# Patient Record
Sex: Male | Born: 1935 | Race: White | Hispanic: No | Marital: Married | Smoking: Former smoker
Health system: Southern US, Community
[De-identification: ages and names within clinical notes are randomized; demographics above are authoritative.]

## PROBLEM LIST (undated history)

## (undated) DIAGNOSIS — E785 Hyperlipidemia, unspecified: Secondary | ICD-10-CM

## (undated) DIAGNOSIS — N400 Enlarged prostate without lower urinary tract symptoms: Secondary | ICD-10-CM

## (undated) DIAGNOSIS — M170 Bilateral primary osteoarthritis of knee: Secondary | ICD-10-CM

## (undated) DIAGNOSIS — J449 Chronic obstructive pulmonary disease, unspecified: Secondary | ICD-10-CM

## (undated) DIAGNOSIS — E11311 Type 2 diabetes mellitus with unspecified diabetic retinopathy with macular edema: Secondary | ICD-10-CM

## (undated) DIAGNOSIS — M1990 Unspecified osteoarthritis, unspecified site: Secondary | ICD-10-CM

## (undated) DIAGNOSIS — R0989 Other specified symptoms and signs involving the circulatory and respiratory systems: Secondary | ICD-10-CM

## (undated) DIAGNOSIS — M25561 Pain in right knee: Secondary | ICD-10-CM

## (undated) DIAGNOSIS — M25562 Pain in left knee: Secondary | ICD-10-CM

## (undated) DIAGNOSIS — E113299 Type 2 diabetes mellitus with mild nonproliferative diabetic retinopathy without macular edema, unspecified eye: Secondary | ICD-10-CM

## (undated) DIAGNOSIS — Z85828 Personal history of other malignant neoplasm of skin: Secondary | ICD-10-CM

## (undated) DIAGNOSIS — E119 Type 2 diabetes mellitus without complications: Secondary | ICD-10-CM

## (undated) DIAGNOSIS — J310 Chronic rhinitis: Secondary | ICD-10-CM

## (undated) DIAGNOSIS — R931 Abnormal findings on diagnostic imaging of heart and coronary circulation: Secondary | ICD-10-CM

## (undated) DIAGNOSIS — N423 Unspecified dysplasia of prostate: Secondary | ICD-10-CM

## (undated) DIAGNOSIS — I6523 Occlusion and stenosis of bilateral carotid arteries: Secondary | ICD-10-CM

## (undated) DIAGNOSIS — I10 Essential (primary) hypertension: Secondary | ICD-10-CM

## (undated) DIAGNOSIS — Z961 Presence of intraocular lens: Secondary | ICD-10-CM

## (undated) DIAGNOSIS — R609 Edema, unspecified: Secondary | ICD-10-CM

## (undated) HISTORY — PX: COLECTOMY: SHX59

## (undated) HISTORY — PX: ANKLE SURGERY: SHX546

## (undated) HISTORY — PX: COLON SURGERY: SHX602

---

## 2012-07-18 ENCOUNTER — Emergency Department: Payer: Self-pay | Admitting: Internal Medicine

## 2012-07-18 LAB — URINALYSIS, COMPLETE
Bilirubin,UR: NEGATIVE
Blood: NEGATIVE
Glucose,UR: NEGATIVE mg/dL (ref 0–75)
Nitrite: NEGATIVE
Ph: 7 (ref 4.5–8.0)
Protein: NEGATIVE
RBC,UR: 1 /HPF (ref 0–5)
Specific Gravity: 1.013 (ref 1.003–1.030)
Squamous Epithelial: NONE SEEN
WBC UR: 7 /HPF (ref 0–5)

## 2012-07-18 LAB — COMPREHENSIVE METABOLIC PANEL
Anion Gap: 7 (ref 7–16)
Bilirubin,Total: 0.2 mg/dL (ref 0.2–1.0)
Chloride: 105 mmol/L (ref 98–107)
Creatinine: 1.33 mg/dL — ABNORMAL HIGH (ref 0.60–1.30)
EGFR (African American): 60 — ABNORMAL LOW
Glucose: 89 mg/dL (ref 65–99)
Osmolality: 287 (ref 275–301)
SGPT (ALT): 21 U/L (ref 12–78)
Sodium: 141 mmol/L (ref 136–145)

## 2012-07-18 LAB — CBC
MCV: 95 fL (ref 80–100)
Platelet: 174 10*3/uL (ref 150–440)
RDW: 12.6 % (ref 11.5–14.5)
WBC: 11 10*3/uL — ABNORMAL HIGH (ref 3.8–10.6)

## 2012-07-18 LAB — CK TOTAL AND CKMB (NOT AT ARMC): CK-MB: 1.9 ng/mL (ref 0.5–3.6)

## 2015-04-01 ENCOUNTER — Emergency Department: Payer: Medicare Other

## 2015-04-01 ENCOUNTER — Encounter: Payer: Self-pay | Admitting: Emergency Medicine

## 2015-04-01 ENCOUNTER — Emergency Department
Admission: EM | Admit: 2015-04-01 | Discharge: 2015-04-02 | Disposition: A | Discharge status: Home / Self Care | Payer: Medicare Other | Attending: Emergency Medicine | Admitting: Emergency Medicine

## 2015-04-01 ENCOUNTER — Emergency Department
Admission: EM | Admit: 2015-04-01 | Discharge: 2015-04-01 | Disposition: A | Discharge status: Home / Self Care | Payer: Medicare Other | Source: Home / Self Care | Attending: Emergency Medicine | Admitting: Emergency Medicine

## 2015-04-01 DIAGNOSIS — Y998 Other external cause status: Secondary | ICD-10-CM | POA: Diagnosis not present

## 2015-04-01 DIAGNOSIS — Z87891 Personal history of nicotine dependence: Secondary | ICD-10-CM | POA: Insufficient documentation

## 2015-04-01 DIAGNOSIS — Y9389 Activity, other specified: Secondary | ICD-10-CM | POA: Diagnosis not present

## 2015-04-01 DIAGNOSIS — R531 Weakness: Secondary | ICD-10-CM | POA: Diagnosis present

## 2015-04-01 DIAGNOSIS — S3992XA Unspecified injury of lower back, initial encounter: Secondary | ICD-10-CM | POA: Insufficient documentation

## 2015-04-01 DIAGNOSIS — S0990XA Unspecified injury of head, initial encounter: Secondary | ICD-10-CM | POA: Diagnosis not present

## 2015-04-01 DIAGNOSIS — E1165 Type 2 diabetes mellitus with hyperglycemia: Secondary | ICD-10-CM | POA: Insufficient documentation

## 2015-04-01 DIAGNOSIS — F0390 Unspecified dementia without behavioral disturbance: Secondary | ICD-10-CM

## 2015-04-01 DIAGNOSIS — R410 Disorientation, unspecified: Secondary | ICD-10-CM | POA: Diagnosis not present

## 2015-04-01 DIAGNOSIS — M545 Low back pain, unspecified: Secondary | ICD-10-CM

## 2015-04-01 DIAGNOSIS — W1839XA Other fall on same level, initial encounter: Secondary | ICD-10-CM | POA: Diagnosis not present

## 2015-04-01 DIAGNOSIS — Y9289 Other specified places as the place of occurrence of the external cause: Secondary | ICD-10-CM | POA: Diagnosis not present

## 2015-04-01 DIAGNOSIS — I10 Essential (primary) hypertension: Secondary | ICD-10-CM | POA: Diagnosis not present

## 2015-04-01 DIAGNOSIS — E113219 Type 2 diabetes mellitus with mild nonproliferative diabetic retinopathy with macular edema, unspecified eye: Secondary | ICD-10-CM | POA: Diagnosis not present

## 2015-04-01 DIAGNOSIS — R739 Hyperglycemia, unspecified: Secondary | ICD-10-CM

## 2015-04-01 HISTORY — DX: Other specified symptoms and signs involving the circulatory and respiratory systems: R09.89

## 2015-04-01 HISTORY — DX: Bilateral primary osteoarthritis of knee: M17.0

## 2015-04-01 HISTORY — DX: Chronic obstructive pulmonary disease, unspecified: J44.9

## 2015-04-01 HISTORY — DX: Type 2 diabetes mellitus without complications: E11.9

## 2015-04-01 HISTORY — DX: Pain in right knee: M25.561

## 2015-04-01 HISTORY — DX: Essential (primary) hypertension: I10

## 2015-04-01 HISTORY — DX: Unspecified osteoarthritis, unspecified site: M19.90

## 2015-04-01 HISTORY — DX: Occlusion and stenosis of bilateral carotid arteries: I65.23

## 2015-04-01 HISTORY — DX: Type 2 diabetes mellitus with mild nonproliferative diabetic retinopathy without macular edema, unspecified eye: E11.3299

## 2015-04-01 HISTORY — DX: Edema, unspecified: R60.9

## 2015-04-01 HISTORY — DX: Abnormal findings on diagnostic imaging of heart and coronary circulation: R93.1

## 2015-04-01 HISTORY — DX: Unspecified dysplasia of prostate: N42.30

## 2015-04-01 HISTORY — DX: Type 2 diabetes mellitus with unspecified diabetic retinopathy with macular edema: E11.311

## 2015-04-01 HISTORY — DX: Chronic rhinitis: J31.0

## 2015-04-01 HISTORY — DX: Pain in left knee: M25.562

## 2015-04-01 HISTORY — DX: Personal history of other malignant neoplasm of skin: Z85.828

## 2015-04-01 HISTORY — DX: Presence of intraocular lens: Z96.1

## 2015-04-01 HISTORY — DX: Benign prostatic hyperplasia without lower urinary tract symptoms: N40.0

## 2015-04-01 HISTORY — DX: Hyperlipidemia, unspecified: E78.5

## 2015-04-01 LAB — COMPREHENSIVE METABOLIC PANEL
ALBUMIN: 3.5 g/dL (ref 3.5–5.0)
ALT: 16 U/L — ABNORMAL LOW (ref 17–63)
AST: 20 U/L (ref 15–41)
Alkaline Phosphatase: 98 U/L (ref 38–126)
Anion gap: 10 (ref 5–15)
BUN: 29 mg/dL — AB (ref 6–20)
CO2: 29 mmol/L (ref 22–32)
Calcium: 9.1 mg/dL (ref 8.9–10.3)
Chloride: 97 mmol/L — ABNORMAL LOW (ref 101–111)
Creatinine, Ser: 1.5 mg/dL — ABNORMAL HIGH (ref 0.61–1.24)
GFR calc Af Amer: 49 mL/min — ABNORMAL LOW (ref >60)
GFR calc non Af Amer: 43 mL/min — ABNORMAL LOW (ref >60)
GLUCOSE: 519 mg/dL — AB (ref 65–99)
Potassium: 4.2 mmol/L (ref 3.5–5.1)
SODIUM: 136 mmol/L (ref 135–145)
Total Bilirubin: 1.2 mg/dL (ref 0.3–1.2)
Total Protein: 7.6 g/dL (ref 6.5–8.1)

## 2015-04-01 LAB — GLUCOSE, CAPILLARY
GLUCOSE-CAPILLARY: 274 mg/dL — AB (ref 65–99)
GLUCOSE-CAPILLARY: 315 mg/dL — AB (ref 65–99)

## 2015-04-01 LAB — URINALYSIS COMPLETE WITH MICROSCOPIC (ARMC ONLY)
BILIRUBIN URINE: NEGATIVE
Bacteria, UA: NONE SEEN
LEUKOCYTES UA: NEGATIVE
NITRITE: NEGATIVE
Protein, ur: >500 mg/dL — AB
SPECIFIC GRAVITY, URINE: 1.021 (ref 1.005–1.030)
Squamous Epithelial / LPF: NONE SEEN
pH: 6 (ref 5.0–8.0)

## 2015-04-01 LAB — CBC
HCT: 40 % (ref 40.0–52.0)
Hemoglobin: 12.7 g/dL — ABNORMAL LOW (ref 13.0–18.0)
MCH: 30.2 pg (ref 26.0–34.0)
MCHC: 31.7 g/dL — ABNORMAL LOW (ref 32.0–36.0)
MCV: 95.3 fL (ref 80.0–100.0)
Platelets: 230 10*3/uL (ref 150–440)
RBC: 4.19 MIL/uL — ABNORMAL LOW (ref 4.40–5.90)
RDW: 13.6 % (ref 11.5–14.5)
WBC: 11.6 10*3/uL — AB (ref 3.8–10.6)

## 2015-04-01 MED ORDER — INSULIN ASPART 100 UNIT/ML ~~LOC~~ SOLN
10.0000 [IU] | Freq: Once | SUBCUTANEOUS | Status: AC
  Administered 2015-04-01: 10 [IU] via INTRAVENOUS
  Filled 2015-04-01: qty 10

## 2015-04-01 MED ORDER — SODIUM CHLORIDE 0.9 % IV BOLUS (SEPSIS): 1000.0000 mL | Freq: Once | INTRAVENOUS | Status: DC

## 2015-04-01 MED ORDER — SODIUM CHLORIDE 0.9 % IV BOLUS (SEPSIS)
1000.0000 mL | Freq: Once | INTRAVENOUS | Status: AC
  Administered 2015-04-01: 1000 mL via INTRAVENOUS

## 2015-04-01 NOTE — ED Notes (Signed)
Pt's wife at bedside. Per patient's wife pt sat on the toilet all night. Pt's wife states approx 1 yr ago he went in for surgery and "this big can of worms was opened". Pt's wife states that pt was dx with dementia and since then "has gone down hill". Pt's wife states that he has not had any of his medications because she has not been giving them to him, wife states "he was a very private man who had his own routines". Pt's wife also states that patient is incontinent but refuses to wear depends. Pt calm and cooperative at this time.

## 2015-04-01 NOTE — ED Notes (Signed)
Pt presents via [facility]. Per EMS they were called out for a failure to thrive and altered mental status. EMS states when they arrived pt was sitting on toilet. EMS states that per wife pt has been there all night and has had worsening dementia. Pt is a poor historian at this time. Pt states that he was on the toilet "for a while". Pt has 18G L AC, CBG 488. EMS states that pt's wife told her that he had hx of dementia but was able to take all of his own meds.

## 2015-04-01 NOTE — ED Provider Notes (Signed)
[facility] Emergency Department Provider Note   ____________________________________________  Time seen: Approximately 10 PM I have reviewed the triage vital signs and the triage nursing note.  HISTORY  Chief Complaint Weakness   Historian Limited, patient is poor historian due to dementia Wife is a poor historian but provided most of the history Friend who is a Engineer, civil (consulting) provided some history is wall  HPI Patrick Morrison is a 82 y.o. male with a history of dementia, which has been treated recently to medication noncompliance and hyperglycemia due to not taking his medications correctly, is here for the second visit of the day for what sounds like inability to walk due to back pain.  The patient was seen this morning for hyperglycemia and laboratory studies were reassuring, and after treatment glucose was down and patient was sent home with his wife and was supposed to schedule home health nurse visit tomorrow.   When the patient got home family was able to get him out of the car but he was virtually unable to stand on his own complaining of sounds like of low back pain. He was sitting in a lawn chair when their friend, a nurse, came over and attempted to help as well, but the patient was reporting severe pain when he tried to walk located probably to the low back. He does have dementia but it seemed that it was more pain and physical limitation that was stopping him being able to get into the house rather than the dementia or altered mental status.  He has had multiple falls over the past several weeks including at least 2 that did involve a head injury and he was not ever evaluated for those.  It's unclear to me whether or not there is actually a new change and altered mental status, or the dementia has been present crusting to the point where it wasn't obvious. However it is true that the multiple falls are new over the past 3 weeks.    Past Medical History   Diagnosis Date  . DM (diabetes mellitus) (HCC)   . BPH (benign prostatic hyperplasia)   . Dysplasia of prostate   . Chronic rhinitis   . Osteoarthritis   . Edema   . COPD (chronic obstructive pulmonary disease) (HCC)   . Hypertension   . Hyperlipidemia   . Diabetic macular edema (HCC)   . NPDR (nonproliferative diabetic retinopathy) (HCC)   . Pseudophakia of both eyes   . Bilateral knee pain   . Primary osteoarthritis of both knees   . Bruit of right carotid artery   . Abnormal echocardiogram   . Personal history of other malignant neoplasm of skin   . Asymptomatic bilateral carotid artery stenosis     There are no active problems to display for this patient.   Past Surgical History  Procedure Laterality Date  . Ankle surgery    . Colon surgery    . Colectomy      No current outpatient prescriptions on file.  Allergies Review of patient's allergies indicates no known allergies.  History reviewed. No pertinent family history.  Social History Social History  Substance Use Topics  . Smoking status: Former Smoker    Quit date: 11/24/2003  . Smokeless tobacco: Never Used  . Alcohol Use: No    Review of Systems  Constitutional: Negative for fever. Eyes: Negative for visual changes. ENT: Negative for sore throat. Cardiovascular: Negative for chest pain. Respiratory: Negative for shortness of breath. Gastrointestinal: Negative for  abdominal pain, vomiting and diarrhea. Genitourinary: Negative for dysuria. Musculoskeletal positive for back pain. Skin: Negative for rash. Neurological: Negative for headache. 10 point Review of Systems otherwise negative ____________________________________________   PHYSICAL EXAM:  VITAL SIGNS: ED Triage Vitals  Enc Vitals Group     BP 04/01/15 2055 186/77 mmHg     Pulse Rate 04/01/15 2055 100     Resp 04/01/15 2055 20     Temp 04/01/15 2055 97.8 F (36.6 C)     Temp Source 04/01/15 2055 Oral     SpO2 04/01/15 2053 99  %     Weight 04/01/15 2055 137 lb (62.143 kg)     Height 04/01/15 2055 5\' 6"  (1.676 m)     Head Cir --      [facility] Flow --      Pain Score 04/01/15 2056 6     Pain Loc --      Pain Edu? --      Excl. in GC? --      Constitutional: Alert and confused/disoriented. Poor historian.. Well appearing and in no distress. Eyes: Conjunctivae are normal. PERRL. Normal extraocular movements. ENT   Head: Normocephalic. Scab over nasal bridge.   Nose: No congestion/rhinnorhea.   Mouth/Throat: Mucous membranes are moist.   Neck: No stridor. Cardiovascular/Chest: Normal rate, regular rhythm.  No murmurs, rubs, or gallops. Respiratory: Normal respiratory effort without tachypnea nor retractions. Breath sounds are clear and equal bilaterally. No wheezes/rales/rhonchi. Gastrointestinal: Soft. No distention, no guarding, no rebound. Nontender.    Genitourinary/rectal:Deferred Musculoskeletal: No step-offs but moderate tenderness to palpation in the mid lumbar and bilateral paraspinous musculature of the low back.  Mild squinting with range of motion and palpation of the hips. . Neurologic: No slurred speech. No facial droop. No focal motor or sensory deficits appreciated. Skin:  Skin is warm, dry and intact. No rash noted. Psychiatric: Confused, but no evidence of hallucination.  ____________________________________________   EKG I, Governor Rooks, MD, the attending physician have personally viewed and interpreted all ECGs.  84 bpm. Normal sinus rhythm. Left axis deviation. Nonspecific T-wave ____________________________________________  LABS (pertinent positives/negatives)  None  ____________________________________________  RADIOLOGY All Xrays were viewed by me. Imaging interpreted by Radiologist.  Lumbar spine: IMPRESSION: 1. No acute abnormality within the lumbar spine. 2. Dextroscoliosis with mild to moderate multilevel degenerative disc disease, greatest at L2-3. 3.  Extensive aorto bi-iliac atherosclerotic disease.  Pelvis: Bony abnormalities noted in the pelvis.  CT head without contrast: No acute intracerebral process. Age-related cerebral atrophy with mild to moderate chronic small vessel ischemic disease.  CT pelvis: Pending __________________________________________  PROCEDURES  Procedure(s) performed: None  Critical Care performed: None  ____________________________________________   ED COURSE / ASSESSMENT AND PLAN  CONSULTATIONS: Placed consultations for physical therapy and social work which will occur tomorrow.  Pertinent labs & imaging results that were available during my care of the patient were reviewed by me and considered in my medical decision making (see chart for details).  I reviewed this patient's chart including labs and physician note from earlier today, and he was treated for hyperglycemia with a plan to have home health started to help educate the wife on how to do the patient's medications as he does have some confusion and dementia.  However, it sounds like once he got home it was more obvious to the friend/nurse that the patient not only has had multiple falls recently, but he seems to be having a significant amount of low back pain or pelvis pain  that is preventing him from standing and walking.  So tonight, I am going to CT his head, and x-ray his spine and pelvis.. Given his current physical limitations and that he can hardly stand and certainly can't walk, including significant/severe dementia and inability to take his own medications, he is going to need evaluation for placement at his he is unsafe and unfit to be at home. I have placed social work and physical therapy consults which will occur tomorrow.   Patient care transferred to Dr. Manson PasseyBrown overnight at 11:30 PM. Patient will be here overnight and to the morning in order to see social work and physical therapy.  Patient / Family / Caregiver informed of  clinical course, medical decision-making process, and agree with plan.     ___________________________________________   FINAL CLINICAL IMPRESSION(S) / ED DIAGNOSES   Final diagnoses:  Dementia, without behavioral disturbance  Acute lumbar back pain              Note: This dictation was prepared with Dragon dictation. Any transcriptional errors that result from this process are unintentional   Governor Rooksebecca Alexi Dorminey, MD 04/01/15 [phone]

## 2015-04-01 NOTE — ED Provider Notes (Addendum)
[facility] Emergency Department Provider Note  Time seen: 11:11 AM  I have reviewed the triage vital signs and the nursing notes.   HISTORY  Chief Complaint No chief complaint on file.    HPI Patrick Morrison is a 82 y.o. male with a past medical history of dementia, diabetes presents to the emergency departmentfor wellness check. According to EMS report the patient has an enlarged prostate, was having frequent urination overnight. Per report the wife left him on the toilet overnight the patient could not get up by himself. They called EMS today for evaluation. Per EMS the wife states that the patient has become increasingly demented and difficult to handle at home now with increased urination as well. The patient is alert, oriented to self, location, and situation, states he was not sitting on the toilet all night, states that he had an accident due to his increased urination and his wife got mad at him and called EMS to get him out of the house. Patient denies any abdominal pain, chest pain, fever, nausea, vomiting, diarrhea. Patient has no complaints at this time. Patient's fingerstick was found to be elevated, the patient states he doses his own diabetes medications but is not sure if he has been taking them.     No past medical history on file.  There are no active problems to display for this patient.   No past surgical history on file.  No current outpatient prescriptions on file.  Allergies Review of patient's allergies indicates not on file.  No family history on file.  Social History Social History  Substance Use Topics  . Smoking status: Not on file  . Smokeless tobacco: Not on file  . Alcohol Use: Not on file    Review of Systems Constitutional: Negative for fever. Cardiovascular: Negative for chest pain. Respiratory: Negative for shortness of breath. Gastrointestinal: Negative for abdominal pain Genitourinary: Negative for dysuria.  Positive for increased urinary frequency Musculoskeletal: Negative for back pain Neurological: Negative for headache 10-point ROS otherwise negative.  ____________________________________________   PHYSICAL EXAM:  Constitutional: Alert, oriented to person, place, situation but not to time, is able to tell me January but not the correct year. Eyes: Normal exam ENT   Head: Normocephalic and atraumatic.   Mouth/Throat: Mucous membranes are moist. Cardiovascular: Normal rate, regular rhythm. No murmur Respiratory: Normal respiratory effort without tachypnea nor retractions. Breath sounds are clear and equal bilaterally. No wheezes/rales/rhonchi. Gastrointestinal: Soft and nontender. No distention. Musculoskeletal: Nontender with normal range of motion in all extremities.  Neurologic:  Normal speech and language. No gross focal neurologic deficits Skin:  Skin is warm, dry and intact.  Psychiatric: Mood and affect are normal. Speech and behavior are normal.  ____________________________________________     INITIAL IMPRESSION / ASSESSMENT AND PLAN / ED COURSE  Pertinent labs & imaging results that were available during my care of the patient were reviewed by me and considered in my medical decision making (see chart for details).  Patient presents by EMS for possible increased confusion, increased urinary frequency, patient denies any complaints. States he had an accident overnight due to his urinary frequency and believes that his wife was upset with him and called EMS. It is not clear at this time if this is true or not. Per EMS the wife stated the patient was on the commode all night as he could not get off by himself. We will check labs, urinalysis, and closely monitor in the emergency department. Patient's finger sig  blood glucose was found to be quite elevated, the patient has a history of diabetes and states he takes his own medications. The patient has a history of dementia,  and he states he cannot remember. Been taking his medications or not.  Labs within normal limits besides the hyperglycemia, urinalysis is negative. Receiving insulin and fluids at this time. Case management is currently speaking with the patient's family to help arrange for home health care. Wife is agreeable to this plan.  Patient's blood glucose has decreased to 270. Case management has discussed the options with the patient in the family, they wish to bring the patient home in use the resources provided by case management to obtain home health care. I believe this is a reasonable option given there does not appear to be any acute change more of a progressive decline for the past 3 months per the wife.  ____________________________________________   FINAL CLINICAL IMPRESSION(S) / ED DIAGNOSES  Hyperglycemia Urinary frequency Dementia  Minna Antis, MD 04/01/15 1508  Minna Antis, MD 04/01/15 [phone]

## 2015-04-01 NOTE — Discharge Instructions (Signed)
Please follow-up with the resources provided to you by case management. Please also follow-up with your primary care physician as soon as possible to discuss further care including home health care. Return to the emergency department for any personally concerning symptoms.    Hyperglycemia High blood sugar (hyperglycemia) means that the level of sugar in your blood is higher than it should be. Signs of high blood sugar include:  Feeling thirsty.  Frequent peeing (urinating).  Feeling tired or sleepy.  Dry mouth.  Vision changes.  Feeling weak.  Feeling hungry but losing weight.  Numbness and tingling in your hands or feet.  Headache. When you ignore these signs, your blood sugar may keep going up. These problems may get worse, and other problems may begin. HOME CARE  Check your blood sugars as told by your doctor. Write down the numbers with the date and time.  Take the right amount of insulin or diabetes pills at the right time. Write down the dose with date and time.  Refill your insulin or diabetes pills before running out.  Watch what you eat. Follow your meal plan.  Drink liquids without sugar, such as water. Check with your doctor if you have kidney or heart disease.  Follow your doctor's orders for exercise. Exercise at the same time of day.  Keep your doctor's appointments. GET HELP RIGHT AWAY IF:   You have trouble thinking or are confused.  You have fast breathing with fruity smelling breath.  You pass out (faint).  You have 2 to 3 days of high blood sugars and you do not know why.  You have chest pain.  You are feeling sick to your stomach (nauseous) or throwing up (vomiting).  You have sudden vision changes. MAKE SURE YOU:   Understand these instructions.  Will watch your condition.  Will get help right away if you are not doing well or get worse.   This information is not intended to replace advice given to you by your health care provider.  Make sure you discuss any questions you have with your health care provider.   Document Released: 01/02/2009 Document Revised: 03/28/2014 Document Reviewed: 11/11/2014 Elsevier Interactive Patient Education 2016 [employer].  Dementia Dementia is a word that is used to describe problems with the brain and how it works. People with dementia have memory loss. They may also have problems with thinking, speaking, or solving problems. It can affect how they act around people, how they do their job, their mood, and their personality. These changes may not show up for a long time. Family or friends may not notice problems in the early part of this disease. HOME CARE The following tips are for the person living with, or caring for, the person with dementia. Make the home safe.  Remove locks on bathroom doors.  Use childproof locks on cabinets where alcohol, cleaning supplies, or chemicals are stored.  Put outlet covers in electrical outlets.  Put in childproof locks to keep doors and windows safe.  Remove stove knobs, or put in safety knobs that shut off on their own.  Lower the temperature on water heaters.  Label medicines. Lock them in a safe place.  Keep knives, lighters, matches, power tools, and guns out of reach or in a safe place.  Remove objects that might break or can hurt the person.  Make sure lighting is good inside and outside.  Put in grab bars if needed.  Use a device that detects falls or other needs  for help. Lessen confusion.  Keep familiar objects and people around.  Use night lights or low lit (dim) lights at night.  Label objects or areas.  Use reminders, notes, or directions for daily activities or tasks.  Keep a simple routine that is the same for waking, meals, bathing, dressing, and bedtime.  Create a calm and quiet home.  Put up clocks and calendars.  Keep emergency numbers and the home address near all phones.  Help show the different times of  day. Open the curtains during the day to let light in. Speak clearly and directly.  Choose simple words and short sentences.  Use a gentle, calm voice.  Do not interrupt.  If the person has a hard time finding a word to use, give them the word or thought.  Ask 1 question at a time. Give enough time for the person to answer. Repeat the question if the person does not answer. Do things that lessen restlessness.  Provide a comfortable bed.  Have the same bedtime routine every night.  Have a regular walking and activity schedule.  Lessen naps during the day.  Do not let the person drink a lot of caffeine.  Go to events that are not overwhelming. Eat well and drink fluids.  Lessen distractions during meal times and snacks.  Avoid foods that are too hot or too cold.  Watch how the person chews and swallows. This is to make sure they do not choke. Other  Keep all vision, hearing, dental, and medical visits with the doctor.  Only give medicines as told by the doctor.  Watch the person's driving ability. Do not let the person drive if he or she cannot drive safely.  Use a program that helps find a person if they become missing. You may need to register with this program. GET HELP RIGHT AWAY IF:   A fever of 102 F (38.9 C) develops.  Confusion develops or gets worse.  Sleepiness develops or gets worse.  Staying awake is hard to do.  New behavior problems start like mood swings, aggression, and seeing things that are not there.  Problems with balance, speech, or falling develop.  Problems swallowing develop.  Any problems of another sickness develop. MAKE SURE YOU:  Understand these instructions.  Will watch his or her condition.  Will get help right away if he or she is not doing well or gets worse.   This information is not intended to replace advice given to you by your health care provider. Make sure you discuss any questions you have with your health care  provider.   Document Released: 02/18/2008 Document Revised: 05/30/2011 Document Reviewed: 08/02/2010 Elsevier Interactive Patient Education [employer] Elsevier Inc.

## 2015-04-01 NOTE — Care Management Note (Signed)
Case Management Note  Patient Details  Name: [name] MRN: [id number] Date of Birth: [birth year]  Subjective/Objective:         Spoke at length to son and spouse.They want to keep the patient at home as long as possible. I have provided them with our approved lists for [facility], as well as personal care services. They choose not to decide right now. I have told them to call their PCP, and tell him the agency they want to go with, as well as given them information for [facility], and [facility]. They have no further questions at this time. The son says he and his 3 brothers will help [name] through this. They have a plan to look at facilities together if this does not work out.           Action/Plan:   Expected Discharge Date:                  Expected Discharge Plan:     In-House Referral:     Discharge planning Services     Post Acute Care Choice:    Choice offered to:     DME Arranged:    DME Agency:     HH Arranged:    HH Agency:     Status of Service:     Medicare Important Message Given:    Date Medicare IM Given:    Medicare IM give by:    Date Additional Medicare IM Given:    Additional Medicare Important Message give by:     If discussed at Long Length of Stay Meetings, dates discussed:    Additional Comments:  Berna BueCheryl Iliyah Bui, RN 04/01/2015, 3:23 PM

## 2015-04-01 NOTE — ED Notes (Signed)
ems pt from home was seen here this morning for elevated blood sugar and dc'd home pt was not able to get back into the house from the car. pt also fell x 2 last week and was never evaluated for injuries

## 2015-04-01 NOTE — ED Notes (Signed)
Pt's family requests bed bath at this time.

## 2015-04-02 LAB — C DIFFICILE QUICK SCREEN W PCR REFLEX
C DIFFICILE (CDIFF) TOXIN: NEGATIVE
C Diff antigen: NEGATIVE
C Diff interpretation: NEGATIVE

## 2015-04-02 LAB — GLUCOSE, CAPILLARY
GLUCOSE-CAPILLARY: 416 mg/dL — AB (ref 65–99)
GLUCOSE-CAPILLARY: 372 mg/dL — AB (ref 65–99)
GLUCOSE-CAPILLARY: 324 mg/dL — AB (ref 65–99)
Glucose-Capillary: 285 mg/dL — ABNORMAL HIGH (ref 65–99)

## 2015-04-02 MED ORDER — INSULIN GLARGINE 100 UNIT/ML ~~LOC~~ SOLN
5.0000 [IU] | Freq: Every day | SUBCUTANEOUS | Status: DC
  Administered 2015-04-02: 5 [IU] via SUBCUTANEOUS
  Filled 2015-04-02 (×2): qty 0.05

## 2015-04-02 MED ORDER — INSULIN ASPART 100 UNIT/ML ~~LOC~~ SOLN
5.0000 [IU] | Freq: Once | SUBCUTANEOUS | Status: DC
Start: 2015-04-02 — End: 2015-04-02

## 2015-04-02 NOTE — ED Notes (Addendum)
Patrick Morrison, with Social work [facility]. [facility] phone number attached to patients discharge packet for report to be given.  EMS will be source of transportation [facility].

## 2015-04-02 NOTE — ED Notes (Signed)
Pt had second episode of loose stool, pt cleaned and EDP made aware

## 2015-04-02 NOTE — ED Notes (Signed)
EMS arrived to take [facility] to [facility].

## 2015-04-02 NOTE — [place] FL2 (Cosign Needed)
[place] MEDICAID FL2 LEVEL OF CARE SCREENING TOOL     IDENTIFICATION  Patient Name: Patrick Morrison Birthdate: Oct 23, 1933 Sex: male Admission Date (Current Location): 04/01/2015  [place]   Facility and Address:  [facility], [address], [place], [place] [address]      Provider Number: [id number]  Attending Physician Name and Address:  No att. providers found  Relative Name and Phone Number:       Current Level of Care: Hospital Recommended Level of Care: Skilled Nursing Facility Prior Approval Number:    Date Approved/Denied:   PASRR Number:  [id number])  Discharge Plan: SNF    Current Diagnoses: There are no active problems to display for this patient.   Orientation RESPIRATION BLADDER Height & Weight    Self, Time, Situation, Place  Normal Continent 5\' 6"  (167.6 cm) 137 lbs.  BEHAVIORAL SYMPTOMS/MOOD NEUROLOGICAL BOWEL NUTRITION STATUS   (None)  (None) Continent Diet (Heart )  AMBULATORY STATUS COMMUNICATION OF NEEDS Skin   Extensive Assist Verbally Normal                       Personal Care Assistance Level of Assistance  Bathing, Dressing, Feeding Bathing Assistance: Limited assistance Feeding assistance: Independent Dressing Assistance: Limited assistance     Functional Limitations Info  Sight, Hearing, Speech Sight Info: Adequate Hearing Info: Adequate Speech Info: Adequate    SPECIAL CARE FACTORS FREQUENCY  PT (By licensed PT)     PT Frequency:  (5)              Contractures      Additional Factors Info  Allergies, Isolation Precautions   Allergies Info:  (No Known Allergies )     Isolation Precautions Info:  (Enteric precautions )     Current Medications (04/02/2015):  This is Patrick current hospital active medication list Current Facility-Administered Medications  Medication Dose Route Frequency Provider Last Rate Last Dose  . insulin glargine (LANTUS) injection  5 Units  5 Units Subcutaneous Daily Arnaldo NatalPaul F Malinda, MD   5 Units at 04/02/15 1102   Current Outpatient Prescriptions  Medication Sig Dispense Refill  . ADVAIR DISKUS 250-50 MCG/DOSE AEPB Take 1 puff by mouth 2 (two) times daily.  2  . fluticasone (FLONASE) 50 MCG/ACT nasal spray Place 2 sprays into both nostrils daily.   2  . hydrochlorothiazide (HYDRODIURIL) 25 MG tablet Take 1 tablet by mouth daily.  2  . insulin glargine (LANTUS) 100 UNIT/ML injection Inject 8 Units into Patrick skin at bedtime.    . insulin lispro (HUMALOG) 100 UNIT/ML KiwkPen Inject 5-8 Units into Patrick skin 2 (two) times daily.    Marland Kitchen. lisinopril (PRINIVIL,ZESTRIL) 40 MG tablet Take 1 tablet by mouth daily.  2  . Melatonin 3 MG TABS Take 1 tablet by mouth at bedtime.    Marland Kitchen. oxybutynin (DITROPAN-XL) 5 MG 24 hr tablet Take 1 tablet by mouth daily.  0  . simvastatin (ZOCOR) 20 MG tablet Take 1 tablet by mouth daily at 6 PM.   2  . terazosin (HYTRIN) 10 MG capsule Take 1 capsule by mouth daily.  2  . triamcinolone ointment (KENALOG) 0.1 % Apply 1 application topically 2 (two) times daily.   0     Discharge Medications: Please see discharge summary for a list of discharge medications.  Relevant Imaging Results:  Relevant Lab Results:   Additional Information  (SSN [id number])  Patrick Ellenhristina E Karlena Luebke, LCSW

## 2015-04-02 NOTE — ED Notes (Signed)
Jom[facility] Long[facility] came by to speak with patient and patient's wife.  Wife has accepted a bed at the facility but they are still waiting on insurance approval.  Contact information for Jomarie LongsJoseph has been left at the patient's bedside.

## 2015-04-02 NOTE — Clinical Social Work Note (Signed)
Clinical Social Work Assessment  Patient Details  Name: Patrick Morrison Morrison MRN: [id number] Date of Birth: 09-01-1933  Date of referral:  04/02/15               Reason for consult:  Discharge Planning                Permission sought to share information with:  Family Supports Permission granted to share information::  Yes, Verbal Permission Granted  Name::        Agency::     Relationship::   Patrick Morrison Morrison [place] [phone]))  Contact Information:     Housing/Transportation Living arrangements for the past 2 months:  Skilled Nursing Facility, Single Family Home Source of Information:  Spouse, Facility Patient Interpreter Needed:  None Criminal Activity/Legal Involvement Pertinent to Current Situation/Hospitalization:  No - Comment as needed Significant Relationships:  Spouse Lives with:  Spouse Do you feel safe going back to the place where you live?  Yes Need for family participation in patient care:  Yes (Comment) Patrick Morrison Morrison [place] [phone]))  Care giving concerns: PT recommended SNF placement for patient.    Social Worker assessment / plan: CSW contacted patient's wife over the phone (Patrick Morrison Morrison [phone]) due to patient being confused. Per Patrick Morrison Morrison, she's having a difficult time caring for patient and managing his diabetes. She reports that PT informed her that patient needs SNF placement. Patrick [facility]. [facility] and spoke to their adminstration. CSW explained [employer] authorization process and discussed plans if [facility] does not authorize patient for SNF [facility]). Patrick Morrison Morrison reports that she isn't "comfortable" with taking patient. CSW explained to patient if [facility] declines and she still wants patient to go to SNF she would be assessed a $7,000- $8,000 per month fee where the 1st month's fee will be due on day #1 of SNF placement. Patrick Morrison Morrison reports she understands. She stated that patient will not go to SNF if [facility] does not approve and she  was agreeable to patient being referred to SNFs in [facility].   CSW completed FL2 & PASRR. Faxed patient to SNFs in [facility]. Sent [employer] clinicals for authorization. CSW is awaiting authorization determination from [facility].   CSW received a bed offer from [facility]. CSW informed patient's wife Patrick Morrison Morrison that [facility] offered a bed pending [facility] Medicare authorization. Patrick Morrison accepted [facility] bed offer pending [employer] authorization. CSW received a call from Baxter, admissions coordinator at [facility]. She informed CSW that patient was recently a resident at [facility] for STR in December 2016. CSW requested discharge dates and length of patient's stay at [facility]. Patrick Morrison Morrison informed CSW that she will look up information and call CSW back.   CSW will continue to follow and assist.   Employment status:  Retired Health and safety inspector:  Medicare PT Recommendations:  Skilled Nursing Facility Information / Referral to community resources:  Skilled Nursing Facility  Patient/Family's Response to care:  Patient's wife Patrick Morrison Morrison is agreeable to SNF search in [place].   Patient/Family's Understanding of and Emotional Response to Diagnosis, Current Treatment, and Prognosis:  Patient's wife, Patrick Morrison Morrison reports that she feels that she cannot care for patient at this time. She stated she wants patient to go to SNF but could not afford SNF placement if [facility] does not approve. Patient's wife reports she understands options for discharge if [facility] Medicare decline's patient for SNF placement.   Emotional Assessment Appearance:   (Unable to assess ) Attitude/Demeanor/Rapport:   (Patient is  confused ) Affect (typically observed):  Unable to Assess Orientation:  Oriented to Self, Oriented to  Time, Oriented to Place Alcohol / Substance use:  Not Applicable Psych involvement (Current and /or in the community):  No (Comment)  Discharge Needs  Concerns to be addressed:  Discharge Planning Concerns Readmission  within the last 30 days:  No Current discharge risk:  Chronically ill Barriers to Discharge:  Continued Medical Work up   WalgreenChristina E Cassadie Pankonin, LCSW 04/02/2015, 12:15 PM

## 2015-04-02 NOTE — Progress Notes (Signed)
[employer] authorization received for SNF placement. Auth # [id number]. [facility] is going to [facility] today. Per [facility] liaison patient is going to room 611. RN will call report to RN Kim Hicks at [phone] and arrange EMS for transport. MD signed FL2, which was placed in packet. CSW contacted patient's wife [name] and made her aware of above. Wife is in agreement with plan. CSW met with patient and made him aware of above. Patient is in agreement with plan. Please reconsult if future social work needs arise. CSW signing off.    Morgan, LCSWA [phone] 

## 2015-04-02 NOTE — ED Notes (Signed)
Pt awakens easily.  Denies pain and answers appropriate.  disorieted to place and time.

## 2015-04-02 NOTE — NC FL2 (Cosign Needed)
 MEDICAID FL2 LEVEL OF CARE SCREENING TOOL     IDENTIFICATION  Patient Name: Patrick Morrison Birthdate: 09/05/1933 Sex: male Admission Date (Current Location): 04/01/2015  County and Medicaid Number:  [place]   Facility and Address:  [facility], [address], [place], [place] [address]      Provider Number: [id number]  Attending Physician Name and Address:  No att. providers found  Relative Name and Phone Number:       Current Level of Care: Hospital Recommended Level of Care: Skilled Nursing Facility Prior Approval Number:    Date Approved/Denied:   PASRR Number:  ([id number])  Discharge Plan: SNF    Current Diagnoses: There are no active problems to display for this patient.   Orientation RESPIRATION BLADDER Height & Weight    Self, Time, Situation, Place  Normal Continent 5' 6" (167.6 cm) 137 lbs.  BEHAVIORAL SYMPTOMS/MOOD NEUROLOGICAL BOWEL NUTRITION STATUS   (None)  (None) Continent Diet (Heart )  AMBULATORY STATUS COMMUNICATION OF NEEDS Skin   Extensive Assist Verbally Normal                       Personal Care Assistance Level of Assistance  Bathing, Dressing, Feeding Bathing Assistance: Limited assistance Feeding assistance: Independent Dressing Assistance: Limited assistance     Functional Limitations Info  Sight, Hearing, Speech Sight Info: Adequate Hearing Info: Adequate Speech Info: Adequate    SPECIAL CARE FACTORS FREQUENCY  PT (By licensed PT)     PT Frequency:  (5)              Contractures      Additional Factors Info  Allergies, Isolation Precautions   Allergies Info:  (No Known Allergies )     Isolation Precautions Info:  (Enteric precautions )     Current Medications (04/02/2015):  This is the current hospital active medication list Current Facility-Administered Medications  Medication Dose Route Frequency Provider Last Rate Last Dose  . insulin aspart (novoLOG) injection 5  Units  5 Units Subcutaneous Once Paul F Malinda, MD      . insulin glargine (LANTUS) injection 5 Units  5 Units Subcutaneous Daily Paul F Malinda, MD   5 Units at 04/02/15 1102   Current Outpatient Prescriptions  Medication Sig Dispense Refill  . ADVAIR DISKUS 250-50 MCG/DOSE AEPB Take 1 puff by mouth 2 (two) times daily.  2  . fluticasone (FLONASE) 50 MCG/ACT nasal spray Place 2 sprays into both nostrils daily.   2  . hydrochlorothiazide (HYDRODIURIL) 25 MG tablet Take 1 tablet by mouth daily.  2  . insulin glargine (LANTUS) 100 UNIT/ML injection Inject 8 Units into the skin at bedtime.    . insulin lispro (HUMALOG) 100 UNIT/ML KiwkPen Inject 5-8 Units into the skin 2 (two) times daily.    . lisinopril (PRINIVIL,ZESTRIL) 40 MG tablet Take 1 tablet by mouth daily.  2  . Melatonin 3 MG TABS Take 1 tablet by mouth at bedtime.    . oxybutynin (DITROPAN-XL) 5 MG 24 hr tablet Take 1 tablet by mouth daily.  0  . simvastatin (ZOCOR) 20 MG tablet Take 1 tablet by mouth daily at 6 PM.   2  . terazosin (HYTRIN) 10 MG capsule Take 1 capsule by mouth daily.  2  . triamcinolone ointment (KENALOG) 0.1 % Apply 1 application topically 2 (two) times daily.   0     Discharge Medications: Please see discharge summary for a list of discharge medications.    Relevant Imaging Results:  Relevant Lab Results:   Additional Information  (SSN [id number])  Morgan, Jaylyn Iyer G, LCSW     

## 2015-04-02 NOTE — Clinical Social Work Placement (Signed)
   CLINICAL SOCIAL WORK PLACEMENT  NOTE  Date:  04/02/2015  Patient Details  Name: Patrick Morrison E Mcconahy MRN: [id number] Date of Birth: December 02, 1933  Clinical Social Work is seeking post-discharge placement for this patient at the Skilled  Nursing Facility level of care (*CSW will initial, date and re-position this form in  chart as items are completed):  Yes   Patient/family provided with [place] Clinical Social Work Department's list of facilities offering this level of care within the geographic area requested by the patient (or if unable, by the patient's family).  Yes   Patient/family informed of their freedom to choose among providers that offer the needed level of care, that participate in Medicare, Medicaid or managed care program needed by the patient, have an available bed and are willing to accept the patient.  Yes   Patient/family informed of [facility]'s ownership interest in [facility] and [facility], as well as of the fact that they are under no obligation to receive care at these facilities.  PASRR submitted to EDS on       PASRR number received on       Existing PASRR number confirmed on 04/02/15     FL2 transmitted to all facilities in geographic area requested by pt/family on 04/02/15     FL2 transmitted to all facilities within larger geographic area on       Patient informed that his/her managed care company has contracts with or will negotiate with certain facilities, including the following:        Yes   Patient/family informed of bed offers received.  [facility] bed at  ([facility] )     Physician recommends and patient chooses bed at      Patient to be transferred to  ([facility] ) on 04/02/15.  Patient [facility] facility [facility]  [facility]     Patient family notified on 04/02/15 of transfer.  Name of family member notified:   (Patient's wife Corrie DandyMary is aware of D/C today. )     PHYSICIAN       Additional Comment:     _______________________________________________ Haig ProphetMorgan, Graylee Arutyunyan G, LCSW 04/02/2015, 2:58 PM

## 2015-04-02 NOTE — Evaluation (Signed)
Physical Therapy Evaluation Patient Details Name: Patrick Morrison MRN: [id number] DOB: 30-Jan-1934 Today's Date: 04/02/2015   History of Present Illness  Pt is a 82 y.o. male presenting to hospital initially for FTT and AMS (pt found sitting on toilet at home for extended period of time and unable to get up) and found to have hyperglycemia and urinary frequency; pt discharged home from ED but returned to ED same day d/t unable to walk d/t back pain.  Per notes, pt with multiple falls recently.  CT of head negative.  Imaging of pelvis and L-spine negative for fx's.  PMH includes dementia and diabetes.  Clinical Impression  Prior to admission, pt reports being independent with functional mobility using SPC.  Pt reports living with his wife in 1 level home with level entry (pt appearing to be poor historian though and no family present to confirm PLOF or home set-up).  Pt denies any falls in past 6 months (although per chart pt has had multiple falls).  Currently pt is mod assist supine to sit; min to mod assist to stand with RW (pt with posterior lean upon standing requiring assist for balance); and min assist x1 (2nd assist for safety) ambulating 5 feet with RW (intermittent posterior lean noted with ambulation requiring assist to steady).  Pt's BP increased from 155/73 (at rest) to 192/87 with activity (nursing present end of session and notified).  Pt would benefit from skilled PT to address noted impairments and functional limitations.  D/t current assist levels, balance concerns, and h/o multiple falls, recommend pt discharge to STR when medically appropriate.     Follow Up Recommendations SNF    Equipment Recommendations  Rolling walker with 5" wheels    Recommendations for Other Services       Precautions / Restrictions Precautions Precautions: Fall Restrictions Weight Bearing Restrictions: No      Mobility  Bed Mobility Overal bed mobility: Needs Assistance Bed Mobility: Supine to  Sit     Supine to sit: Mod assist     General bed mobility comments: assist for trunk and B LE's; vc's for technique required; increased time required  Transfers Overall transfer level: Needs assistance Equipment used: Rolling walker (2 wheeled) Transfers: Sit to/from Stand Sit to Stand: Min assist;Mod assist;+2 safety/equipment         General transfer comment: pt with posterior lean initially upon standing requiring assist for balance  Ambulation/Gait Ambulation/Gait assistance: Min assist;+2 safety/equipment Ambulation Distance (Feet): 5 Feet (stretcher to hospital bed) Assistive device: Rolling walker (2 wheeled)   Gait velocity: decreased   General Gait Details: decreased B step length/foot clearance/heelstrike; posterior lean intermittently requiring assist for balance; 2nd assist for safety  Stairs            Wheelchair Mobility    Modified Rankin (Stroke Patients Only)       Balance Overall balance assessment: Needs assistance Sitting-balance support: Bilateral upper extremity supported;Feet supported Sitting balance-Leahy Scale: Fair     Standing balance support: Bilateral upper extremity supported (on RW) Standing balance-Leahy Scale: Poor Standing balance comment: posterior lean upon standing and intermittently with ambulation with RW                             Pertinent Vitals/Pain Pain Assessment: No/denies pain  See flow sheet for details.    Home Living Family/patient expects to be discharged to:: Private residence Living Arrangements: Spouse/significant other Available Help at Discharge: Family  Home Access: Level entry     Home Layout: One level Home Equipment: Cane - single point      Prior Function Level of Independence: Independent with assistive device(s)         Comments: Pt reports being independent and using SPC for ambulation; pt denies any recent falls (pt also appearing to be poor historian but no  family present to confirm PLOF and home set-up)     Hand Dominance        Extremity/Trunk Assessment   Upper Extremity Assessment: Generalized weakness           Lower Extremity Assessment: Generalized weakness         Communication   Communication: No difficulties  Cognition Arousal/Alertness:  (Pt initially sleeping upon PT arriving but then woke up and alert) Behavior During Therapy: [facility] for tasks assessed/performed Overall Cognitive Status: No family/caregiver present to determine baseline cognitive functioning (Oriented to person but not place, date, or situation)       Memory: Decreased recall of precautions              General Comments   Nursing cleared pt for participation in physical therapy.  Pt agreeable to PT session.    Exercises        Assessment/Plan    PT Assessment Patient needs continued PT services  PT Diagnosis Difficulty walking;Generalized weakness   PT Problem List Decreased strength;Decreased balance;Decreased mobility;Decreased cognition;Decreased safety awareness  PT Treatment Interventions DME instruction;Gait training;Stair training;Functional mobility training;Therapeutic activities;Therapeutic exercise;Balance training;Patient/family education   PT Goals (Current goals can be found in the Care Plan section) Acute Rehab PT Goals Patient Stated Goal: to get stronger PT Goal Formulation: With patient Time For Goal Achievement: 04/16/15 Potential to Achieve Goals: Good    Frequency Min 2X/week   Barriers to discharge Decreased caregiver support      Co-evaluation               End of Session Equipment Utilized During Treatment: Gait belt;Oxygen Activity Tolerance: Patient tolerated treatment well Patient left: in bed;with call bell/phone within reach;with bed alarm set;with nursing/sitter in room Nurse Communication: Mobility status;Precautions         Time: 0902-0920 PT Time Calculation (min) (ACUTE ONLY):  18 min   Charges:   PT Evaluation $PT Eval Low Complexity: 1 Procedure     PT G CodesHendricks Limes 2015/04/20, 9:42 AM Hendricks Limes, PT [phone]

## 2015-04-02 NOTE — ED Notes (Signed)
Pt noted to be climbing out of bed in soiled brief, pt cleaned and linen changed. Repositioned in bed for comfort. Bed alarm applied to alert staff.

## 2015-04-02 NOTE — ED Notes (Signed)
Eating his breakfast now.

## 2015-04-02 NOTE — [place] FL2 (Signed)
[place] MEDICAID FL2 LEVEL OF CARE SCREENING TOOL     IDENTIFICATION  Patient Name: Patrick Morrison Birthdate: 1933-10-24 Sex: male Admission Date (Current Location): 04/01/2015  [place] and [place] Number:  Chiropodist and Address:  [facility], [address] [place], [place] [address]      Provider Number: [id number]  Attending Physician Name and Address:  No att. providers found  Relative Name and Phone Number:       Current Level of Care: Hospital Recommended Level of Care: Skilled Nursing Facility Prior Approval Number:    Date Approved/Denied:   PASRR Number:  [id number])  Discharge Plan: SNF    Current Diagnoses: There are no active problems to display for this patient.   Orientation RESPIRATION BLADDER Height & Weight    Self, Time, Situation, Place  Normal Continent 5\' 6"  (167.6 cm) 137 lbs.  BEHAVIORAL SYMPTOMS/MOOD NEUROLOGICAL BOWEL NUTRITION STATUS   (None)  (None) Continent Diet (Heart )  AMBULATORY STATUS COMMUNICATION OF NEEDS Skin   Extensive Assist Verbally Normal                       Personal Care Assistance Level of Assistance  Bathing, Dressing, Feeding Bathing Assistance: Limited assistance Feeding assistance: Independent Dressing Assistance: Limited assistance     Functional Limitations Info  Sight, Hearing, Speech Sight Info: Adequate Hearing Info: Adequate Speech Info: Adequate    SPECIAL CARE FACTORS FREQUENCY  PT (By licensed PT)     PT Frequency:  (5)              Contractures      Additional Factors Info  Allergies, Isolation Precautions   Allergies Info:  (No Known Allergies )     Isolation Precautions Info:  (Enteric precautions )     Current Medications (04/02/2015):  This is the current hospital active medication list Current Facility-Administered Medications  Medication Dose Route Frequency Provider Last Rate Last Dose  . insulin aspart (novoLOG) injection 5  Units  5 Units Subcutaneous Once Arnaldo Natal, MD      . insulin glargine (LANTUS) injection 5 Units  5 Units Subcutaneous Daily Arnaldo Natal, MD   5 Units at 04/02/15 1102   Current Outpatient Prescriptions  Medication Sig Dispense Refill  . ADVAIR DISKUS 250-50 MCG/DOSE AEPB Take 1 puff by mouth 2 (two) times daily.  2  . fluticasone (FLONASE) 50 MCG/ACT nasal spray Place 2 sprays into both nostrils daily.   2  . hydrochlorothiazide (HYDRODIURIL) 25 MG tablet Take 1 tablet by mouth daily.  2  . insulin glargine (LANTUS) 100 UNIT/ML injection Inject 8 Units into the skin at bedtime.    . insulin lispro (HUMALOG) 100 UNIT/ML KiwkPen Inject 5-8 Units into the skin 2 (two) times daily.    Marland Kitchen lisinopril (PRINIVIL,ZESTRIL) 40 MG tablet Take 1 tablet by mouth daily.  2  . Melatonin 3 MG TABS Take 1 tablet by mouth at bedtime.    Marland Kitchen oxybutynin (DITROPAN-XL) 5 MG 24 hr tablet Take 1 tablet by mouth daily.  0  . simvastatin (ZOCOR) 20 MG tablet Take 1 tablet by mouth daily at 6 PM.   2  . terazosin (HYTRIN) 10 MG capsule Take 1 capsule by mouth daily.  2  . triamcinolone ointment (KENALOG) 0.1 % Apply 1 application topically 2 (two) times daily.   0     Discharge Medications: Please see discharge summary for a list of discharge medications.  Relevant Imaging Results:  Relevant Lab Results:   Additional Information  (SSN [id number])  Haig ProphetMorgan, Xion Debruyne G, LCSW

## 2015-05-05 ENCOUNTER — Emergency Department
Admission: EM | Admit: 2015-05-05 | Discharge: 2015-05-05 | Disposition: A | Discharge status: Home / Self Care | Payer: Medicare Other | Attending: Emergency Medicine | Admitting: Emergency Medicine

## 2015-05-05 DIAGNOSIS — E113219 Type 2 diabetes mellitus with mild nonproliferative diabetic retinopathy with macular edema, unspecified eye: Secondary | ICD-10-CM | POA: Diagnosis not present

## 2015-05-05 DIAGNOSIS — E1165 Type 2 diabetes mellitus with hyperglycemia: Secondary | ICD-10-CM | POA: Diagnosis present

## 2015-05-05 DIAGNOSIS — Z7951 Long term (current) use of inhaled steroids: Secondary | ICD-10-CM | POA: Diagnosis not present

## 2015-05-05 DIAGNOSIS — Z79899 Other long term (current) drug therapy: Secondary | ICD-10-CM | POA: Diagnosis not present

## 2015-05-05 DIAGNOSIS — Z7952 Long term (current) use of systemic steroids: Secondary | ICD-10-CM | POA: Insufficient documentation

## 2015-05-05 DIAGNOSIS — Z794 Long term (current) use of insulin: Secondary | ICD-10-CM | POA: Insufficient documentation

## 2015-05-05 DIAGNOSIS — I10 Essential (primary) hypertension: Secondary | ICD-10-CM | POA: Insufficient documentation

## 2015-05-05 DIAGNOSIS — R739 Hyperglycemia, unspecified: Secondary | ICD-10-CM

## 2015-05-05 DIAGNOSIS — Z87891 Personal history of nicotine dependence: Secondary | ICD-10-CM | POA: Diagnosis not present

## 2015-05-05 LAB — COMPREHENSIVE METABOLIC PANEL
ALBUMIN: 3.6 g/dL (ref 3.5–5.0)
ALK PHOS: 87 U/L (ref 38–126)
ALT: 17 U/L (ref 17–63)
ANION GAP: 7 (ref 5–15)
AST: 16 U/L (ref 15–41)
BILIRUBIN TOTAL: 0.6 mg/dL (ref 0.3–1.2)
BUN: 32 mg/dL — AB (ref 6–20)
CALCIUM: 8.9 mg/dL (ref 8.9–10.3)
CO2: 29 mmol/L (ref 22–32)
CREATININE: 1.4 mg/dL — AB (ref 0.61–1.24)
Chloride: 101 mmol/L (ref 101–111)
GFR calc Af Amer: 54 mL/min — ABNORMAL LOW (ref >60)
GFR calc non Af Amer: 46 mL/min — ABNORMAL LOW (ref >60)
GLUCOSE: 318 mg/dL — AB (ref 65–99)
Potassium: 3.8 mmol/L (ref 3.5–5.1)
Sodium: 137 mmol/L (ref 135–145)
TOTAL PROTEIN: 7 g/dL (ref 6.5–8.1)

## 2015-05-05 LAB — CBC
HEMATOCRIT: 35.8 % — AB (ref 40.0–52.0)
HEMOGLOBIN: 11.6 g/dL — AB (ref 13.0–18.0)
MCH: 30.8 pg (ref 26.0–34.0)
MCHC: 32.3 g/dL (ref 32.0–36.0)
MCV: 95.3 fL (ref 80.0–100.0)
Platelets: 213 10*3/uL (ref 150–440)
RBC: 3.76 MIL/uL — ABNORMAL LOW (ref 4.40–5.90)
RDW: 13.9 % (ref 11.5–14.5)
WBC: 6.4 10*3/uL (ref 3.8–10.6)

## 2015-05-05 LAB — GLUCOSE, CAPILLARY: GLUCOSE-CAPILLARY: 309 mg/dL — AB (ref 65–99)

## 2015-05-05 NOTE — Discharge Instructions (Signed)
Hyperglycemia °Hyperglycemia occurs when the glucose (sugar) in your blood is too high. Hyperglycemia can happen for many reasons, but it most often happens to people who do not know they have diabetes or are not managing their diabetes properly.  °CAUSES  °Whether you have diabetes or not, there are other causes of hyperglycemia. Hyperglycemia can occur when you have diabetes, but it can also occur in other situations that you might not be as aware of, such as: °Diabetes °· If you have diabetes and are having problems controlling your blood glucose, hyperglycemia could occur because of some of the following reasons: °¨ Not following your meal plan. °¨ Not taking your diabetes medications or not taking it properly. °¨ Exercising less or doing less activity than you normally do. °¨ Being sick. °Pre-diabetes °· This cannot be ignored. Before people develop Type 2 diabetes, they almost always have "pre-diabetes." This is when your blood glucose levels are higher than normal, but not yet high enough to be diagnosed as diabetes. Research has shown that some long-term damage to the body, especially the heart and circulatory system, may already be occurring during pre-diabetes. If you take action to manage your blood glucose when you have pre-diabetes, you may delay or prevent Type 2 diabetes from developing. °Stress °· If you have diabetes, you may be "diet" controlled or on oral medications or insulin to control your diabetes. However, you may find that your blood glucose is higher than usual in the hospital whether you have diabetes or not. This is often referred to as "stress hyperglycemia." Stress can elevate your blood glucose. This happens because of hormones put out by the body during times of stress. If stress has been the cause of your high blood glucose, it can be followed regularly by your caregiver. That way he/she can make sure your hyperglycemia does not continue to get worse or progress to  diabetes. °Steroids °· Steroids are medications that act on the infection fighting system (immune system) to block inflammation or infection. One side effect can be a rise in blood glucose. Most people can produce enough extra insulin to allow for this rise, but for those who cannot, steroids make blood glucose levels go even higher. It is not unusual for steroid treatments to "uncover" diabetes that is developing. It is not always possible to determine if the hyperglycemia will go away after the steroids are stopped. A special blood test called an A1c is sometimes done to determine if your blood glucose was elevated before the steroids were started. °SYMPTOMS °· Thirsty. °· Frequent urination. °· Dry mouth. °· Blurred vision. °· Tired or fatigue. °· Weakness. °· Sleepy. °· Tingling in feet or leg. °DIAGNOSIS  °Diagnosis is made by monitoring blood glucose in one or all of the following ways: °· A1c test. This is a chemical found in your blood. °· Fingerstick blood glucose monitoring. °· Laboratory results. °TREATMENT  °First, knowing the cause of the hyperglycemia is important before the hyperglycemia can be treated. Treatment may include, but is not be limited to: °· Education. °· Change or adjustment in medications. °· Change or adjustment in meal plan. °· Treatment for an illness, infection, etc. °· More frequent blood glucose monitoring. °· Change in exercise plan. °· Decreasing or stopping steroids. °· Lifestyle changes. °HOME CARE INSTRUCTIONS  °· Test your blood glucose as directed. °· Exercise regularly. Your caregiver will give you instructions about exercise. Pre-diabetes or diabetes which comes on with stress is helped by exercising. °· Eat wholesome,   balanced meals. Eat often and at regular, fixed times. Your caregiver or nutritionist will give you a meal plan to guide your sugar intake. °· Being at an ideal weight is important. If needed, losing as little as 10 to 15 pounds may help improve blood  glucose levels. °SEEK MEDICAL CARE IF:  °· You have questions about medicine, activity, or diet. °· You continue to have symptoms (problems such as increased thirst, urination, or weight gain). °SEEK IMMEDIATE MEDICAL CARE IF:  °· You are vomiting or have diarrhea. °· Your breath smells fruity. °· You are breathing faster or slower. °· You are very sleepy or incoherent. °· You have numbness, tingling, or pain in your feet or hands. °· You have chest pain. °· Your symptoms get worse even though you have been following your caregiver's orders. °· If you have any other questions or concerns. °  °This information is not intended to replace advice given to you by your health care provider. Make sure you discuss any questions you have with your health care provider. °  °Document Released: 08/31/2000 Document Revised: 05/30/2011 Document Reviewed: 11/11/2014 °Elsevier Interactive Patient Education ©2016 [employer] ° °

## 2015-05-05 NOTE — ED Notes (Signed)
PT has had increasing blood sugar for few days, today was 360 and pt and caretaker are confused about sliding scale.  Pt has no complaitns states feels normal.

## 2015-05-05 NOTE — ED Provider Notes (Signed)
[facility] Emergency Department Provider Note  Time seen: 10:23 PM  I have reviewed the triage vital signs and the nursing notes.   HISTORY  Chief Complaint Hyperglycemia    HPI Patrick Morrison is a 82 y.o. male with a past medical history of diabetes, hypertension, hyperlipidemia, who presents the emergency department with an elevated blood glucose. According to the patient and the patient's wife he has been recently switched to a flex pen, with daily dosing of Levemir in the morning. They have been logging BLOOD sugars and insulin doses given. She states over the past few days it is been around 300, today was 356 onset of dosing the insulin she brought him to the emergency department for evaluation. The patient has no complaints, denies any abdominal pain, nausea, vomiting. Patient and wife are extremely pleasant, admit that they were anxious about the 350 number which is why they did not dose the medicine instead brought him to the emergency department. They discussed this with the primary care doctor yesterday is a blood sugars have been elevated in the 300s, primary care doctor increase the daily Levemir from 15 units to 17 units. The flex pen dosing is based on a sliding scale regimen.     Past Medical History  Diagnosis Date  . DM (diabetes mellitus) (HCC)   . BPH (benign prostatic hyperplasia)   . Dysplasia of prostate   . Chronic rhinitis   . Osteoarthritis   . Edema   . COPD (chronic obstructive pulmonary disease) (HCC)   . Hypertension   . Hyperlipidemia   . Diabetic macular edema (HCC)   . NPDR (nonproliferative diabetic retinopathy) (HCC)   . Pseudophakia of both eyes   . Bilateral knee pain   . Primary osteoarthritis of both knees   . Bruit of right carotid artery   . Abnormal echocardiogram   . Personal history of other malignant neoplasm of skin   . Asymptomatic bilateral carotid artery stenosis     There are no active problems to  display for this patient.   Past Surgical History  Procedure Laterality Date  . Ankle surgery    . Colon surgery    . Colectomy      Current Outpatient Rx  Name  Route  Sig  Dispense  Refill  . ADVAIR DISKUS 250-50 MCG/DOSE AEPB   Oral   Take 1 puff by mouth 2 (two) times daily.      2     Dispense as written.   . fluticasone (FLONASE) 50 MCG/ACT nasal spray   Each Nare   Place 2 sprays into both nostrils daily.       2   . hydrochlorothiazide (HYDRODIURIL) 25 MG tablet   Oral   Take 1 tablet by mouth daily.      2   . insulin glargine (LANTUS) 100 UNIT/ML injection   Subcutaneous   Inject 8 Units into the skin at bedtime.         . insulin lispro (HUMALOG) 100 UNIT/ML KiwkPen   Subcutaneous   Inject 5-8 Units into the skin 2 (two) times daily.         Marland Kitchen lisinopril (PRINIVIL,ZESTRIL) 40 MG tablet   Oral   Take 1 tablet by mouth daily.      2   . Melatonin 3 MG TABS   Oral   Take 1 tablet by mouth at bedtime.         Marland Kitchen oxybutynin (DITROPAN-XL) 5 MG 24  hr tablet   Oral   Take 1 tablet by mouth daily.      0   . simvastatin (ZOCOR) 20 MG tablet   Oral   Take 1 tablet by mouth daily at 6 PM.       2   . terazosin (HYTRIN) 10 MG capsule   Oral   Take 1 capsule by mouth daily.      2   . triamcinolone ointment (KENALOG) 0.1 %   Topical   Apply 1 application topically 2 (two) times daily.       0     Allergies Review of patient's allergies indicates no known allergies.  No family history on file.  Social History Social History  Substance Use Topics  . Smoking status: Former Smoker    Quit date: 11/24/2003  . Smokeless tobacco: Never Used  . Alcohol Use: No    Review of Systems Constitutional: Negative for fever. Cardiovascular: Negative for chest pain. Respiratory: Negative for shortness of breath. Gastrointestinal: Negative for abdominal pain, vomiting and diarrhea. Genitourinary: Negative for dysuria. Neurological:  Negative for headache 10-point ROS otherwise negative.  ____________________________________________   PHYSICAL EXAM:  VITAL SIGNS: ED Triage Vitals  Enc Vitals Group     BP 05/05/15 1938 105/50 mmHg     Pulse Rate 05/05/15 1936 71     Resp 05/05/15 1936 18     Temp 05/05/15 1936 97.8 F (36.6 C)     Temp Source 05/05/15 1936 Oral     SpO2 05/05/15 1936 98 %     Weight 05/05/15 1936 130 lb (58.968 kg)     Height 05/05/15 1936 5\' 8"  (1.727 m)     Head Cir --      Peak Flow --      Pain Score 05/05/15 2124 0     Pain Loc --      Pain Edu? --      Excl. in GC? --     Constitutional: Alert and oriented. Well appearing and in no distress. Eyes: Normal exam ENT   Head: Normocephalic and atraumatic   Mouth/Throat: Mucous membranes are moist. Cardiovascular: Normal rate, regular rhythm.  Respiratory: Normal respiratory effort without tachypnea nor retractions. Breath sounds are clear  Gastrointestinal: Soft and nontender. No distention.   Musculoskeletal: Nontender with normal range of motion in all extremities Neurologic:  Normal speech and language. No gross focal neurologic deficits Skin:  Skin is warm, dry and intact.  Psychiatric: Mood and affect are normal. Speech and behavior are normal.  ____________________________________________    INITIAL IMPRESSION / ASSESSMENT AND PLAN / ED COURSE  Pertinent labs & imaging results that were available during my care of the patient were reviewed by me and considered in my medical decision making (see chart for details).  Patient presents to the emergency department for elevated blood glucose. She states she was just nervous about the elevated number, and wanted to get evaluated and wanted to talk to someone about his insulin dosing. She states she attempted to Prime the flex pen but did not see any insulin come out. I showed her how to Prime the flex pen in the emergency department against a tissue so that you can see the  insulin. I went over the patient's blood glucose readings, and sliding scale regimen with them. The patient's wife states she feels much more comfortable dosing the insulin now. As the patient was just increased from 15 units of Levemir daily to 17 units of Levemir  daily I do not believe we should make any further adjustments at this time as today was the first day the patient received 17 units. I discussed with the patient that he should give this new regimen 5-7 days to allow his body to adjust, if the blood glucose remains consistently elevated greater than 250-300, then I encouraged them to talk to his primary care physician about increasing the Levemir once again. Patient has no complaints, appears very well, normal physical exam. I will discharge the patient home with primary care follow-up regarding his insulin regimen. They are agreeable.  ____________________________________________   FINAL CLINICAL IMPRESSION(S) / ED DIAGNOSES  Hyperglycemia   Minna Antis, MD 05/05/15 2228

## 2015-05-05 NOTE — ED Notes (Signed)
Pt c/o hyperglycemia, wife states she didn't think the insulin worked at home due to mechanical issues of syringe. Pt and wife are confused about how to use insulin at home. Pt denies any symptoms. A&O

## 2015-05-14 ENCOUNTER — Encounter: Payer: Self-pay | Admitting: Emergency Medicine

## 2015-05-14 DIAGNOSIS — E113219 Type 2 diabetes mellitus with mild nonproliferative diabetic retinopathy with macular edema, unspecified eye: Secondary | ICD-10-CM | POA: Diagnosis not present

## 2015-05-14 DIAGNOSIS — Z794 Long term (current) use of insulin: Secondary | ICD-10-CM | POA: Insufficient documentation

## 2015-05-14 DIAGNOSIS — E11649 Type 2 diabetes mellitus with hypoglycemia without coma: Secondary | ICD-10-CM | POA: Diagnosis present

## 2015-05-14 DIAGNOSIS — Z87891 Personal history of nicotine dependence: Secondary | ICD-10-CM | POA: Diagnosis not present

## 2015-05-14 DIAGNOSIS — Z79899 Other long term (current) drug therapy: Secondary | ICD-10-CM | POA: Diagnosis not present

## 2015-05-14 DIAGNOSIS — Z7952 Long term (current) use of systemic steroids: Secondary | ICD-10-CM | POA: Insufficient documentation

## 2015-05-14 DIAGNOSIS — I10 Essential (primary) hypertension: Secondary | ICD-10-CM | POA: Diagnosis not present

## 2015-05-14 DIAGNOSIS — Z7951 Long term (current) use of inhaled steroids: Secondary | ICD-10-CM | POA: Insufficient documentation

## 2015-05-14 LAB — GLUCOSE, CAPILLARY: Glucose-Capillary: 233 mg/dL — ABNORMAL HIGH (ref 65–99)

## 2015-05-14 NOTE — ED Notes (Signed)
Patient has type 2 dm. Patient has been on insulin for 20 years and the md recently changed his insulin. Yesterday patient's fsbs was 433 and patient ws given 17 units of novolog and his fsbs dropped to 67. Tonight patient's fsbs was 295 and patient is unsure of how much insulin to take. Patient and wife were concerned that the insulin may drop it to low.

## 2015-05-15 ENCOUNTER — Emergency Department
Admission: EM | Admit: 2015-05-15 | Discharge: 2015-05-15 | Disposition: A | Discharge status: Home / Self Care | Payer: Medicare Other | Attending: Emergency Medicine | Admitting: Emergency Medicine

## 2015-05-15 DIAGNOSIS — E162 Hypoglycemia, unspecified: Secondary | ICD-10-CM

## 2015-05-15 NOTE — ED Provider Notes (Signed)
[facility] Emergency Department Provider Note  ____________________________________________  Time seen: Approximately 125 AM  I have reviewed the triage vital signs and the nursing notes.   HISTORY  Chief Complaint Blood Sugar Problem    HPI Patrick Morrison is a 82 y.o. male with a history of diabetes who is presenting today after having an episode of hypoglycemia 1 go. He says that his blood sugar dropped to 67 yesterday afternoon. He denies skipping any meals. Because of his low blood sugar he skipped his evening dose of insulin. He says that he was recently adjusted to a higher dose of insulin and took the first higher dose of insulin this morning. After taking a higher dose of insulin this morning his sugar has been in the high 200s throughout the entire day. He has not taken his evening dose of insulin because he is afraid of dropping his blood sugar once again. He said that yesterday he had the drink a glass of orange juice as well as eat a banana noted raise his blood sugar back. He is seen by primary care doctor at [facility] who was adjusting his insulins. Prior to these adjustments his sugars were routinely in the high 200s and 300s and sometimes 400s. He was seen in this emergency department about 10 days ago for hyperglycemia. He denies any recent illness. Says that he drinks plenty of water. Says that he usually carries around sugar pills when he needs them if his sugar gets low. He said that yesterday during a hyperglycemic episode he felt confused and got chills. He says that these symptoms did improve with the glucose. Family is concerned about where to proceed as far as insulin dosing from here. They're concerned that he may drop her sugar second time.   Past Medical History  Diagnosis Date  . DM (diabetes mellitus) (HCC)   . BPH (benign prostatic hyperplasia)   . Dysplasia of prostate   . Chronic rhinitis   . Osteoarthritis   . Edema   . COPD (chronic  obstructive pulmonary disease) (HCC)   . Hypertension   . Hyperlipidemia   . Diabetic macular edema (HCC)   . NPDR (nonproliferative diabetic retinopathy) (HCC)   . Pseudophakia of both eyes   . Bilateral knee pain   . Primary osteoarthritis of both knees   . Bruit of right carotid artery   . Abnormal echocardiogram   . Personal history of other malignant neoplasm of skin   . Asymptomatic bilateral carotid artery stenosis     There are no active problems to display for this patient.   Past Surgical History  Procedure Laterality Date  . Ankle surgery    . Colon surgery    . Colectomy      Current Outpatient Rx  Name  Route  Sig  Dispense  Refill  . ADVAIR DISKUS 250-50 MCG/DOSE AEPB   Oral   Take 1 puff by mouth 2 (two) times daily.      2     Dispense as written.   . fluticasone (FLONASE) 50 MCG/ACT nasal spray   Each Nare   Place 2 sprays into both nostrils daily.       2   . hydrochlorothiazide (HYDRODIURIL) 25 MG tablet   Oral   Take 1 tablet by mouth daily.      2   . insulin glargine (LANTUS) 100 UNIT/ML injection   Subcutaneous   Inject 8 Units into the skin at bedtime.         Marland Kitchen  insulin lispro (HUMALOG) 100 UNIT/ML KiwkPen   Subcutaneous   Inject 5-8 Units into the skin 2 (two) times daily.         Marland Kitchen lisinopril (PRINIVIL,ZESTRIL) 40 MG tablet   Oral   Take 1 tablet by mouth daily.      2   . Melatonin 3 MG TABS   Oral   Take 1 tablet by mouth at bedtime.         Marland Kitchen oxybutynin (DITROPAN-XL) 5 MG 24 hr tablet   Oral   Take 1 tablet by mouth daily.      0   . simvastatin (ZOCOR) 20 MG tablet   Oral   Take 1 tablet by mouth daily at 6 PM.       2   . terazosin (HYTRIN) 10 MG capsule   Oral   Take 1 capsule by mouth daily.      2   . triamcinolone ointment (KENALOG) 0.1 %   Topical   Apply 1 application topically 2 (two) times daily.       0     Allergies Review of patient's allergies indicates no known allergies.  No  family history on file.  Social History Social History  Substance Use Topics  . Smoking status: Former Smoker    Quit date: 11/24/2003  . Smokeless tobacco: Never Used  . Alcohol Use: No    Review of Systems Constitutional: No fever/chills Eyes: No visual changes. ENT: No sore throat. Cardiovascular: Denies chest pain. Respiratory: Denies shortness of breath. Gastrointestinal: No abdominal pain.  No nausea, no vomiting.  No diarrhea.  No constipation. Genitourinary: Negative for dysuria. Musculoskeletal: Negative for back pain. Skin: Negative for rash. Neurological: Negative for headaches, focal weakness or numbness.  10-point ROS otherwise negative.  ____________________________________________   PHYSICAL EXAM:  VITAL SIGNS: ED Triage Vitals  Enc Vitals Group     BP 05/14/15 2305 166/75 mmHg     Pulse Rate 05/14/15 2305 67     Resp 05/14/15 2305 18     Temp 05/14/15 2305 98.1 F (36.7 C)     Temp Source 05/14/15 2305 Oral     SpO2 05/14/15 2305 94 %     Weight 05/14/15 2305 130 lb (58.968 kg)     Height 05/14/15 2305 5\' 6"  (1.676 m)     Head Cir --      Peak Flow --      Pain Score 05/15/15 0052 0     Pain Loc --      Pain Edu? --      Excl. in GC? --     Constitutional: Alert and oriented. Well appearing and in no acute distress. Eyes: Conjunctivae are normal. PERRL. EOMI. Head: Atraumatic. Nose: No congestion/rhinnorhea. Mouth/Throat: Mucous membranes are moist.   Neck: No stridor.   Cardiovascular: Normal rate.   Respiratory: Normal respiratory effort.  No retractions. Lungs CTAB. Gastrointestinal: No distention.  Musculoskeletal: No lower extremity tenderness nor edema.  No joint effusions. Neurologic:  Normal speech and language. No gross focal neurologic deficits are appreciated.  Skin:  Skin is warm, dry and intact. No rash noted. Psychiatric: Mood and affect are normal. Speech and behavior are  normal.  ____________________________________________   LABS (all labs ordered are listed, but only abnormal results are displayed)  Labs Reviewed  GLUCOSE, CAPILLARY - Abnormal; Notable for the following:    Glucose-Capillary 233 (*)    All other components within normal limits   ____________________________________________  EKG  ____________________________________________  RADIOLOGY   ____________________________________________   PROCEDURES    ____________________________________________   INITIAL IMPRESSION / ASSESSMENT AND PLAN / ED COURSE  Pertinent labs & imaging results that were available during my care of the patient were reviewed by me and considered in my medical decision making (see chart for details).  I reviewed the patient's glucose logs and throughout the day today his sugar has been in the 200s including here in this emergency department. We just checked his renal function on February 14 which shows some mild renal failure. I offered to recheck the kidney function at this time but the family as well as the patient did not wish to do this. I said for them to skip the insulin tonight and to restart in the morning with the new dose. I also recommended checking sugars every 1-2 hours to make sure that he is not dropping his glucose levels. I then recommended that the family call the primary care doctor [facility] for further recommendations. I did discuss with the patient as well as the family that they may want to find to the insulin. The new adjustment included an a.m. as well as p.m. adjustment. I discussed possibly they wanted to go just with a once a day elevation to make sure that he wasn't having any hypoglycemic episodes. The family understands this plan and is willing to comply. Will be discharged to home. ____________________________________________   FINAL CLINICAL IMPRESSION(S) / ED DIAGNOSES  Hypoglycemia. Resolved.    Myrna Blazer,  MD 05/15/15 0200

## 2015-05-15 NOTE — ED Notes (Addendum)
Pt reports he is having "trouble with my diabetes" - Pt spouse stated that she is unclear on how many units of insulin to give the pt - MD on Feb 20th changed insulin and the home health nurse demonstrated the "flex pen" - Pt and spouse came to the ER because they are unclear about the amount of insulin that needs to be given - Last FSBS was 295 at 815pm and they have not given pt any insulin (Novolog) - Pt denies being sick

## 2015-05-18 ENCOUNTER — Encounter: Payer: Medicare Other | Attending: Internal Medicine | Admitting: *Deleted

## 2015-05-18 ENCOUNTER — Encounter: Payer: Self-pay | Admitting: *Deleted

## 2015-05-18 VITALS — BP 134/66 | Ht 66.0 in | Wt 131.8 lb

## 2015-05-18 DIAGNOSIS — E119 Type 2 diabetes mellitus without complications: Secondary | ICD-10-CM | POA: Insufficient documentation

## 2015-05-18 DIAGNOSIS — Z794 Long term (current) use of insulin: Secondary | ICD-10-CM

## 2015-05-18 NOTE — Patient Instructions (Addendum)
Check blood sugars 2 x day before breakfast and before supper every day Exercise: Continue exercises per Physical Therapy Eat 3 meals day,  1-2 snacks a day Space meals 4-6 hours apart Avoid sugar sweetened drinks (juices) Bring blood sugar records to the next class Carry fast acting glucose and a snack at all times

## 2015-05-19 NOTE — Progress Notes (Signed)
Diabetes Self-Management Education  Visit Type: First/Initial  Appt. Start Time: 1325   Appt. End Time: 1520  05/19/2015  [name], identified by name and date of birth, is a 82 y.o. male with a diagnosis of Diabetes: Type 2.   ASSESSMENT  Blood pressure 134/66, height  (1.676 m), weight 131 lb 12.8 oz (59.784 kg). Body mass index is 21.28 kg/(m^2).      Diabetes Self-Management Education - 05/18/15 1556    Visit Information   Visit Type First/Initial   Initial Visit   Diabetes Type Type 2   Are you currently following a meal plan? No   Are you taking your medications as prescribed? Yes   Date Diagnosed 1977   Health Coping   How would you rate your overall health? Fair   Psychosocial Assessment   Patient Belief/Attitude about Diabetes Motivated to manage diabetes   Self-care barriers Other (comment)  Memory loss   Self-management support Doctor's office;Family   Other persons present Spouse/SO;Family Member  son   Patient Concerns Nutrition/Meal planning;Medication;Monitoring;Glycemic Control;Problem Solving   Special Needs Instruct caregiver;Simplified materials; pt's wife asks multiple questions and writes down responses in a notebook.    Preferred Learning Style Visual;Hands on   Learning Readiness Change in progress   How often do you need to have someone help you when you read instructions, pamphlets, or other written materials from your doctor or pharmacy? 1 - Never   What is the last grade level you completed in school? BS   Complications   Last HgB A1C per patient/outside source 6.8 %  02/18/15   How often do you check your blood sugar? 1-2 times/day   Fasting Blood glucose range (mg/dL) 956-213;086-578;>469  FBG's 167-333 mg/dL   Postprandial Blood glucose range (mg/dL) --  pre-supper readings 153-295 mg/dL   Number of hypoglycemic episodes per month 1  resulted in ER visit   Can you tell when your blood sugar is low? No   Number of  hyperglycemic episodes per week 1  resulted in ER visit   Can you tell when your blood sugar is high? No   Have you had a dilated eye exam in the past 12 months? Yes   Have you had a dental exam in the past 12 months? Yes   Are you checking your feet? Yes   How many days per week are you checking your feet? 7   Dietary Intake   Breakfast 2 eggs, 2 pieces of canadian bacon, 1 piece of toast   Snack (morning) fruit   Lunch skips   Dinner meat and vegetables; salads; green beans, pintos   Snack (evening) peanut butter sandwich   Beverage(s) water, diet soda, juice   Exercise   Exercise Type ADL's  Pt has home health PT   Patient Education   Previous Diabetes Education Yes (please comment)  "no formal training - just from MD"   Disease state  Definition of diabetes, type 1 and 2, and the diagnosis of diabetes   Nutrition management  Role of diet in the treatment of diabetes and the relationship between the three main macronutrients and blood glucose level;Meal timing in regards to the patients' current diabetes medication.   Physical activity and exercise  Role of exercise on diabetes management, blood pressure control and cardiac health.   Medications Taught/reviewed insulin injection, site rotation, insulin storage and needle disposal.;Reviewed patients medication for diabetes, action, purpose, timing of dose and side effects. Both husband and wife "team  up" to administer insulin.   Monitoring Purpose and frequency of SMBG.;Identified appropriate SMBG and/or A1C goals.   Acute complications Taught treatment of hypoglycemia - the 15 rule.   Chronic complications Relationship between chronic complications and blood glucose control   Psychosocial adjustment Identified and addressed patients feelings and concerns about diabetes Wife appears overwhelmed with pt's care since he was managing his diabetes prior to hospitalization and stay in rehab. She took multiple notes and brought a notebook  that she records what happens to patient during the day (blood sugar, how much insulin).    Individualized Goals (developed by patient)   Reducing Risk Improve blood sugars Decrease medications Prevent diabetes complications Lose weight Lead a healthier lifestyle Become more fit   Outcomes   Expected Outcomes Demonstrated interest in learning. Expect positive outcomes      Individualized Plan for Diabetes Self-Management Training:   Learning Objective:  Patient will have a greater understanding of diabetes self-management. Patient education plan is to attend individual and/or group sessions per assessed needs and concerns.   Plan:   Patient Instructions  Check blood sugars 2 x day before breakfast and before supper every day Exercise: Continue exercises per Physical Therapy Eat 3 meals day,  1-2 snacks a day Space meals 4-6 hours apart Avoid sugar sweetened drinks (juices) Bring blood sugar records to the next class Carry fast acting glucose and a snack at all times  Expected Outcomes:  Demonstrated interest in learning. Expect positive outcomes  Education material provided:  General Meal Planning Guidelines Simple Meal Plan Symptoms, causes and treatments of Hypoglycemia  If problems or questions, patient to contact team via:   Sharion Settler, RN, CCM, CDE [phone]  Future DSME appointment:  Pt's wife to check with their doctor appointments and home health visits before scheduling diabetes classes. She was given a list of class dates and times for the next 3 months and encouraged to call back.

## 2015-05-22 ENCOUNTER — Telehealth: Payer: Self-pay | Admitting: *Deleted

## 2015-05-22 NOTE — Telephone Encounter (Signed)
Received call from pt's wife. She had questions about beer and creamy soups. Discussed basic meal planning - calories. She also reported that they will try evening class series beginning March 13. She doesn't know if this will work due to his insulin and meal schedule. They were offered 1:1 visits with dietitian and nurse as well.

## 2015-05-29 ENCOUNTER — Encounter: Payer: Self-pay | Admitting: *Deleted

## 2015-05-29 NOTE — Progress Notes (Signed)
Pt's wife Patrick Morrison came toCorrie Morrison the office and spoke with office manager Patrick Morrison. They are not able to come to classes beginning 06/01/15. She agreed to come with patient for 1:1 appointments with the nurse and dietitian. Pt and wife require repetition with instructions and education. She is very overwhelmed with his care. An appointment with the dietitian was scheduled for 06/15/15.

## 2015-06-15 ENCOUNTER — Encounter: Payer: Medicare Other | Attending: Internal Medicine | Admitting: Dietician

## 2015-06-15 ENCOUNTER — Encounter: Payer: Self-pay | Admitting: Dietician

## 2015-06-15 VITALS — Ht 66.0 in | Wt 131.6 lb

## 2015-06-15 DIAGNOSIS — E119 Type 2 diabetes mellitus without complications: Secondary | ICD-10-CM | POA: Insufficient documentation

## 2015-06-15 DIAGNOSIS — Z794 Long term (current) use of insulin: Secondary | ICD-10-CM

## 2015-06-15 NOTE — Patient Instructions (Signed)
   If  Your blood sugar is below 70mg /dl:  Take 4oz juice, regular soda, or other drink with real sugar.              Wait 10-15 minutes, then retest blood sugar.  If still below 70, repeat the drink.              Then eat a balanced meal or snack with protein and carbohydrate within 30 minutes.

## 2015-06-15 NOTE — Progress Notes (Signed)
Diabetes Self-Management Education  Visit Type:  Comprehensive  Appt. Start Time: 1315 Appt. End Time: 1500  06/15/2015  Mr. Erick Colace, identified by name and date of birth, is a 82 y.o. male with a diagnosis of Diabetes:  .   ASSESSMENT  Height  (1.676 m), weight 131 lb 9.6 oz (59.693 kg). Body mass index is 21.25 kg/(m^2).       Diabetes Self-Management Education - 06/15/15 1316    Complications   How often do you check your blood sugar? 1-2 times/day   Fasting Blood glucose range (mg/dL) 161-096;>045;40-981;191-478  most readings >200   Postprandial Blood glucose range (mg/dL) <29;56-213;086-578;469-629;>528  BEFORE supper   Number of hypoglycemic episodes per month 4   Can you tell when your blood sugar is low? Yes   What do you do if your blood sugar is low? drank orange juice. Also ate banana when BG was /dl.   Have you had a dilated eye exam in the past 12 months? Yes   Have you had a dental exam in the past 12 months? Yes   Are you checking your feet? Yes   How many days per week are you checking your feet? 7   Dietary Intake   Breakfast 2 meals and 2 snacks daily   Exercise   Exercise Type Light (walking / raking leaves)  physical therapy   How many days per week to you exercise? 2   Patient Education   Disease state  Definition of diabetes, type 1 and 2, and the diagnosis of diabetes;Factors that contribute to the development of diabetes;Other (comment)  blood sugar goals   Nutrition management  Role of diet in the treatment of diabetes and the relationship between the three main macronutrients and blood glucose level;Meal timing in regards to the patients' current diabetes medication.;Effects of alcohol on blood glucose and safety factors with consumption of alcohol.;Meal options for control of blood glucose level and chronic complications.;Other (comment)  basic meal planning instruction, including general guidelines,  plate method, carbohydrate needs  and healthy carb choices, meal and snack options   Monitoring Taught/discussed recording of test results and interpretation of SMBG.   Acute complications Taught treatment of hypoglycemia - the 15 rule.      Learning Objective:  Patient will have a greater understanding of diabetes self-management. Patient education plan is to attend individual and/or group sessions per assessed needs and concerns.   Plan:   Patient Instructions   If  Your blood sugar is below /dl:  Take 4oz juice, regular soda, or other drink with real sugar.              Wait 10-15 minutes, then retest blood sugar.  If still below 70, repeat the drink.              Then eat a balanced meal or snack with protein and carbohydrate within 30 minutes.     Expected Outcomes:     Education material provided: Binder with Class 1 materials.         Planning A Balanced Meal         Quick and Healthy Meal Ideas         Smart Snacking  If problems or questions, patient to contact team via:  Phone  Future DSME appointment: -  Wife would like to come to group classes for interaction with others, but patient is unable to come.       Discussed possibility of coming to a  portion of class with patient; will contact patient and wife later in the week to determine best option and schedule accordingly.

## 2015-06-19 ENCOUNTER — Telehealth: Payer: Self-pay | Admitting: *Deleted

## 2015-06-19 NOTE — Telephone Encounter (Signed)
Received call from patient's wife Patrick Morrison. She spoke with dietitian at last appointment about coming to classes instead of 1:1 appointments. Explained that [name] would have to come with her and he would have a difficult time sitting for long periods. They also require repetition and may not be able to follow along in class. She agreed but didn't want to schedule a follow up appointment today. She will work on meal planning and call back. Explained that they can come to several more 1:1 appointments with me and the dietitian and referral is good for a year.

## 2015-06-29 ENCOUNTER — Telehealth: Payer: Self-pay | Admitting: *Deleted

## 2015-06-29 NOTE — Telephone Encounter (Signed)
Have received 2 calls from patient's wife Corrie DandyMary regarding food choices for patient (1 regarding mac-n-cheese and 1 regarding stuffed cabbage). Explained carbohydrate servings and to allow patient small amount. She still doesn't want to schedule any further appointments at this time.

## 2015-07-06 ENCOUNTER — Telehealth: Payer: Self-pay | Admitting: *Deleted

## 2015-07-06 NOTE — Telephone Encounter (Signed)
Received voice mail from last Friday 4/14 when I was not in the office. Pt's wife Patrick Morrison asked for me to call her today because patient was having low blood sugars. [facility]. Pt is taking Levemir 20 units in the morning and NovoLog 5 units plus sliding scale. She recorded 5 low blood sugars (54, 59, 51, 66, 41) in the last few weeks - all occurred before supper. Prior to these lows the patient had 7, 9 and 12 units of NovoLog with breakfast. He eats breakfast from 10-12 then has supper at 5-6:00 pm. He will not eat a snack between. His fasting blood sugars are high 194-247 mg/dL. Instructed her to call MD office regarding frequent lows so medication adjustments can be made. Reviewed proper treatments for hypoglycemia. Also instructed her to give him fast acting carbohydrate for lows then let him eat afterwards. She requested another visit with the dietitian, then stated she would call back when she looked at her schedule.

## 2015-07-10 ENCOUNTER — Telehealth: Payer: Self-pay | Admitting: *Deleted

## 2015-07-10 NOTE — Telephone Encounter (Signed)
Received call from pt's wife Corrie DandyMary. She is requesting another appointment with this nurse. Scheduled pt and wife to return on Tues May 2 at 3:15 pm. Asked her about low blood sugars and she reports that these have improved. She is giving him more food at breakfast. She didn't call MD to let him know about lows as suggested on 07/06/15. Pt's wife also reports they are changing doctors and he will be seeing [facility].

## 2015-07-21 ENCOUNTER — Ambulatory Visit: Payer: Medicare Other | Admitting: *Deleted

## 2015-08-10 ENCOUNTER — Other Ambulatory Visit: Payer: Self-pay | Admitting: Internal Medicine

## 2015-08-10 DIAGNOSIS — Y92009 Unspecified place in unspecified non-institutional (private) residence as the place of occurrence of the external cause: Principal | ICD-10-CM

## 2015-08-10 DIAGNOSIS — W19XXXA Unspecified fall, initial encounter: Secondary | ICD-10-CM

## 2015-08-14 ENCOUNTER — Ambulatory Visit
Admission: RE | Admit: 2015-08-14 | Discharge: 2015-08-14 | Disposition: A | Discharge status: Home / Self Care | Payer: Medicare Other | Source: Ambulatory Visit | Attending: Internal Medicine | Admitting: Internal Medicine

## 2015-08-14 DIAGNOSIS — G319 Degenerative disease of nervous system, unspecified: Secondary | ICD-10-CM | POA: Insufficient documentation

## 2015-08-14 DIAGNOSIS — A5217 General paresis: Secondary | ICD-10-CM | POA: Insufficient documentation

## 2015-08-14 DIAGNOSIS — S0990XA Unspecified injury of head, initial encounter: Secondary | ICD-10-CM | POA: Diagnosis not present

## 2015-08-14 DIAGNOSIS — Y92009 Unspecified place in unspecified non-institutional (private) residence as the place of occurrence of the external cause: Secondary | ICD-10-CM

## 2015-08-14 DIAGNOSIS — W1830XA Fall on same level, unspecified, initial encounter: Secondary | ICD-10-CM | POA: Insufficient documentation

## 2015-08-14 DIAGNOSIS — W19XXXA Unspecified fall, initial encounter: Secondary | ICD-10-CM

## 2015-09-08 ENCOUNTER — Encounter: Payer: Self-pay | Admitting: *Deleted

## 2016-02-09 ENCOUNTER — Emergency Department: Payer: Medicare Other

## 2016-02-09 ENCOUNTER — Inpatient Hospital Stay
Admission: EM | Admit: 2016-02-09 | Discharge: 2016-02-13 | DRG: 470 | Disposition: A | Discharge status: Skilled Nursing Facility | Payer: Medicare Other | Attending: Internal Medicine | Admitting: Internal Medicine

## 2016-02-09 ENCOUNTER — Encounter: Payer: Self-pay | Admitting: Emergency Medicine

## 2016-02-09 ENCOUNTER — Inpatient Hospital Stay: Payer: Medicare Other

## 2016-02-09 DIAGNOSIS — E113219 Type 2 diabetes mellitus with mild nonproliferative diabetic retinopathy with macular edema, unspecified eye: Secondary | ICD-10-CM | POA: Diagnosis present

## 2016-02-09 DIAGNOSIS — K920 Hematemesis: Secondary | ICD-10-CM

## 2016-02-09 DIAGNOSIS — Z9181 History of falling: Secondary | ICD-10-CM

## 2016-02-09 DIAGNOSIS — R0602 Shortness of breath: Secondary | ICD-10-CM

## 2016-02-09 DIAGNOSIS — D62 Acute posthemorrhagic anemia: Secondary | ICD-10-CM | POA: Diagnosis present

## 2016-02-09 DIAGNOSIS — N400 Enlarged prostate without lower urinary tract symptoms: Secondary | ICD-10-CM | POA: Diagnosis present

## 2016-02-09 DIAGNOSIS — Z87891 Personal history of nicotine dependence: Secondary | ICD-10-CM

## 2016-02-09 DIAGNOSIS — Z794 Long term (current) use of insulin: Secondary | ICD-10-CM | POA: Diagnosis not present

## 2016-02-09 DIAGNOSIS — Z85828 Personal history of other malignant neoplasm of skin: Secondary | ICD-10-CM

## 2016-02-09 DIAGNOSIS — I6523 Occlusion and stenosis of bilateral carotid arteries: Secondary | ICD-10-CM | POA: Diagnosis present

## 2016-02-09 DIAGNOSIS — Z96649 Presence of unspecified artificial hip joint: Secondary | ICD-10-CM

## 2016-02-09 DIAGNOSIS — N184 Chronic kidney disease, stage 4 (severe): Secondary | ICD-10-CM | POA: Diagnosis present

## 2016-02-09 DIAGNOSIS — I129 Hypertensive chronic kidney disease with stage 1 through stage 4 chronic kidney disease, or unspecified chronic kidney disease: Secondary | ICD-10-CM | POA: Diagnosis present

## 2016-02-09 DIAGNOSIS — S72012A Unspecified intracapsular fracture of left femur, initial encounter for closed fracture: Secondary | ICD-10-CM | POA: Diagnosis present

## 2016-02-09 DIAGNOSIS — E1122 Type 2 diabetes mellitus with diabetic chronic kidney disease: Secondary | ICD-10-CM | POA: Diagnosis present

## 2016-02-09 DIAGNOSIS — S72002S Fracture of unspecified part of neck of left femur, sequela: Secondary | ICD-10-CM | POA: Diagnosis present

## 2016-02-09 DIAGNOSIS — E11649 Type 2 diabetes mellitus with hypoglycemia without coma: Secondary | ICD-10-CM | POA: Diagnosis not present

## 2016-02-09 DIAGNOSIS — M25552 Pain in left hip: Secondary | ICD-10-CM | POA: Diagnosis present

## 2016-02-09 DIAGNOSIS — E785 Hyperlipidemia, unspecified: Secondary | ICD-10-CM | POA: Diagnosis present

## 2016-02-09 DIAGNOSIS — Z87430 Personal history of prostatic dysplasia: Secondary | ICD-10-CM | POA: Diagnosis not present

## 2016-02-09 DIAGNOSIS — Z79899 Other long term (current) drug therapy: Secondary | ICD-10-CM

## 2016-02-09 DIAGNOSIS — Y92002 Bathroom of unspecified non-institutional (private) residence single-family (private) house as the place of occurrence of the external cause: Secondary | ICD-10-CM | POA: Diagnosis not present

## 2016-02-09 DIAGNOSIS — R262 Difficulty in walking, not elsewhere classified: Secondary | ICD-10-CM

## 2016-02-09 DIAGNOSIS — W010XXA Fall on same level from slipping, tripping and stumbling without subsequent striking against object, initial encounter: Secondary | ICD-10-CM | POA: Diagnosis present

## 2016-02-09 DIAGNOSIS — F0390 Unspecified dementia without behavioral disturbance: Secondary | ICD-10-CM | POA: Diagnosis present

## 2016-02-09 DIAGNOSIS — J449 Chronic obstructive pulmonary disease, unspecified: Secondary | ICD-10-CM | POA: Diagnosis present

## 2016-02-09 DIAGNOSIS — N19 Unspecified kidney failure: Secondary | ICD-10-CM

## 2016-02-09 DIAGNOSIS — S72002A Fracture of unspecified part of neck of left femur, initial encounter for closed fracture: Secondary | ICD-10-CM

## 2016-02-09 DIAGNOSIS — M6281 Muscle weakness (generalized): Secondary | ICD-10-CM

## 2016-02-09 DIAGNOSIS — M170 Bilateral primary osteoarthritis of knee: Secondary | ICD-10-CM | POA: Diagnosis present

## 2016-02-09 LAB — CBC WITH DIFFERENTIAL/PLATELET
Basophils Absolute: 0.1 10*3/uL (ref 0–0.1)
Basophils Relative: 1 %
EOS PCT: 0 %
Eosinophils Absolute: 0 10*3/uL (ref 0–0.7)
HEMATOCRIT: 32.9 % — AB (ref 40.0–52.0)
Hemoglobin: 11 g/dL — ABNORMAL LOW (ref 13.0–18.0)
LYMPHS ABS: 0.6 10*3/uL — AB (ref 1.0–3.6)
LYMPHS PCT: 5 %
MCH: 32.7 pg (ref 26.0–34.0)
MCHC: 33.5 g/dL (ref 32.0–36.0)
MCV: 97.7 fL (ref 80.0–100.0)
MONO ABS: 0.7 10*3/uL (ref 0.2–1.0)
Monocytes Relative: 6 %
Neutro Abs: 9.4 10*3/uL — ABNORMAL HIGH (ref 1.4–6.5)
Neutrophils Relative %: 88 %
PLATELETS: 182 10*3/uL (ref 150–440)
RBC: 3.37 MIL/uL — AB (ref 4.40–5.90)
RDW: 12.4 % (ref 11.5–14.5)
WBC: 10.7 10*3/uL — AB (ref 3.8–10.6)

## 2016-02-09 LAB — COMPREHENSIVE METABOLIC PANEL
ALK PHOS: 70 U/L (ref 38–126)
ALT: 14 U/L — AB (ref 17–63)
AST: 22 U/L (ref 15–41)
Albumin: 3.2 g/dL — ABNORMAL LOW (ref 3.5–5.0)
Anion gap: 9 (ref 5–15)
BILIRUBIN TOTAL: 0.9 mg/dL (ref 0.3–1.2)
BUN: 40 mg/dL — ABNORMAL HIGH (ref 6–20)
CALCIUM: 8.4 mg/dL — AB (ref 8.9–10.3)
CHLORIDE: 105 mmol/L (ref 101–111)
CO2: 24 mmol/L (ref 22–32)
CREATININE: 2.24 mg/dL — AB (ref 0.61–1.24)
GFR, EST AFRICAN AMERICAN: 30 mL/min — AB (ref >60)
GFR, EST NON AFRICAN AMERICAN: 26 mL/min — AB (ref >60)
Glucose, Bld: 204 mg/dL — ABNORMAL HIGH (ref 65–99)
Potassium: 3.7 mmol/L (ref 3.5–5.1)
Sodium: 138 mmol/L (ref 135–145)
TOTAL PROTEIN: 6.2 g/dL — AB (ref 6.5–8.1)

## 2016-02-09 LAB — GLUCOSE, CAPILLARY
GLUCOSE-CAPILLARY: 246 mg/dL — AB (ref 65–99)
Glucose-Capillary: 256 mg/dL — ABNORMAL HIGH (ref 65–99)

## 2016-02-09 LAB — SURGICAL PCR SCREEN
MRSA, PCR: POSITIVE — AB
Staphylococcus aureus: POSITIVE — AB

## 2016-02-09 MED ORDER — MORPHINE SULFATE (PF) 4 MG/ML IV SOLN
2.0000 mg | Freq: Once | INTRAVENOUS | Status: AC
  Administered 2016-02-09: 2 mg via INTRAVENOUS
  Filled 2016-02-09: qty 1

## 2016-02-09 MED ORDER — CHLORHEXIDINE GLUCONATE CLOTH 2 % EX PADS
6.0000 | MEDICATED_PAD | Freq: Every day | CUTANEOUS | Status: DC
  Administered 2016-02-10 – 2016-02-13 (×3): 6 via TOPICAL

## 2016-02-09 MED ORDER — HYDRALAZINE HCL 25 MG PO TABS
25.0000 mg | ORAL_TABLET | Freq: Three times a day (TID) | ORAL | Status: DC
  Administered 2016-02-09 – 2016-02-11 (×5): 25 mg via ORAL
  Filled 2016-02-09: qty 1
  Filled 2016-02-09: qty 2
  Filled 2016-02-09 (×3): qty 1

## 2016-02-09 MED ORDER — ONDANSETRON HCL 4 MG/2ML IJ SOLN: 4.0000 mg | Freq: Four times a day (QID) | INTRAMUSCULAR | Status: DC | PRN

## 2016-02-09 MED ORDER — FLUTICASONE PROPIONATE 50 MCG/ACT NA SUSP
2.0000 | Freq: Every day | NASAL | Status: DC
  Administered 2016-02-09 – 2016-02-13 (×4): 2 via NASAL
  Filled 2016-02-09: qty 16

## 2016-02-09 MED ORDER — HYDRALAZINE HCL 20 MG/ML IJ SOLN: 10.0000 mg | Freq: Four times a day (QID) | INTRAMUSCULAR | Status: DC | PRN

## 2016-02-09 MED ORDER — ALBUTEROL SULFATE HFA 108 (90 BASE) MCG/ACT IN AERS: 2.0000 | INHALATION_SPRAY | Freq: Four times a day (QID) | RESPIRATORY_TRACT | Status: DC | PRN

## 2016-02-09 MED ORDER — CIPROFLOXACIN HCL 500 MG PO TABS
250.0000 mg | ORAL_TABLET | Freq: Two times a day (BID) | ORAL | Status: DC
  Administered 2016-02-09: 250 mg via ORAL
  Filled 2016-02-09: qty 1

## 2016-02-09 MED ORDER — INSULIN DETEMIR 100 UNIT/ML ~~LOC~~ SOLN
10.0000 [IU] | Freq: Every day | SUBCUTANEOUS | Status: DC
  Administered 2016-02-10 – 2016-02-13 (×3): 10 [IU] via SUBCUTANEOUS
  Filled 2016-02-09 (×5): qty 0.1

## 2016-02-09 MED ORDER — HYDRALAZINE HCL 20 MG/ML IJ SOLN
INTRAMUSCULAR | Status: AC
  Administered 2016-02-09: 20 mg via INTRAVENOUS
  Filled 2016-02-09: qty 1

## 2016-02-09 MED ORDER — HYDROCODONE-ACETAMINOPHEN 5-325 MG PO TABS
1.0000 | ORAL_TABLET | ORAL | Status: DC | PRN
  Administered 2016-02-09 – 2016-02-11 (×5): 2 via ORAL
  Administered 2016-02-11 – 2016-02-13 (×3): 1 via ORAL
  Filled 2016-02-09: qty 1
  Filled 2016-02-09 (×2): qty 2
  Filled 2016-02-09: qty 1
  Filled 2016-02-09 (×3): qty 2
  Filled 2016-02-09: qty 1

## 2016-02-09 MED ORDER — ALBUTEROL SULFATE (2.5 MG/3ML) 0.083% IN NEBU: 2.5000 mg | INHALATION_SOLUTION | Freq: Four times a day (QID) | RESPIRATORY_TRACT | Status: DC | PRN

## 2016-02-09 MED ORDER — CLONIDINE HCL 0.1 MG PO TABS
0.1000 mg | ORAL_TABLET | Freq: Two times a day (BID) | ORAL | Status: DC
  Administered 2016-02-09 – 2016-02-10 (×3): 0.1 mg via ORAL
  Filled 2016-02-09 (×3): qty 1

## 2016-02-09 MED ORDER — INSULIN LISPRO 100 UNIT/ML (KWIKPEN): 5.0000 [IU] | PEN_INJECTOR | Freq: Two times a day (BID) | SUBCUTANEOUS | Status: DC

## 2016-02-09 MED ORDER — SODIUM CHLORIDE 0.9 % IV SOLN
INTRAVENOUS | Status: DC
  Administered 2016-02-09 – 2016-02-10 (×2): via INTRAVENOUS

## 2016-02-09 MED ORDER — MOMETASONE FURO-FORMOTEROL FUM 200-5 MCG/ACT IN AERO
2.0000 | INHALATION_SPRAY | Freq: Two times a day (BID) | RESPIRATORY_TRACT | Status: DC
  Administered 2016-02-09 – 2016-02-13 (×8): 2 via RESPIRATORY_TRACT
  Filled 2016-02-09: qty 8.8

## 2016-02-09 MED ORDER — DOCUSATE SODIUM 100 MG PO CAPS
100.0000 mg | ORAL_CAPSULE | Freq: Two times a day (BID) | ORAL | Status: DC
  Administered 2016-02-09 – 2016-02-10 (×2): 100 mg via ORAL
  Filled 2016-02-09 (×2): qty 1

## 2016-02-09 MED ORDER — ONDANSETRON HCL 4 MG PO TABS: 4.0000 mg | ORAL_TABLET | Freq: Four times a day (QID) | ORAL | Status: DC | PRN

## 2016-02-09 MED ORDER — ACETAMINOPHEN 325 MG PO TABS: 650.0000 mg | ORAL_TABLET | Freq: Four times a day (QID) | ORAL | Status: DC | PRN

## 2016-02-09 MED ORDER — BISACODYL 5 MG PO TBEC
5.0000 mg | DELAYED_RELEASE_TABLET | Freq: Every day | ORAL | Status: DC | PRN
  Filled 2016-02-09: qty 1

## 2016-02-09 MED ORDER — VITAMIN D3 50 MCG (2000 UT) PO CAPS: 1.0000 | ORAL_CAPSULE | Freq: Every day | ORAL | Status: DC

## 2016-02-09 MED ORDER — MUPIROCIN 2 % EX OINT
1.0000 "application " | TOPICAL_OINTMENT | Freq: Two times a day (BID) | CUTANEOUS | Status: DC
  Administered 2016-02-09 – 2016-02-13 (×8): 1 via NASAL
  Filled 2016-02-09: qty 22

## 2016-02-09 MED ORDER — MORPHINE SULFATE (PF) 4 MG/ML IV SOLN
2.0000 mg | INTRAVENOUS | Status: DC | PRN
  Administered 2016-02-09: 2 mg via INTRAVENOUS
  Filled 2016-02-09: qty 1

## 2016-02-09 MED ORDER — SIMVASTATIN 20 MG PO TABS
20.0000 mg | ORAL_TABLET | Freq: Every day | ORAL | Status: DC
  Administered 2016-02-11 – 2016-02-12 (×2): 20 mg via ORAL
  Filled 2016-02-09 (×2): qty 1

## 2016-02-09 MED ORDER — DONEPEZIL HCL 5 MG PO TABS
5.0000 mg | ORAL_TABLET | Freq: Every day | ORAL | Status: DC
  Administered 2016-02-09 – 2016-02-12 (×4): 5 mg via ORAL
  Filled 2016-02-09 (×4): qty 1

## 2016-02-09 MED ORDER — TRAZODONE HCL 50 MG PO TABS: 25.0000 mg | ORAL_TABLET | Freq: Every evening | ORAL | Status: DC | PRN

## 2016-02-09 MED ORDER — ACETAMINOPHEN 650 MG RE SUPP: 650.0000 mg | Freq: Four times a day (QID) | RECTAL | Status: DC | PRN

## 2016-02-09 MED ORDER — TERAZOSIN HCL 5 MG PO CAPS
10.0000 mg | ORAL_CAPSULE | Freq: Every day | ORAL | Status: DC
  Administered 2016-02-09 – 2016-02-13 (×5): 10 mg via ORAL
  Filled 2016-02-09 (×6): qty 2

## 2016-02-09 MED ORDER — VITAMIN D 1000 UNITS PO TABS
2000.0000 [IU] | ORAL_TABLET | Freq: Every day | ORAL | Status: DC
  Administered 2016-02-09 – 2016-02-13 (×5): 2000 [IU] via ORAL
  Filled 2016-02-09 (×5): qty 2

## 2016-02-09 MED ORDER — INSULIN DETEMIR 100 UNIT/ML FLEXPEN: 10.0000 [IU] | PEN_INJECTOR | Freq: Every day | SUBCUTANEOUS | Status: DC

## 2016-02-09 MED ORDER — HYDRALAZINE HCL 20 MG/ML IJ SOLN
20.0000 mg | Freq: Once | INTRAMUSCULAR | Status: AC
  Administered 2016-02-09: 20 mg via INTRAVENOUS

## 2016-02-09 MED ORDER — INSULIN ASPART 100 UNIT/ML ~~LOC~~ SOLN
0.0000 [IU] | Freq: Three times a day (TID) | SUBCUTANEOUS | Status: DC
  Administered 2016-02-10: 5 [IU] via SUBCUTANEOUS
  Administered 2016-02-10: 1 [IU] via SUBCUTANEOUS
  Filled 2016-02-09: qty 5

## 2016-02-09 MED ORDER — LOPERAMIDE HCL 2 MG PO CAPS: 2.0000 mg | ORAL_CAPSULE | ORAL | Status: DC | PRN

## 2016-02-09 MED ORDER — CEFAZOLIN SODIUM-DEXTROSE 2-4 GM/100ML-% IV SOLN
2.0000 g | INTRAVENOUS | Status: DC
  Filled 2016-02-09: qty 100

## 2016-02-09 MED ORDER — ENOXAPARIN SODIUM 40 MG/0.4ML ~~LOC~~ SOLN: 40.0000 mg | SUBCUTANEOUS | Status: DC

## 2016-02-09 MED ORDER — ACETAMINOPHEN 500 MG PO TABS: 500.0000 mg | ORAL_TABLET | Freq: Four times a day (QID) | ORAL | Status: DC | PRN

## 2016-02-09 MED ORDER — LISINOPRIL 20 MG PO TABS
40.0000 mg | ORAL_TABLET | Freq: Once | ORAL | Status: AC
  Administered 2016-02-09: 40 mg via ORAL
  Filled 2016-02-09: qty 2

## 2016-02-09 MED ORDER — SODIUM CHLORIDE 0.9 % IV BOLUS (SEPSIS)
500.0000 mL | Freq: Once | INTRAVENOUS | Status: AC
  Administered 2016-02-09: 500 mL via INTRAVENOUS

## 2016-02-09 NOTE — Progress Notes (Signed)
Pt admitted to room 146.  Pt vomited 2x with large amount of bright red blood/streaks. Pt states he has not eaten today. He also claims he hit the back of his head yesterday when he fell. No hematoma, no open areas noted. Informed md. Ordered head ct.

## 2016-02-09 NOTE — ED Notes (Signed)
Patient last ate/drank at 1900 last night.

## 2016-02-09 NOTE — H&P (Signed)
[facility] [place] [facility]   PATIENT NAME: Patrick Morrison    MR#:  [id number]  DATE OF BIRTH:  1934-01-31  DATE OF ADMISSION:  02/09/2016  PRIMARY CARE PHYSICIAN: Rennis HardingMEREDITH, MICHAEL W, MD   REQUESTING/REFERRING PHYSICIAN: Dr. Alphonzo LemmingsMcShane  CHIEF COMPLAINT: Fall    Chief Complaint  Patient presents with  . Fall    HISTORY OF PRESENT ILLNESS:  Patrick Morrison  is a 82 y.o. male with a known history of BPH, hypertension, diabetes mellitus, dementia, brought in by family because of the fall, unable to ambulate because of pain in the left leg. No loss of consciousness, no chest pain, no dizziness. Patient just lost her balance and then had a fall. Patient fell yesterday. Found to have a left hip fracture, admitting the patient for that. Blood pressure is elevated to 200s over 120. Admitting the patient to telemetry, started on now clonidine, hydralazine, spoke with Dr. Guilford ShiPoggi,hopefully  If bp is  stable he can have surgery tomorrow afternoon. Tentatively scheduled at 3 PM tomorrow afternoon for hip surgery.  PAST MEDICAL HISTORY:   Past Medical History:  Diagnosis Date  . Abnormal echocardiogram   . Asymptomatic bilateral carotid artery stenosis   . Bilateral knee pain   . BPH (benign prostatic hyperplasia)   . Bruit of right carotid artery   . Chronic rhinitis   . COPD (chronic obstructive pulmonary disease) (HCC)   . Diabetic macular edema (HCC)   . DM (diabetes mellitus) (HCC)   . Dysplasia of prostate   . Edema   . Hyperlipidemia   . Hypertension   . NPDR (nonproliferative diabetic retinopathy) (HCC)   . Osteoarthritis   . Personal history of other malignant neoplasm of skin   . Primary osteoarthritis of both knees   . Pseudophakia of both eyes     PAST SURGICAL HISTOIRY:   Past Surgical History:  Procedure Laterality Date  . ANKLE SURGERY    . COLECTOMY    . COLON SURGERY      SOCIAL HISTORY:   Social History  Substance Use Topics  .  Smoking status: Former Smoker    Packs/day: 1.00    Years: 60.00    Types: Cigarettes    Quit date: 11/24/2003  . Smokeless tobacco: Never Used  . Alcohol use No     Comment: occasional beer    FAMILY HISTORY:  No family history on file.  DRUG ALLERGIES:  No Known Allergies  REVIEW OF SYSTEMS:  CONSTITUTIONAL: No fever, fatigue or weakness.  EYES: No blurred or double vision.  EARS, NOSE, AND THROAT: No tinnitus or ear pain.  RESPIRATORY: No cough, shortness of breath, wheezing or hemoptysis.  CARDIOVASCULAR: No chest pain, orthopnea, edema.  GASTROINTESTINAL: No nausea, vomiting, diarrhea or abdominal pain.  GENITOURINARY: No dysuria, hematuria.  ENDOCRINE: No polyuria, nocturia,  HEMATOLOGY: No anemia, easy bruising or bleeding SKIN: No rash or lesion. MUSCULOSKELETAL: Left hip pain. NEUROLOGIC: No tingling, numbness, weakness.  PSYCHIATRY: No anxiety or depression.   MEDICATIONS AT HOME:   Prior to Admission medications   Medication Sig Start Date End Date Taking? Authorizing Provider  acetaminophen (TYLENOL) 500 MG tablet Take 500 mg by mouth every 6 (six) hours as needed.   Yes Historical Provider, MD  acidophilus (RISAQUAD) CAPS capsule Take by mouth daily.   Yes Historical Provider, MD  ADVAIR DISKUS 250-50 MCG/DOSE AEPB Take 1 puff by mouth 2 (two) times daily. 01/26/15  Yes Historical Provider, MD  albuterol (PROAIR  HFA) 108 (90 Base) MCG/ACT inhaler Inhale 2 puffs into the lungs every 6 (six) hours as needed. 03/26/14  Yes Historical Provider, MD  Cholecalciferol (VITAMIN D3) 2000 units capsule Take 1 capsule by mouth daily.   Yes Historical Provider, MD  ciprofloxacin (CIPRO) 250 MG tablet Take 250 mg by mouth 2 (two) times daily. Not taken today 02/09/2016 02/04/16 02/13/16 Yes Historical Provider, MD  donepezil (ARICEPT) 5 MG tablet Take 5 mg by mouth at bedtime.   Yes Historical Provider, MD  fluticasone (FLONASE) 50 MCG/ACT nasal spray Place 2 sprays into both  nostrils daily.  01/12/15  Yes Historical Provider, MD  Insulin Detemir (LEVEMIR FLEXTOUCH [place]) Inject 10 Units into the skin daily.  05/11/15  Yes Historical Provider, MD  insulin lispro (HUMALOG) 100 UNIT/ML KiwkPen Inject 5 Units into the skin 2 (two) times daily. AM dosing-90-150 = 3 units 151-200=5 units >201=7 units. PM dosing-90-150=5 units 151-200=7 units >201=9 units 08/26/14 02/09/16 Yes Historical Provider, MD  lisinopril (PRINIVIL,ZESTRIL) 40 MG tablet Take 1 tablet by mouth daily. 01/26/15  Yes Historical Provider, MD  loperamide (IMODIUM) 2 MG capsule Take 2 mg by mouth as needed for diarrhea or loose stools.   Yes Historical Provider, MD  Melatonin 3 MG TABS Take 1 tablet by mouth at bedtime. 02/25/15  Yes Historical Provider, MD  simvastatin (ZOCOR) 20 MG tablet Take 1 tablet by mouth daily at 6 PM.  01/12/15  Yes Historical Provider, MD  terazosin (HYTRIN) 10 MG capsule Take 1 capsule by mouth daily. 01/12/15  Yes Historical Provider, MD      VITAL SIGNS:  Blood pressure (!) 220/91, pulse 80, resp. rate (!) 21, height 5\' 8"  (1.727 m), weight 58.1 kg (128 lb), SpO2 93 %.  PHYSICAL EXAMINATION:  GENERAL:  82 y.o.-year-old patient lying in the bed with no acute distress.  EYES: Pupils equal, round, reactive to light and accommodation. No scleral icterus. Extraocular muscles intact.  HEENT: Head atraumatic, normocephalic. Oropharynx and nasopharynx clear.  NECK:  Supple, no jugular venous distention. No thyroid enlargement, no tenderness.  LUNGS: Normal breath sounds bilaterally, no wheezing, rales,rhonchi or crepitation. No use of accessory muscles of respiration.  CARDIOVASCULAR: S1, S2 normal. No murmurs, rubs, or gallops.  ABDOMEN: Soft, nontender, nondistended. Bowel sounds present. No organomegaly or mass.  EXTREMITIES: No pedal edema, cyanosis, or clubbing.  NEUROLOGIC: Cranial nerves II through XII are intact. Muscle strength 5/5 in all extremities. Sensation intact. Gait  not checked.  PSYCHIATRIC: The patient is alert and oriented x 3.  SKIN: No obvious rash, lesion, or ulcer.   LABORATORY PANEL:   CBC  Recent Labs Lab 02/09/16 1250  WBC 10.7*  HGB 11.0*  HCT 32.9*  PLT 182   ------------------------------------------------------------------------------------------------------------------  Chemistries   Recent Labs Lab 02/09/16 1250  NA 138  K 3.7  CL 105  CO2 24  GLUCOSE 204*  BUN 40*  CREATININE 2.24*  CALCIUM 8.4*  AST 22  ALT 14*  ALKPHOS 70  BILITOT 0.9   ------------------------------------------------------------------------------------------------------------------  Cardiac Enzymes No results for input(s): TROPONINI in the last 168 hours. ------------------------------------------------------------------------------------------------------------------  RADIOLOGY:  Dg Chest 1 View  Result Date: 02/09/2016 CLINICAL DATA:  Preop, former smoking history EXAM: CHEST 1 VIEW COMPARISON:  None. FINDINGS: The lungs are hyperaerated suggesting an element of emphysema. No pneumonia or effusion is seen. Mediastinal and hilar contours are unremarkable. The heart is within normal limits in size. The bones are somewhat osteopenic. IMPRESSION: Hyper aeration may indicate emphysema.  No active  process. Electronically Signed   By: Dwyane DeePaul  Barry M.D.   On: 02/09/2016 13:27   Dg Hip Unilat With Pelvis 2-3 Views Left  Result Date: 02/09/2016 CLINICAL DATA:  Acute left hip pain EXAM: DG HIP (WITH OR WITHOUT PELVIS) 2-3V LEFT COMPARISON:  04/01/2015 FINDINGS: There is an acute displaced and angulated left subcapital femoral neck fracture. Bones are osteopenic. Degenerative changes of both hips. Iliofemoral atherosclerosis noted. Bony pelvis appears intact. IMPRESSION: Acute displaced left subcapital femoral neck fracture. Iliofemoral atherosclerosis Osteopenia Electronically Signed   By: Judie PetitM.  Shick M.D.   On: 02/09/2016 13:28    EKG:   Orders  placed or performed during the hospital encounter of 02/09/16  . ED EKG  . ED EKG  . EKG 12-Lead  . EKG 12-Lead  Normal sinus rhythm 78 bpm no ST-T changes.  IMPRESSION AND PLAN:   #201.82 year old the male patient with fall, left femoral neck fracture.  He  has significantly elevated blood pressure with acute renal failure, he is not medically stable to have a surgery. I spoke with Dr. Joice LoftsPoggi,. Admit him to telemetry, control the blood pressure, hydrate him for renal failure, if medically stable with better control of blood pressure, improvement of kidney function tomorrow patient can have surgery tomorrow. I spoke with patient's wife also about this.,  #2 diabetes mellitus type 2: Started back on Lantus, start on ADA low-sodium diet.,start SSI with coverage. 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 #4 acute renal failure due to ATN: Does have CK D stage III, baseline creatinine is 1.4 as per the medical records / hold nephrotoxic agents.. #5 dementia #6. Ecchymosis, patient does have fragile skin. Wife  wanted me to make sure about IV access/and routine skin assesements, A as per routine.ll the records are reviewed and case discussed with ED provider. Management plans discussed with the patient, family and they are in agreement.  CODE STATUS: full  TOTAL TIME TAKING CARE OF THIS PATIENT:55 minutes.    Katha HammingKONIDENA,Dorwin Fitzhenry M.D on 02/09/2016 at 3:27 PM  Between 7am to 6pm - Pager - [phone]  After 6pm go to [email or web address] - password [email or web address]  [facility]  Office  [phone]  CC: Primary care physician; Rennis HardingMEREDITH, MICHAEL W, MD  Note: This dictation was prepared with Dragon dictation along with smaller phrase technology. Any transcriptional errors that result from this process are unintentional.

## 2016-02-09 NOTE — ED Triage Notes (Signed)
Per [facility], patient had a fall yesterday. EMS was called, when they got to the house they assisted patient back to bed, then he refused transport. Today, patient woke up and was "sore all over". Patient mostly c/o left hip and leg pain. Per EMS patient did hit his head last night. Patient denies taking any blood thinners.

## 2016-02-09 NOTE — ED Provider Notes (Signed)
Time Seen: Approximately *1241  I have reviewed the triage notes  Chief Complaint: Fall   History of Present Illness: Patrick Morrison is a 82 y.o. male fell yesterday. Patient was moved to the bed per EMS. Today the patient was all over and cannot ambulate on his left lower extremity. Patient states he did have some head trauma but no loss of consciousness no neck pain no nausea vomiting etc.   Past Medical History:  Diagnosis Date  . Abnormal echocardiogram   . Asymptomatic bilateral carotid artery stenosis   . Bilateral knee pain   . BPH (benign prostatic hyperplasia)   . Bruit of right carotid artery   . Chronic rhinitis   . COPD (chronic obstructive pulmonary disease) (HCC)   . Diabetic macular edema (HCC)   . DM (diabetes mellitus) (HCC)   . Dysplasia of prostate   . Edema   . Hyperlipidemia   . Hypertension   . NPDR (nonproliferative diabetic retinopathy) (HCC)   . Osteoarthritis   . Personal history of other malignant neoplasm of skin   . Primary osteoarthritis of both knees   . Pseudophakia of both eyes     There are no active problems to display for this patient.   Past Surgical History:  Procedure Laterality Date  . ANKLE SURGERY    . COLECTOMY    . COLON SURGERY      Past Surgical History:  Procedure Laterality Date  . ANKLE SURGERY    . COLECTOMY    . COLON SURGERY      Current Outpatient Rx  . Order #: [id number] Class: Historical Med  . Order #: [id number] Class: Historical Med  . Order #: [id number] Class: Historical Med  . Order #: [id number] Class: Historical Med  . Order #: [id number] Class: Historical Med  . Order #: [id number] Class: Historical Med  . Order #: [id number] Class: Historical Med  . Order #: [id number] Class: Historical Med  . Order #: [id number] Class: Historical Med  . Order #: [id number] Class: Historical Med  . Order #: [id number] Class: Historical Med  . Order #: [id number] Class: Historical Med  . Order #: [id number] Class: Historical Med   . Order #: [id number] Class: Historical Med  . Order #: [id number] Class: Historical Med    Allergies:  Patient has no known allergies.  Family History: No family history on file.  Social History: Social History  Substance Use Topics  . Smoking status: Former Smoker    Packs/day: 1.00    Years: 60.00    Types: Cigarettes    Quit date: 11/24/2003  . Smokeless tobacco: Never Used  . Alcohol use No     Comment: occasional beer     Review of Systems:   10 point review of systems was performed and was otherwise negative:  Constitutional: No fever Eyes: No visual disturbances ENT: No sore throat, ear pain Cardiac: No chest pain Respiratory: No shortness of breath, wheezing, or stridor Abdomen: No abdominal pain, no vomiting, No diarrhea Endocrine: No weight loss, No night sweats Extremities: No peripheral edema, cyanosis Skin: No rashes, easy bruising Neurologic: No focal weakness, trouble with speech or swollowing Urologic: No dysuria, Hematuria, or urinary frequency   Physical Exam:  ED Triage Vitals  Enc Vitals Group     BP 02/09/16 1231 (!) 219/101     Pulse Rate 02/09/16 1231 85     Resp 02/09/16 1231 18     Temp --      Temp src --      SpO2 02/09/16 1231 93 %  Weight 02/09/16 1236 128 lb (58.1 kg)     Height 02/09/16 1236 5\' 8"  (1.727 m)     Head Circumference --      Peak Flow --      Pain Score 02/09/16 1237 7     Pain Loc --      Pain Edu? --      Excl. in GC? --     General: Awake , Alert , and Oriented times 2, Glasgow Coma Scale 15 Head: Normal cephalic , atraumatic Eyes: Pupils equal , round, reactive to light Nose/Throat: No nasal drainage, patent upper airway without erythema or exudate.  Neck: Supple, Full range of motion, No anterior adenopathy or palpable thyroid masses Lungs: Clear to ascultation without wheezes , rhonchi, or rales Heart: Regular rate, regular rhythm without murmurs , gallops , or rubs Abdomen: Soft, non tender without  rebound, guarding , or rigidity; bowel sounds positive and symmetric in all 4 quadrants. No organomegaly .        Extremities: Patient has tenderness with any form of flexion varus or valgus rotation of the left hip. Neurologic: normal ambulation, Motor symmetric without deficits, sensory intact Skin: warm, dry, no rashes   Labs:   All laboratory work was reviewed including any pertinent negatives or positives listed below:  Labs Reviewed  CBC WITH DIFFERENTIAL/PLATELET - Abnormal; Notable for the following:       Result Value   WBC 10.7 (*)    RBC 3.37 (*)    Hemoglobin 11.0 (*)    HCT 32.9 (*)    Neutro Abs 9.4 (*)    Lymphs Abs 0.6 (*)    All other components within normal limits  COMPREHENSIVE METABOLIC PANEL - Abnormal; Notable for the following:    Glucose, Bld 204 (*)    BUN 40 (*)    Creatinine, Ser 2.24 (*)    Calcium 8.4 (*)    Total Protein 6.2 (*)    Albumin 3.2 (*)    ALT 14 (*)    GFR calc non Af Amer 26 (*)    GFR calc Af Amer 30 (*)    All other components within normal limits    EKG:  ED ECG REPORT I, Patrick MoccasinBrian S Lars Morrison, the attending physician, personally viewed and interpreted this ECG.  Date: 02/09/2016 EKG Time: 1441 Rate: 78 Rhythm: normal sinus rhythm QRS Axis: normal Intervals: normal ST/T Wave abnormalities: Nonspecific ST and T-wave abnormalities with poor quality EKG Conduction Disturbances: none Narrative Interpretation: unremarkable No obvious acute ischemic changes   Radiology: * "Dg Chest 1 View  Result Date: 02/09/2016 CLINICAL DATA:  Preop, former smoking history EXAM: CHEST 1 VIEW COMPARISON:  None. FINDINGS: The lungs are hyperaerated suggesting an element of emphysema. No pneumonia or effusion is seen. Mediastinal and hilar contours are unremarkable. The heart is within normal limits in size. The bones are somewhat osteopenic. IMPRESSION: Hyper aeration may indicate emphysema.  No active process. Electronically Signed   By: Dwyane DeePaul   Barry M.D.   On: 02/09/2016 13:27   Dg Hip Unilat With Pelvis 2-3 Views Left  Result Date: 02/09/2016 CLINICAL DATA:  Acute left hip pain EXAM: DG HIP (WITH OR WITHOUT PELVIS) 2-3V LEFT COMPARISON:  04/01/2015 FINDINGS: There is an acute displaced and angulated left subcapital femoral neck fracture. Bones are osteopenic. Degenerative changes of both hips. Iliofemoral atherosclerosis noted. Bony pelvis appears intact. IMPRESSION: Acute displaced left subcapital femoral neck fracture. Iliofemoral atherosclerosis Osteopenia Electronically Signed   By: Judie PetitM.  Shick  M.D.   On: 02/09/2016 13:28  "  I personally reviewed the radiologic studies    ED Course:  Patient's been nothing by mouth since yesterday and has a history of diabetes. Of note is his blood pressure being elevated and has not had his normal blood pressure medication this morning at this time. She has an obvious left-sided subcapital femur fracture will require surgical management. Clinical Course      Assessment:  Left hip fracture closed Hypertension   Final Clinical Impression:  Final diagnoses:  Left hip pain  Closed fracture of left hip, initial encounter [facility])     Plan:  Outpatient Patient was advised to return immediately if condition worsens. Patient was advised to follow up with their primary care physician or other specialized physicians involved in their outpatient care. The patient and/or family member/power of attorney had laboratory results reviewed at the bedside. All questions and concerns were addressed and appropriate discharge instructions were distributed by the nursing staff.             Patrick Moccasin, MD 02/09/16 [phone]

## 2016-02-09 NOTE — Consult Note (Signed)
ORTHOPAEDIC CONSULTATION  REQUESTING PHYSICIAN: Jennye MoccasinBrian S Quigley, MD  Chief Complaint:   Left hip pain.  History of Present Illness: Patrick Morrison is a 82 y.o. male with multiple medical problems including diabetes, hypertension, hyperlipidemia, COPD, atherosclerotic disease, BPH, and osteoarthritis who lives with his wife independently. Apparently, the patient lost his balance and fell while in his bathroom earlier today and landed on his left hip. He was unable to get up. The EMS squad was called and he was brought to the emergency room where x-rays demonstrated a displaced left femoral neck fracture. The patient denies any associated injuries, but does not recall exactly why he fell. He did not strike his head or lose consciousness. He also denies any lightheadedness, dizziness, chest pain, shortness of breath, or other symptoms may have precipitated his fall. According to his wife, the patient recently has completed a course of physical therapy to try to improve his mobility as he had become quite sedentary.  Past Medical History:  Diagnosis Date  . Abnormal echocardiogram   . Asymptomatic bilateral carotid artery stenosis   . Bilateral knee pain   . BPH (benign prostatic hyperplasia)   . Bruit of right carotid artery   . Chronic rhinitis   . COPD (chronic obstructive pulmonary disease) (HCC)   . Diabetic macular edema (HCC)   . DM (diabetes mellitus) (HCC)   . Dysplasia of prostate   . Edema   . Hyperlipidemia   . Hypertension   . NPDR (nonproliferative diabetic retinopathy) (HCC)   . Osteoarthritis   . Personal history of other malignant neoplasm of skin   . Primary osteoarthritis of both knees   . Pseudophakia of both eyes    Past Surgical History:  Procedure Laterality Date  . ANKLE SURGERY    . COLECTOMY    . COLON SURGERY     Social History   Social History  . Marital status: Married    Spouse name: N/A   . Number of children: N/A  . Years of education: N/A   Social History Main Topics  . Smoking status: Former Smoker    Packs/day: 1.00    Years: 60.00    Types: Cigarettes    Quit date: 11/24/2003  . Smokeless tobacco: Never Used  . Alcohol use No     Comment: occasional beer  . Drug use: No  . Sexual activity: Not Asked   Other Topics Concern  . None   Social History Narrative  . None   No family history on file. No Known Allergies Prior to Admission medications   Medication Sig Start Date End Date Taking? Authorizing Provider  acetaminophen (TYLENOL) 500 MG tablet Take 500 mg by mouth every 6 (six) hours as needed.   Yes Historical Provider, MD  acidophilus (RISAQUAD) CAPS capsule Take by mouth daily.   Yes Historical Provider, MD  ADVAIR DISKUS 250-50 MCG/DOSE AEPB Take 1 puff by mouth 2 (two) times daily. 01/26/15  Yes Historical Provider, MD  albuterol (PROAIR HFA) 108 (90 Base) MCG/ACT inhaler Inhale 2 puffs into the lungs every 6 (six) hours as needed. 03/26/14  Yes Historical Provider, MD  Cholecalciferol (VITAMIN D3) 2000 units capsule Take 1 capsule by mouth daily.   Yes Historical Provider, MD  ciprofloxacin (CIPRO) 250 MG tablet Take 250 mg by mouth 2 (two) times daily. Not taken today 02/09/2016 02/04/16 02/13/16 Yes Historical Provider, MD  donepezil (ARICEPT) 5 MG tablet Take 5 mg by mouth at bedtime.   Yes Historical Provider,  MD  fluticasone (FLONASE) 50 MCG/ACT nasal spray Place 2 sprays into both nostrils daily.  01/12/15  Yes Historical Provider, MD  Insulin Detemir (LEVEMIR FLEXTOUCH [place]) Inject 10 Units into the skin daily.  05/11/15  Yes Historical Provider, MD  insulin lispro (HUMALOG) 100 UNIT/ML KiwkPen Inject 5 Units into the skin 2 (two) times daily. AM dosing-90-150 = 3 units 151-200=5 units >201=7 units. PM dosing-90-150=5 units 151-200=7 units >201=9 units 08/26/14 02/09/16 Yes Historical Provider, MD  lisinopril (PRINIVIL,ZESTRIL) 40 MG tablet Take 1  tablet by mouth daily. 01/26/15  Yes Historical Provider, MD  loperamide (IMODIUM) 2 MG capsule Take 2 mg by mouth as needed for diarrhea or loose stools.   Yes Historical Provider, MD  Melatonin 3 MG TABS Take 1 tablet by mouth at bedtime. 02/25/15  Yes Historical Provider, MD  simvastatin (ZOCOR) 20 MG tablet Take 1 tablet by mouth daily at 6 PM.  01/12/15  Yes Historical Provider, MD  terazosin (HYTRIN) 10 MG capsule Take 1 capsule by mouth daily. 01/12/15  Yes Historical Provider, MD   Dg Chest 1 View  Result Date: 02/09/2016 CLINICAL DATA:  Preop, former smoking history EXAM: CHEST 1 VIEW COMPARISON:  None. FINDINGS: The lungs are hyperaerated suggesting an element of emphysema. No pneumonia or effusion is seen. Mediastinal and hilar contours are unremarkable. The heart is within normal limits in size. The bones are somewhat osteopenic. IMPRESSION: Hyper aeration may indicate emphysema.  No active process. Electronically Signed   By: Dwyane DeePaul  Barry M.D.   On: 02/09/2016 13:27   Dg Hip Unilat With Pelvis 2-3 Views Left  Result Date: 02/09/2016 CLINICAL DATA:  Acute left hip pain EXAM: DG HIP (WITH OR WITHOUT PELVIS) 2-3V LEFT COMPARISON:  04/01/2015 FINDINGS: There is an acute displaced and angulated left subcapital femoral neck fracture. Bones are osteopenic. Degenerative changes of both hips. Iliofemoral atherosclerosis noted. Bony pelvis appears intact. IMPRESSION: Acute displaced left subcapital femoral neck fracture. Iliofemoral atherosclerosis Osteopenia Electronically Signed   By: Judie PetitM.  Shick M.D.   On: 02/09/2016 13:28    Positive ROS: All other systems have been reviewed and were otherwise negative with the exception of those mentioned in the HPI and as above.  Physical Exam: General:  Alert, no acute distress Psychiatric:  Patient is competent for consent with normal mood and affect   Cardiovascular:  No pedal edema Respiratory:  No wheezing, non-labored breathing GI:  Abdomen is soft  and non-tender Skin:  No lesions in the area of chief complaint Neurologic:  Sensation intact distally Lymphatic:  No axillary or cervical lymphadenopathy  Orthopedic Exam:  Orthopedic examination is limited to the left hip and lower extremity. The left lower extremity is somewhat shortened and externally rotated as compared to the right. Skin inspection around the left hip is unremarkable. There is no swelling, erythema, ecchymosis, or operations. He has mild tenderness to firm palpation over the lateral aspect of the left hip. He has more significant pain with any attempted active or passive motion of the left hip. He is neurovascularly intact to the left lower extremity and foot as he can actively dorsiflex and plantarflex his toes. Sensation is intact to light touch to all distributions. He has a 1-2 dorsalis pedis pulse with good capillary refill to all digits.  X-rays:  X-rays of the pelvis and left hip are available for review. These films demonstrate a Garden 3 displaced left femoral neck fracture. No significant degenerative changes of the hip joint and noted and no lytic  lesions of the femoral neck or intertrochanteric region are noted.  Assessment: Displaced left femoral neck fracture.  Plan: The treatment options are discussed with the patient and his wife, who is at the bedside, including both surgical and nonsurgical options. After discussing these options, the patient and his wife would like to proceed with a left hip hemiarthroplasty. This procedure has been discussed in detail, as have the potential risks (including bleeding, infection, nerve and/or blood vessel injury, persistent or recurrent pain, stiffness, loosening of and/or failure of the components, dislocation, leg length inequality, need for further surgery, blood clots, strokes, heart attacks and/or arrhythmias, etc.) and benefits. The patient states his understanding and wishes to proceed. A formal written consent will be  obtained by the nursing staff. We will proceed with the operation once he has been cleared medically, presumably tomorrow afternoon.  Thank you for ask me to participate in the care of this most unfortunate yet pleasant gentleman. I will be happy to follow him with you.   Maryagnes Amos, MD  Beeper #:  [phone]  02/09/2016 3:07 PM

## 2016-02-10 ENCOUNTER — Inpatient Hospital Stay: Payer: Medicare Other | Admitting: Registered Nurse

## 2016-02-10 ENCOUNTER — Inpatient Hospital Stay: Payer: Medicare Other

## 2016-02-10 ENCOUNTER — Encounter
Admission: EM | Disposition: A | Discharge status: Skilled Nursing Facility | Payer: Self-pay | Source: Home / Self Care | Attending: Internal Medicine

## 2016-02-10 HISTORY — PX: HIP ARTHROPLASTY: SHX981

## 2016-02-10 LAB — BASIC METABOLIC PANEL
ANION GAP: 7 (ref 5–15)
BUN: 43 mg/dL — ABNORMAL HIGH (ref 6–20)
CALCIUM: 7.9 mg/dL — AB (ref 8.9–10.3)
CO2: 25 mmol/L (ref 22–32)
Chloride: 109 mmol/L (ref 101–111)
Creatinine, Ser: 2.43 mg/dL — ABNORMAL HIGH (ref 0.61–1.24)
GFR calc Af Amer: 27 mL/min — ABNORMAL LOW (ref >60)
GFR, EST NON AFRICAN AMERICAN: 24 mL/min — AB (ref >60)
GLUCOSE: 264 mg/dL — AB (ref 65–99)
Potassium: 3.3 mmol/L — ABNORMAL LOW (ref 3.5–5.1)
Sodium: 141 mmol/L (ref 135–145)

## 2016-02-10 LAB — CBC
HCT: 27.8 % — ABNORMAL LOW (ref 40.0–52.0)
HEMOGLOBIN: 9.5 g/dL — AB (ref 13.0–18.0)
MCH: 33.2 pg (ref 26.0–34.0)
MCHC: 34.1 g/dL (ref 32.0–36.0)
MCV: 97.3 fL (ref 80.0–100.0)
PLATELETS: 151 10*3/uL (ref 150–440)
RBC: 2.85 MIL/uL — ABNORMAL LOW (ref 4.40–5.90)
RDW: 12.8 % (ref 11.5–14.5)
WBC: 8 10*3/uL (ref 3.8–10.6)

## 2016-02-10 LAB — GLUCOSE, CAPILLARY
GLUCOSE-CAPILLARY: 255 mg/dL — AB (ref 65–99)
Glucose-Capillary: 137 mg/dL — ABNORMAL HIGH (ref 65–99)
Glucose-Capillary: 121 mg/dL — ABNORMAL HIGH (ref 65–99)
Glucose-Capillary: 176 mg/dL — ABNORMAL HIGH (ref 65–99)

## 2016-02-10 LAB — POTASSIUM: POTASSIUM: 3.7 mmol/L (ref 3.5–5.1)

## 2016-02-10 LAB — TYPE AND SCREEN
ABO/RH(D): O POS
ANTIBODY SCREEN: NEGATIVE

## 2016-02-10 SURGERY — HEMIARTHROPLASTY, HIP, DIRECT ANTERIOR APPROACH, FOR FRACTURE
Anesthesia: General | Laterality: Left | Wound class: Clean

## 2016-02-10 MED ORDER — CEFAZOLIN SODIUM-DEXTROSE 2-3 GM-% IV SOLR
INTRAVENOUS | Status: DC | PRN
  Administered 2016-02-10: 2 g via INTRAVENOUS

## 2016-02-10 MED ORDER — ACETAMINOPHEN 10 MG/ML IV SOLN
INTRAVENOUS | Status: DC | PRN
  Administered 2016-02-10: 1000 mg via INTRAVENOUS

## 2016-02-10 MED ORDER — TRANEXAMIC ACID 1000 MG/10ML IV SOLN
INTRAVENOUS | Status: AC
  Filled 2016-02-10: qty 10

## 2016-02-10 MED ORDER — SUCCINYLCHOLINE CHLORIDE 20 MG/ML IJ SOLN
INTRAMUSCULAR | Status: DC | PRN
  Administered 2016-02-10: 100 mg via INTRAVENOUS

## 2016-02-10 MED ORDER — TRANEXAMIC ACID 1000 MG/10ML IV SOLN
INTRAVENOUS | Status: DC | PRN
  Administered 2016-02-10: 1000 mg via TOPICAL

## 2016-02-10 MED ORDER — BUPIVACAINE-EPINEPHRINE (PF) 0.25% -1:200000 IJ SOLN
INTRAMUSCULAR | Status: DC | PRN
  Administered 2016-02-10: 30 mL

## 2016-02-10 MED ORDER — PHENYLEPHRINE HCL 10 MG/ML IJ SOLN
INTRAMUSCULAR | Status: AC
  Filled 2016-02-10: qty 1

## 2016-02-10 MED ORDER — SODIUM CHLORIDE 0.9 % IV SOLN
INTRAVENOUS | Status: DC | PRN
  Administered 2016-02-10: 15:00:00 via INTRAVENOUS

## 2016-02-10 MED ORDER — CEFAZOLIN SODIUM-DEXTROSE 2-4 GM/100ML-% IV SOLN
2.0000 g | Freq: Four times a day (QID) | INTRAVENOUS | Status: AC
  Administered 2016-02-10 – 2016-02-11 (×3): 2 g via INTRAVENOUS
  Filled 2016-02-10 (×4): qty 100

## 2016-02-10 MED ORDER — EPHEDRINE SULFATE 50 MG/ML IJ SOLN
INTRAMUSCULAR | Status: DC | PRN
  Administered 2016-02-10 (×2): 10 mg via INTRAVENOUS

## 2016-02-10 MED ORDER — CIPROFLOXACIN HCL 500 MG PO TABS: 500.0000 mg | ORAL_TABLET | ORAL | Status: DC

## 2016-02-10 MED ORDER — BUPIVACAINE LIPOSOME 1.3 % IJ SUSP
INTRAMUSCULAR | Status: AC
  Filled 2016-02-10: qty 20

## 2016-02-10 MED ORDER — SODIUM CHLORIDE 0.9 % IJ SOLN
INTRAMUSCULAR | Status: AC
  Filled 2016-02-10: qty 50

## 2016-02-10 MED ORDER — ONDANSETRON HCL 4 MG PO TABS: 4.0000 mg | ORAL_TABLET | Freq: Four times a day (QID) | ORAL | Status: DC | PRN

## 2016-02-10 MED ORDER — ACETAMINOPHEN 500 MG PO TABS
1000.0000 mg | ORAL_TABLET | Freq: Four times a day (QID) | ORAL | Status: AC
  Administered 2016-02-10 – 2016-02-11 (×3): 1000 mg via ORAL
  Filled 2016-02-10 (×4): qty 2

## 2016-02-10 MED ORDER — CIPROFLOXACIN HCL 500 MG PO TABS
500.0000 mg | ORAL_TABLET | Freq: Every day | ORAL | Status: DC
  Administered 2016-02-10 – 2016-02-11 (×2): 500 mg via ORAL
  Filled 2016-02-10 (×2): qty 1

## 2016-02-10 MED ORDER — PANTOPRAZOLE SODIUM 40 MG PO TBEC
40.0000 mg | DELAYED_RELEASE_TABLET | Freq: Every day | ORAL | Status: DC
  Administered 2016-02-10 – 2016-02-13 (×4): 40 mg via ORAL
  Filled 2016-02-10 (×4): qty 1

## 2016-02-10 MED ORDER — SUGAMMADEX SODIUM 200 MG/2ML IV SOLN
INTRAVENOUS | Status: DC | PRN
  Administered 2016-02-10: 120 mg via INTRAVENOUS

## 2016-02-10 MED ORDER — ENOXAPARIN SODIUM 30 MG/0.3ML ~~LOC~~ SOLN
30.0000 mg | SUBCUTANEOUS | Status: DC
  Administered 2016-02-11 – 2016-02-13 (×3): 30 mg via SUBCUTANEOUS
  Filled 2016-02-10 (×3): qty 0.3

## 2016-02-10 MED ORDER — ACETAMINOPHEN 650 MG RE SUPP: 650.0000 mg | Freq: Four times a day (QID) | RECTAL | Status: DC | PRN

## 2016-02-10 MED ORDER — NEOMYCIN-POLYMYXIN B GU 40-200000 IR SOLN
Status: DC | PRN
  Administered 2016-02-10: 16 mL

## 2016-02-10 MED ORDER — LIDOCAINE HCL (CARDIAC) 20 MG/ML IV SOLN
INTRAVENOUS | Status: DC | PRN
  Administered 2016-02-10: 100 mg via INTRAVENOUS

## 2016-02-10 MED ORDER — FERROUS SULFATE 325 (65 FE) MG PO TABS
325.0000 mg | ORAL_TABLET | Freq: Three times a day (TID) | ORAL | Status: DC
  Administered 2016-02-10 – 2016-02-13 (×6): 325 mg via ORAL
  Filled 2016-02-10 (×6): qty 1

## 2016-02-10 MED ORDER — PROPOFOL 10 MG/ML IV BOLUS
INTRAVENOUS | Status: DC | PRN
  Administered 2016-02-10: 130 mg via INTRAVENOUS

## 2016-02-10 MED ORDER — DIPHENHYDRAMINE HCL 12.5 MG/5ML PO ELIX: 12.5000 mg | ORAL_SOLUTION | ORAL | Status: DC | PRN

## 2016-02-10 MED ORDER — ACETAMINOPHEN 10 MG/ML IV SOLN
INTRAVENOUS | Status: AC
  Filled 2016-02-10: qty 100

## 2016-02-10 MED ORDER — METOCLOPRAMIDE HCL 5 MG/ML IJ SOLN
5.0000 mg | Freq: Three times a day (TID) | INTRAMUSCULAR | Status: DC | PRN
Start: 2016-02-10 — End: 2016-02-13

## 2016-02-10 MED ORDER — DOCUSATE SODIUM 100 MG PO CAPS
100.0000 mg | ORAL_CAPSULE | Freq: Two times a day (BID) | ORAL | Status: DC
  Administered 2016-02-10 – 2016-02-13 (×6): 100 mg via ORAL
  Filled 2016-02-10 (×6): qty 1

## 2016-02-10 MED ORDER — INSULIN ASPART 100 UNIT/ML ~~LOC~~ SOLN
0.0000 [IU] | Freq: Three times a day (TID) | SUBCUTANEOUS | Status: DC
  Administered 2016-02-11: 2 [IU] via SUBCUTANEOUS
  Administered 2016-02-11: 3 [IU] via SUBCUTANEOUS
  Administered 2016-02-12: 5 [IU] via SUBCUTANEOUS
  Administered 2016-02-12: 2 [IU] via SUBCUTANEOUS
  Filled 2016-02-10: qty 5
  Filled 2016-02-10: qty 3
  Filled 2016-02-10 (×2): qty 2

## 2016-02-10 MED ORDER — METOCLOPRAMIDE HCL 10 MG PO TABS
5.0000 mg | ORAL_TABLET | Freq: Three times a day (TID) | ORAL | Status: DC | PRN
Start: 2016-02-10 — End: 2016-02-13

## 2016-02-10 MED ORDER — ONDANSETRON HCL 4 MG/2ML IJ SOLN: 4.0000 mg | Freq: Once | INTRAMUSCULAR | Status: DC | PRN

## 2016-02-10 MED ORDER — BISACODYL 10 MG RE SUPP
10.0000 mg | Freq: Every day | RECTAL | Status: DC | PRN
  Administered 2016-02-13: 10 mg via RECTAL
  Filled 2016-02-10: qty 1

## 2016-02-10 MED ORDER — ROCURONIUM BROMIDE 100 MG/10ML IV SOLN
INTRAVENOUS | Status: DC | PRN
  Administered 2016-02-10: 10 mg via INTRAVENOUS
  Administered 2016-02-10: 5 mg via INTRAVENOUS
  Administered 2016-02-10: 35 mg via INTRAVENOUS

## 2016-02-10 MED ORDER — FENTANYL CITRATE (PF) 100 MCG/2ML IJ SOLN
INTRAMUSCULAR | Status: DC | PRN
  Administered 2016-02-10 (×3): 50 ug via INTRAVENOUS

## 2016-02-10 MED ORDER — ONDANSETRON HCL 4 MG/2ML IJ SOLN
INTRAMUSCULAR | Status: DC | PRN
  Administered 2016-02-10: 4 mg via INTRAVENOUS

## 2016-02-10 MED ORDER — BUPIVACAINE-EPINEPHRINE (PF) 0.25% -1:200000 IJ SOLN
INTRAMUSCULAR | Status: AC
  Filled 2016-02-10: qty 30

## 2016-02-10 MED ORDER — SODIUM CHLORIDE 0.9 % IV SOLN
INTRAVENOUS | Status: DC
  Administered 2016-02-11: 05:00:00 via INTRAVENOUS

## 2016-02-10 MED ORDER — POTASSIUM CHLORIDE 20 MEQ PO PACK
40.0000 meq | PACK | ORAL | Status: AC
  Administered 2016-02-10 (×2): 40 meq via ORAL
  Filled 2016-02-10 (×2): qty 2

## 2016-02-10 MED ORDER — ONDANSETRON HCL 4 MG/2ML IJ SOLN: 4.0000 mg | Freq: Four times a day (QID) | INTRAMUSCULAR | Status: DC | PRN

## 2016-02-10 MED ORDER — PHENYLEPHRINE HCL 10 MG/ML IJ SOLN
INTRAMUSCULAR | Status: DC | PRN
  Administered 2016-02-10 (×8): 100 ug via INTRAVENOUS
  Administered 2016-02-10: 200 ug via INTRAVENOUS

## 2016-02-10 MED ORDER — FLEET ENEMA 7-19 GM/118ML RE ENEM: 1.0000 | ENEMA | Freq: Once | RECTAL | Status: DC | PRN

## 2016-02-10 MED ORDER — NEOMYCIN-POLYMYXIN B GU 40-200000 IR SOLN
Status: AC
  Filled 2016-02-10: qty 20

## 2016-02-10 MED ORDER — SODIUM CHLORIDE 0.9 % IV SOLN
INTRAVENOUS | Status: DC | PRN
  Administered 2016-02-10: 60 mL

## 2016-02-10 MED ORDER — ACETAMINOPHEN 325 MG PO TABS: 650.0000 mg | ORAL_TABLET | Freq: Four times a day (QID) | ORAL | Status: DC | PRN

## 2016-02-10 MED ORDER — FENTANYL CITRATE (PF) 100 MCG/2ML IJ SOLN: 25.0000 ug | INTRAMUSCULAR | Status: DC | PRN

## 2016-02-10 MED ORDER — MAGNESIUM HYDROXIDE 400 MG/5ML PO SUSP: 30.0000 mL | Freq: Every day | ORAL | Status: DC | PRN

## 2016-02-10 SURGICAL SUPPLY — 55 items
BAG DECANTER FOR FLEXI CONT (MISCELLANEOUS) IMPLANT
BLADE SAGITTAL WIDE XTHICK NO (BLADE) ×3 IMPLANT
BLADE SURG SZ20 CARB STEEL (BLADE) ×3 IMPLANT
BNDG COHESIVE 6X5 TAN STRL LF (GAUZE/BANDAGES/DRESSINGS) ×3 IMPLANT
BOWL CEMENT MIXING ADV NOZZLE (MISCELLANEOUS) IMPLANT
CANISTER SUCT 1200ML W/VALVE (MISCELLANEOUS) ×3 IMPLANT
CANISTER SUCT 3000ML (MISCELLANEOUS) ×6 IMPLANT
CAPT HIP HEMI 2 ×3 IMPLANT
CHLORAPREP W/TINT 26ML (MISCELLANEOUS) ×6 IMPLANT
DECANTER SPIKE VIAL GLASS SM (MISCELLANEOUS) ×6 IMPLANT
DRAPE IMP U-DRAPE 54X76 (DRAPES) ×6 IMPLANT
DRAPE INCISE IOBAN 66X60 STRL (DRAPES) ×3 IMPLANT
DRAPE SHEET LG 3/4 BI-LAMINATE (DRAPES) ×3 IMPLANT
DRAPE SURG 17X11 SM STRL (DRAPES) ×3 IMPLANT
DRAPE SURG 17X23 STRL (DRAPES) ×3 IMPLANT
DRSG OPSITE POSTOP 4X12 (GAUZE/BANDAGES/DRESSINGS) ×3 IMPLANT
DRSG OPSITE POSTOP 4X14 (GAUZE/BANDAGES/DRESSINGS) ×3 IMPLANT
ELECT BLADE 6.5 EXT (BLADE) ×3 IMPLANT
ELECT CAUTERY BLADE 6.4 (BLADE) ×3 IMPLANT
ELECT REM PT RETURN 9FT ADLT (ELECTROSURGICAL) ×3
ELECTRODE REM PT RTRN 9FT ADLT (ELECTROSURGICAL) ×1 IMPLANT
GAUZE PACK 2X3YD (MISCELLANEOUS) IMPLANT
GLOVE BIO SURGEON STRL SZ8 (GLOVE) ×6 IMPLANT
GLOVE INDICATOR 8.0 STRL GRN (GLOVE) ×3 IMPLANT
GOWN STRL REUS W/ TWL LRG LVL3 (GOWN DISPOSABLE) ×1 IMPLANT
GOWN STRL REUS W/ TWL XL LVL3 (GOWN DISPOSABLE) ×1 IMPLANT
GOWN STRL REUS W/TWL LRG LVL3 (GOWN DISPOSABLE) ×2
GOWN STRL REUS W/TWL XL LVL3 (GOWN DISPOSABLE) ×2
HANDPIECE INTERPULSE COAX TIP (DISPOSABLE) ×2
HOOD PEEL AWAY FLYTE STAYCOOL (MISCELLANEOUS) ×6 IMPLANT
IV NS 100ML SINGLE PACK (IV SOLUTION) IMPLANT
LABEL OR SOLS (LABEL) ×3 IMPLANT
NDL SAFETY 18GX1.5 (NEEDLE) ×3 IMPLANT
NEEDLE FILTER BLUNT 18X 1/2SAF (NEEDLE) ×2
NEEDLE FILTER BLUNT 18X1 1/2 (NEEDLE) ×1 IMPLANT
NEEDLE SPNL 20GX3.5 QUINCKE YW (NEEDLE) ×3 IMPLANT
NS IRRIG 1000ML POUR BTL (IV SOLUTION) ×3 IMPLANT
PACK HIP PROSTHESIS (MISCELLANEOUS) ×3 IMPLANT
PILLOW ABDUC SM (MISCELLANEOUS) ×3 IMPLANT
SET HNDPC FAN SPRY TIP SCT (DISPOSABLE) ×1 IMPLANT
SOL .9 NS 3000ML IRR  AL (IV SOLUTION) ×4
SOL .9 NS 3000ML IRR UROMATIC (IV SOLUTION) ×2 IMPLANT
STAPLER SKIN PROX 35W (STAPLE) ×3 IMPLANT
STRAP SAFETY BODY (MISCELLANEOUS) ×3 IMPLANT
SUT ETHIBOND 2 V 37 (SUTURE) ×9 IMPLANT
SUT VIC AB 1 CT1 36 (SUTURE) ×6 IMPLANT
SUT VIC AB 2-0 CT1 (SUTURE) ×9 IMPLANT
SUT VIC AB 2-0 CT1 27 (SUTURE) ×6
SUT VIC AB 2-0 CT1 TAPERPNT 27 (SUTURE) ×3 IMPLANT
SUT VICRYL 1-0 27IN ABS (SUTURE) ×6
SUTURE VICRYL 1-0 27IN ABS (SUTURE) ×2 IMPLANT
SYR 30ML LL (SYRINGE) ×9 IMPLANT
SYR TB 1ML 27GX1/2 LL (SYRINGE) IMPLANT
SYRINGE 10CC LL (SYRINGE) ×3 IMPLANT
TAPE TRANSPORE STRL 2 31045 (GAUZE/BANDAGES/DRESSINGS) ×3 IMPLANT

## 2016-02-10 NOTE — Transfer of Care (Signed)
Immediate Anesthesia Transfer of Care Note  Patient: Patrick Morrison  Procedure(s) Performed: Procedure(s): ARTHROPLASTY BIPOLAR HIP (HEMIARTHROPLASTY) (Left)  Patient Location: PACU  Anesthesia Type:General  Level of Consciousness: sedated  Airway & Oxygen Therapy: Patient connected to face mask oxygen  Post-op Assessment: Post -op Vital signs reviewed and stable  Post vital signs: stable  Last Vitals:  Vitals:   02/10/16 1233 02/10/16 1729  BP: 140/68 (!) 115/52  Pulse: 80 81  Resp: 16 14  Temp: (!) 36 C 36.4 C    Last Pain:  Vitals:   02/10/16 1729  TempSrc: Temporal  PainSc:          Complications: No apparent anesthesia complications

## 2016-02-10 NOTE — Progress Notes (Signed)
Initial Nutrition Assessment  DOCUMENTATION CODES:   Severe malnutrition in context of chronic illness  INTERVENTION:  1. Magic cup TID with meals, each supplement provides 290 kcal and 9 grams of protein  NUTRITION DIAGNOSIS:   Malnutrition related to chronic illness as evidenced by severe depletion of muscle mass, severe depletion of body fat.  GOAL:   Patient will meet greater than or equal to 90% of their needs  MONITOR:   PO intake, I & O's, Labs, Weight trends, Supplement acceptance  REASON FOR ASSESSMENT:   Low Braden    ASSESSMENT:   Patrick Morrison  is a 82 y.o. male with a known history of BPH, hypertension, diabetes mellitus, dementia, brought in by family because of the fall, unable to ambulate because of pain in the left leg  Spoke with patient, pt's wife at bedside. Then endorse patient eats 3 meals per day at home, biggest of which is breakfast where he consumes 3 eggs, a slice of ham, and orange juice. They state his meals get smaller as the day goes on. He reports weight loss from 160# to 120# over "a number of months." Per care-everywhere  Patient denies any issues chewing/swalloweing or choking. Wife states patient has some "coughing spells" though she states he also has COPD, unsure if this is related to liquid/food intake.   Nutrition-Focused physical exam completed. Findings are severe fat depletion, severe muscle depletion, and no edema.   He is to have surgery today. Labs and medications reviewed: K 3.3 Vitamin D, Colace NS @ 14900mL/hr  Diet Order:  Diet NPO time specified  Skin:  Reviewed, no issues  Last BM:  PTA  Height:   Ht Readings from Last 1 Encounters:  02/09/16 5\' 8"  (1.727 m)    Weight:   Wt Readings from Last 1 Encounters:  02/09/16 128 lb (58.1 kg)    Ideal Body Weight:     BMI:  Body mass index is 19.46 kg/m.  Estimated Nutritional Needs:   Kcal:  1450-1750 calories  Protein:  58-70 gm  Fluid:  >/=  1.5L  EDUCATION NEEDS:   No education needs identified at this time  Dionne AnoWilliam M. Morrissa Shein, MS, RD LDN Inpatient Clinical Dietitian Pager [phone]

## 2016-02-10 NOTE — Progress Notes (Signed)
[facility] - [place] [facility]   PATIENT NAME: Patrick Morrison    MR#:  [id number]  DATE OF BIRTH:  02-Feb-1934  SUBJECTIVE:  CHIEF COMPLAINT:   Chief Complaint  Patient presents with  . Fall   Complains of pain in his left hip. Waiting for surgery. Poor historian. Discussed with wife.  REVIEW OF SYSTEMS:    Review of Systems  Unable to perform ROS: Dementia    DRUG ALLERGIES:  No Known Allergies  VITALS:  Blood pressure (!) 150/62, pulse 80, temperature 97.8 F (36.6 C), temperature source Oral, resp. rate 16, height 5\' 8"  (1.727 m), weight 58.1 kg (128 lb), SpO2 92 %.  PHYSICAL EXAMINATION:   Physical Exam  GENERAL:  82 y.o.-year-old patient lying in the bed with no acute distress.  EYES: Pupils equal, round, reactive to light and accommodation. No scleral icterus. Extraocular muscles intact.  HEENT: Head atraumatic, normocephalic. Oropharynx and nasopharynx clear.  NECK:  Supple, no jugular venous distention. No thyroid enlargement, no tenderness.  LUNGS: Normal breath sounds bilaterally, no wheezing, rales, rhonchi. No use of accessory muscles of respiration.  CARDIOVASCULAR: S1, S2 normal. No murmurs, rubs, or gallops.  ABDOMEN: Soft, nontender, nondistended. Bowel sounds present. No organomegaly or mass.  EXTREMITIES: No cyanosis, clubbing or edema b/l.   Left hip  tenderness NEUROLOGIC: Cranial nerves II through XII are intact. No focal Motor or sensory deficits b/l.   PSYCHIATRIC: The patient is Awake and pleasantly confused SKIN: No obvious rash, lesion, or ulcer.   LABORATORY PANEL:   CBC  Recent Labs Lab 02/10/16 0418  WBC 8.0  HGB 9.5*  HCT 27.8*  PLT 151   ------------------------------------------------------------------------------------------------------------------ Chemistries   Recent Labs Lab 02/09/16 1250 02/10/16 0418  NA 138 141  K 3.7 3.3*  CL 105 109  CO2 24 25  GLUCOSE 204* 264*  BUN 40* 43*  CREATININE  2.24* 2.43*  CALCIUM 8.4* 7.9*  AST 22  --   ALT 14*  --   ALKPHOS 70  --   BILITOT 0.9  --    ------------------------------------------------------------------------------------------------------------------  Cardiac Enzymes No results for input(s): TROPONINI in the last 168 hours. ------------------------------------------------------------------------------------------------------------------  RADIOLOGY:  Dg Chest 1 View  Result Date: 02/09/2016 CLINICAL DATA:  Preop, former smoking history EXAM: CHEST 1 VIEW COMPARISON:  None. FINDINGS: The lungs are hyperaerated suggesting an element of emphysema. No pneumonia or effusion is seen. Mediastinal and hilar contours are unremarkable. The heart is within normal limits in size. The bones are somewhat osteopenic. IMPRESSION: Hyper aeration may indicate emphysema.  No active process. Electronically Signed   By: Dwyane DeePaul  Barry M.D.   On: 02/09/2016 13:27   Ct Head Wo Contrast  Result Date: 02/09/2016 CLINICAL DATA:  Hematemesis. Larey SeatFell and hit back of head yesterday. History of diabetes. EXAM: CT HEAD WITHOUT CONTRAST TECHNIQUE: Contiguous axial images were obtained from the base of the skull through the vertex without intravenous contrast. COMPARISON:  CT HEAD Aug 14, 2015 and CT HEAD April 01, 2015 FINDINGS: BRAIN: Moderate to severe ventriculomegaly, predominately on the basis of global parenchymal brain volume loss with near commensurate enlargement of cerebral sulci and cerebellar folia, unchanged. Confluent supratentorial white matter hypodensities without midline shift, mass effect or acute large vascular territory infarcts. No abnormal echoes extra-axial fluid collections. VASCULAR: Moderate calcific atherosclerosis of the carotid siphons. SKULL: No skull fracture. No significant scalp soft tissue swelling. SINUSES/ORBITS: Trace paranasal sinus mucosal thickening. Mastoid air cells are well aerated. The included ocular globes  and orbital  contents are non-suspicious. OTHER: None. IMPRESSION: No acute intracranial process. Stable moderate to severe global brain atrophy with possible component of normal pressure hydrocephalus. Stable severe chronic small vessel ischemic disease. Electronically Signed   By: Awilda Metroourtnay  Bloomer M.D.   On: 02/09/2016 18:44   Dg Hip Unilat With Pelvis 2-3 Views Left  Result Date: 02/09/2016 CLINICAL DATA:  Acute left hip pain EXAM: DG HIP (WITH OR WITHOUT PELVIS) 2-3V LEFT COMPARISON:  04/01/2015 FINDINGS: There is an acute displaced and angulated left subcapital femoral neck fracture. Bones are osteopenic. Degenerative changes of both hips. Iliofemoral atherosclerosis noted. Bony pelvis appears intact. IMPRESSION: Acute displaced left subcapital femoral neck fracture. Iliofemoral atherosclerosis Osteopenia Electronically Signed   By: Judie PetitM.  Shick M.D.   On: 02/09/2016 13:28     ASSESSMENT AND PLAN:   # left femoral neck fracture.   Surgery later today. PT after surgery. DVT prophylaxis as per orthopedics. Pain medications as needed. 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  # Acute kidney injury over CKD stage III Start on IV fluids. Repeat labs in the morning. Monitor input and output.  # dementia Watch for inpatient delirium.  All the records are reviewed and case discussed with Care Management/Social Workerr. Management plans discussed with the patient, family and they are in agreement.  CODE STATUS: FULL CODE  DVT Prophylaxis: SCDs  TOTAL TIME TAKING CARE OF THIS PATIENT: 30 minutes.   POSSIBLE D/C IN 2-3 DAYS, DEPENDING ON CLINICAL CONDITION.  Milagros LollSudini, Caedyn Tassinari R M.D on 02/10/2016 at 11:57 AM  Between 7am to 6pm - Pager -  [phone]  After 6pm go to [email or web address] - password [email or web address]  [facility] [place] [facility]  Office  [phone]  CC: Primary care physician; Rennis HardingMEREDITH, MICHAEL W, MD  Note: This dictation was prepared with Dragon dictation along with smaller phrase technology. Any transcriptional errors that result from this process are unintentional.

## 2016-02-10 NOTE — Anesthesia Procedure Notes (Signed)
Procedure Name: Intubation Date/Time: 02/10/2016 3:44 PM Performed by: Irving BurtonBACHICH, Wende Longstreth Pre-anesthesia Checklist: Patient identified, Emergency Drugs available, Suction available and Patient being monitored Patient Re-evaluated:Patient Re-evaluated prior to inductionOxygen Delivery Method: Circle system utilized Preoxygenation: Pre-oxygenation with 100% oxygen Intubation Type: IV induction Ventilation: Oral airway inserted - appropriate to patient size and Mask ventilation without difficulty Laryngoscope Size: Mac and 4 Grade View: Grade I Tube type: Oral Tube size: 8.0 mm Number of attempts: 1 (Tube was replaced. Audible cuff leak.) Airway Equipment and Method: Stylet Secured at: 23 cm Tube secured with: Tape Dental Injury: Teeth and Oropharynx as per pre-operative assessment

## 2016-02-10 NOTE — Anesthesia Preprocedure Evaluation (Signed)
Anesthesia Evaluation  Patient identified by MRN, date of birth, ID band Patient awake    Reviewed: Allergy & Precautions, NPO status , Patient's Chart, lab work & pertinent test results  History of Anesthesia Complications Negative for: history of anesthetic complications  Airway Mallampati: III       Dental   Pulmonary COPD,  COPD inhaler, former smoker,           Cardiovascular hypertension, Pt. on medications + Peripheral Vascular Disease       Neuro/Psych    GI/Hepatic   Endo/Other  diabetes, Insulin Dependent  Renal/GU      Musculoskeletal  (+) Arthritis ,   Abdominal   Peds  Hematology   Anesthesia Other Findings   Reproductive/Obstetrics                            Anesthesia Physical Anesthesia Plan  ASA: III  Anesthesia Plan: General   Post-op Pain Management:    Induction: Intravenous  Airway Management Planned: Oral ETT  Additional Equipment:   Intra-op Plan:   Post-operative Plan:   Informed Consent: I have reviewed the patients History and Physical, chart, labs and discussed the procedure including the risks, benefits and alternatives for the proposed anesthesia with the patient or authorized representative who has indicated his/her understanding and acceptance.     Plan Discussed with:   Anesthesia Plan Comments:         Anesthesia Quick Evaluation

## 2016-02-10 NOTE — Anesthesia Postprocedure Evaluation (Signed)
Anesthesia Post Note  Patient: Patrick Morrison  Procedure(s) Performed: Procedure(s) (LRB): ARTHROPLASTY BIPOLAR HIP (HEMIARTHROPLASTY) (Left)  Patient location during evaluation: PACU Anesthesia Type: General Level of consciousness: awake and alert Pain management: pain level controlled Vital Signs Assessment: post-procedure vital signs reviewed and stable Respiratory status: spontaneous breathing and respiratory function stable Cardiovascular status: stable Anesthetic complications: no    Last Vitals:  Vitals:   02/10/16 1233 02/10/16 1729  BP: 140/68 (!) 115/52  Pulse: 80 81  Resp: 16 14  Temp: (!) 36 C 36.4 C    Last Pain:  Vitals:   02/10/16 1729  TempSrc: Temporal  PainSc:                  KEPHART,WILLIAM K

## 2016-02-10 NOTE — NC FL2 (Signed)
[place] MEDICAID FL2 LEVEL OF CARE SCREENING TOOL     IDENTIFICATION  Patient Name: Patrick SoloWendell E Issa Birthdate: 04-14-1933 Sex: male Admission Date (Current Location): 02/09/2016  [place]   Facility and Address:  [facility], [address], [place], [place] [address]      Provider Number: [id number]  Attending Physician Name and Address:  Milagros LollSrikar Sudini, MD  Relative Name and Phone Number:       Current Level of Care: Hospital Recommended Level of Care: Skilled Nursing Facility Prior Approval Number:    Date Approved/Denied:   PASRR Number:  [id number]  Discharge Plan: SNF    Current Diagnoses: Patient Active Problem List   Diagnosis Date Noted  . Closed left hip fracture, initial encounter (HCC) 02/09/2016    Orientation RESPIRATION BLADDER Height & Weight     Self, Time  Normal Incontinent, Indwelling catheter Weight: 128 lb (58.1 kg) Height:  5\' 8"  (172.7 cm)  BEHAVIORAL SYMPTOMS/MOOD NEUROLOGICAL BOWEL NUTRITION STATUS   (none)  (none) Continent Diet (NPO for surgery. )  AMBULATORY STATUS COMMUNICATION OF NEEDS Skin   Extensive Assist Verbally Surgical wounds                       Personal Care Assistance Level of Assistance  Bathing, Feeding, Dressing Bathing Assistance: Limited assistance Feeding assistance: Independent Dressing Assistance: Limited assistance     Functional Limitations Info  Sight, Hearing, Speech Sight Info: Adequate Hearing Info: Adequate Speech Info: Adequate    SPECIAL CARE FACTORS FREQUENCY  PT (By licensed PT), OT (By licensed OT)     PT Frequency:  (5) OT Frequency:  (5)            Contractures      Additional Factors Info  Code Status, Allergies, Insulin Sliding Scale, Isolation Precautions Code Status Info:  (Full Code. ) Allergies Info:  (No Known Allergies. )   Insulin Sliding Scale Info:  (NovoLog Insulin Injections ) Isolation Precautions  Info:  (MRSA Nasal Swab. )     Current Medications (02/10/2016):  This is the current hospital active medication list Current Facility-Administered Medications  Medication Dose Route Frequency Provider Last Rate Last Dose  . 0.9 %  sodium chloride infusion   Intravenous Continuous Katha HammingSnehalatha Konidena, MD 100 mL/hr at 02/10/16 [id number]    . acetaminophen (TYLENOL) tablet 650 mg  650 mg Oral Q6H PRN Katha HammingSnehalatha Konidena, MD       Or  . acetaminophen (TYLENOL) suppository 650 mg  650 mg Rectal Q6H PRN Katha HammingSnehalatha Konidena, MD      . albuterol (PROVENTIL) (2.5 MG/3ML) 0.083% nebulizer solution 2.5 mg  2.5 mg Nebulization Q6H PRN Katha HammingSnehalatha Konidena, MD      . bisacodyl (DULCOLAX) EC tablet 5 mg  5 mg Oral Daily PRN Katha HammingSnehalatha Konidena, MD      . ceFAZolin (ANCEF) IVPB 2g/100 mL premix  2 g Intravenous 30 min Pre-Op Christena FlakeJohn J Poggi, MD      . Chlorhexidine Gluconate Cloth 2 % PADS 6 each  6 each Topical Q0600 Katha HammingSnehalatha Konidena, MD   6 each at 02/10/16 0526  . cholecalciferol (VITAMIN D) tablet 2,000 Units  2,000 Units Oral Daily Katha HammingSnehalatha Konidena, MD   2,000 Units at 02/10/16 0836  . ciprofloxacin (CIPRO) tablet 500 mg  500 mg Oral Daily Srikar Sudini, MD      . cloNIDine (CATAPRES) tablet 0.1 mg  0.1 mg Oral BID Katha HammingSnehalatha Konidena, MD  0.1 mg at 02/10/16 0836  . docusate sodium (COLACE) capsule 100 mg  100 mg Oral BID Katha HammingSnehalatha Konidena, MD   100 mg at 02/10/16 0837  . donepezil (ARICEPT) tablet 5 mg  5 mg Oral QHS Katha HammingSnehalatha Konidena, MD   5 mg at 02/09/16 2112  . fluticasone (FLONASE) 50 MCG/ACT nasal spray 2 spray  2 spray Each Nare Daily Katha HammingSnehalatha Konidena, MD   2 spray at 02/10/16 0936  . hydrALAZINE (APRESOLINE) injection 10 mg  10 mg Intravenous Q6H PRN Katha HammingSnehalatha Konidena, MD      . hydrALAZINE (APRESOLINE) tablet 25 mg  25 mg Oral Q8H Katha HammingSnehalatha Konidena, MD   25 mg at 02/10/16 1331  . HYDROcodone-acetaminophen (NORCO/VICODIN) 5-325 MG per tablet 1-2 tablet  1-2 tablet Oral Q4H PRN  Katha HammingSnehalatha Konidena, MD   2 tablet at 02/10/16 0512  . insulin aspart (novoLOG) injection 0-9 Units  0-9 Units Subcutaneous TID WC Katha HammingSnehalatha Konidena, MD   1 Units at 02/10/16 1248  . insulin detemir (LEVEMIR) injection 10 Units  10 Units Subcutaneous Daily Katha HammingSnehalatha Konidena, MD   10 Units at 02/10/16 0837  . loperamide (IMODIUM) capsule 2 mg  2 mg Oral PRN Katha HammingSnehalatha Konidena, MD      . mometasone-formoterol (DULERA) 200-5 MCG/ACT inhaler 2 puff  2 puff Inhalation BID Katha HammingSnehalatha Konidena, MD   2 puff at 02/10/16 [id number]  . morphine 4 MG/ML injection 2 mg  2 mg Intravenous Q4H PRN Katha HammingSnehalatha Konidena, MD   2 mg at 02/09/16 1948  . mupirocin ointment (BACTROBAN) 2 % 1 application  1 application Nasal BID Katha HammingSnehalatha Konidena, MD   1 application at 02/10/16 0936  . ondansetron (ZOFRAN) tablet 4 mg  4 mg Oral Q6H PRN Katha HammingSnehalatha Konidena, MD       Or  . ondansetron (ZOFRAN) injection 4 mg  4 mg Intravenous Q6H PRN Katha HammingSnehalatha Konidena, MD      . simvastatin (ZOCOR) tablet 20 mg  20 mg Oral q1800 Katha HammingSnehalatha Konidena, MD      . terazosin (HYTRIN) capsule 10 mg  10 mg Oral Daily Katha HammingSnehalatha Konidena, MD   10 mg at 02/10/16 0836  . traZODone (DESYREL) tablet 25 mg  25 mg Oral QHS PRN Katha HammingSnehalatha Konidena, MD         Discharge Medications: Please see discharge summary for a list of discharge medications.  Relevant Imaging Results:  Relevant Lab Results:   Additional Information  (SSN: [id number])  Keimya Briddell, Darleen CrockerBailey M, LCSW

## 2016-02-10 NOTE — Clinical Social Work Note (Signed)
Clinical Social Work Assessment  Patient Details  Name: Patrick Morrison MRN: [id number] Date of Birth: 06/12/1933  Date of referral:  02/10/16               Reason for consult:  Facility Placement                Permission sought to share information with:  Chartered certified accountant granted to share information::  Yes, Verbal Permission Granted  Name::      [place]::   [place]   Relationship::     Contact Information:     Housing/Transportation Living arrangements for the past 2 months:  [place] of Information:  Patient, Spouse Patient Interpreter Needed:  None Criminal Activity/Legal Involvement Pertinent to Current Situation/Hospitalization:  No - Comment as needed Significant Relationships:  Adult Children, Spouse Lives with:  Spouse Do you feel safe going back to the place where you live?  Yes Need for family participation in patient care:  Yes (Comment)  Care giving concerns:  Patient lives in [place] with his wife Stanton Kidney [phone].    Social Worker assessment / plan:  Holiday representative ([place]) received verbal consult from RN in progression rounds this morning that patient will have surgery today and will likely need SNF. CSW met with patient and his wife Stanton Kidney prior to surgery today. Patient was laying in the bed and was oriented to self and time. Patient appeared confused about where he was at. Per wife patient she lives with her in [place] and they have 3 adult children. CSW explained that PT will work with patient after surgery and make a recommendation of home health or SNF. CSW also explained that patient's insurance Blue Medicare will have to approve SNF stay. Patient and wife verbalized their understanding and are agreeable to SNF search in [place]. Per wife patient has been to [facility] in the past and he prefers to go back there for rehab.   FL2 complete and faxed out. CSW will continue to  follow and assist as needed.    Employment status:  Retired Nurse, adult PT Recommendations:  Not assessed at this time Information / Referral to community resources:  [place]  Patient/Family's Response to care:  Patient and wife are agreeable to [facility].   Patient/Family's Understanding of and Emotional Response to Diagnosis, Current Treatment, and Prognosis:  Patient and wife were pleasant and thanked CSW for assistance.   Emotional Assessment Appearance:  Appears stated age Attitude/Demeanor/Rapport:    Affect (typically observed):  Accepting, Adaptable, Pleasant Orientation:  Oriented to Self, Fluctuating Orientation (Suspected and/or reported Sundowners), Oriented to  Time Alcohol / Substance use:  Not Applicable Psych involvement (Current and /or in the community):  No (Comment)  Discharge Needs  Concerns to be addressed:  Discharge Planning Concerns Readmission within the last 30 days:  No Current discharge risk:  Chronically ill, Cognitively Impaired, Dependent with Mobility Barriers to Discharge:  Continued Medical Work up   [employer], Veronia Beets, LCSW 02/10/2016, 3:06 PM

## 2016-02-10 NOTE — Clinical Social Work Placement (Signed)
   CLINICAL SOCIAL WORK PLACEMENT  NOTE  Date:  02/10/2016  Patient Details  Name: Patrick Morrison MRN: [id number] Date of Birth: 06/09/1933  Clinical Social Work is seeking post-discharge placement for this patient at the Skilled  Nursing Facility level of care (*CSW will initial, date and re-position this form in  chart as items are completed):  Yes   Patient/family provided with [place] Clinical Social Work Department's list of facilities offering this level of care within the geographic area requested by the patient (or if unable, by the patient's family).  Yes   Patient/family informed of their freedom to choose among providers that offer the needed level of care, that participate in Medicare, Medicaid or managed care program needed by the patient, have an available bed and are willing to accept the patient.  Yes   Patient/family informed of [place]'s ownership interest in [facility] and [facility], as well as of the fact that they are under no obligation to receive care at these facilities.  PASRR submitted to EDS on  ([place] was down and PASARR could not be submitted)     PASRR number received on       Existing PASRR number confirmed on       FL2 transmitted to all facilities in geographic area requested by pt/family on 02/10/16     FL2 transmitted to all facilities within larger geographic area on       Patient informed that his/her managed care company has contracts with or will negotiate with certain facilities, including the following:            Patient/family informed of bed offers received.  Patient chooses bed at       Physician recommends and patient chooses bed at      Patient to be transferred to   on  .  Patient to be transferred to facility by       Patient family notified on   of transfer.  Name of family member notified:        PHYSICIAN       Additional Comment:    _______________________________________________ Sharnika Binney,  Darleen CrockerBailey M, LCSW 02/10/2016, 3:05 PM

## 2016-02-10 NOTE — Op Note (Signed)
02/09/2016 - 02/10/2016  5:16 PM  Patient:   Patrick Morrison  Pre-Op Diagnosis:   Displaced femoral neck fracture, left hip.  Post-Op Diagnosis:   Same.  Procedure:   Left hip unipolar hemiarthroplasty.  Surgeon:   Maryagnes AmosJ. Jeffrey Poggi, MD  Assistant:   Horris LatinoLance McGhee, PA-C  Anesthesia:   GET  Findings:   As above.  Complications:   None  EBL:   75 cc  Fluids:   800 cc crystalloid  UOP:   400 cc  TT:   None  Drains:   None  Closure:   Staples  Implants:   Biomet press-fit system with a #12 standard offset Echo femoral stem, a 46 mm outer diameter shell, a 28 mm head, and a -3 mm neck  Brief Clinical Note:   The patient is an 82 year old man who sustained the above-noted injury yesterday when he apparently lost his balance and fell in his bathroom. He was brought to the emergency room where x-rays demonstrated a displaced left femoral neck fracture. The patient has been cleared medically and presents at this time for definitive management of the injury.  Procedure:   The patient was brought into the operating room. After adequate general endotracheal intubation and anesthesia was obtained, the patient was repositioned in the right lateral decubitus position and secured using a lateral hip positioner. The left hip and lower extremity were prepped with ChloroPrep solution before being draped sterilely. Preoperative antibiotics were administered. A timeout was performed to verify the appropriate surgical site before a standard posterior approach to the hip was made through an approximately 4-5 inch incision. The incision was carried down through the subcutaneous tissues to expose the gluteal fascia and proximal end of the iliotibial band. These structures were split the length of the incision and the Charnley self-retaining hip retractor placed. The bursal tissues were swept posteriorly to expose the short external rotators. The anterior border of the piriformis tendon was identified and  this plane developed down through the capsule to enter the joint. Abundant fracture hematoma was suctioned. A flap of tissue was elevated off the posterior aspect of the femoral neck and greater trochanter and retracted posteriorly. This flap included the piriformis tendon, the short external rotators, and the posterior capsule. The femoral head was removed in its entirety, then taken to the back table where it was measured and found to be optimally replicated by a 46 mm head. The appropriate trial head was inserted and found to demonstrate an excellent suction fit.   Attention was directed to the femoral side. The femoral neck was recut 10-12 mm above the lesser trochanter using an oscillating saw. The piriformis fossa was debrided of soft tissues before the intramedullary canal was accessed through this point using a triple step reamer. The canal was reamed sequentially beginning with a #7 tapered reamer and progressing to a #12 tapered reamer. This provided excellent circumferential chatter. A box osteotome was used to establish version before the canal was broached sequentially beginning with a #10 broach and progressing to a #12 broach. This was left in place and several trial reductions performed. The permanent #12 standard offset femoral stem was impacted into place. A repeat trial reduction was performed using both the -6 mm and -3 mm neck lengths. The -3 mm neck length demonstrated excellent stability both in extension and external rotation as well as with flexion to 90 and internal rotation beyond 70. It also was stable in the position of sleep. The 46 mm  outer diameter shell with the -3 mm neck adapter construct was put together on the back table before being impacted onto the stem of the femoral component. The Morse taper locking mechanism was verified using manual distraction before the head was relocated and placed through a range of motion with the findings as described above.  The wound was  copiously irrigated with bacitracin saline solution via the jet lavage system before the peri-incisional and pericapsular tissues were injected with 30 cc of 0.5% Sensorcaine with epinephrine and 20 cc of Exparel diluted out to 60 cc with normal saline to help with postoperative analgesia. The posterior flap was reapproximated to the posterior aspect of the greater trochanter using #2 Tycron interrupted sutures placed through drill holes. Several additional #2 Tycron interrupted sutures were used to reinforce this layer of closure. The iliotibial band was reapproximated using #1 Vicryl interrupted sutures before the gluteal fascia was closed using a running #1 Vicryl suture. At this point, 1 g of transexemic acid in 10 cc of normal saline was injected into the joint to help reduce postoperative bleeding. The subcutaneous tissues were closed in several layers using 2-0 Vicryl interrupted sutures before the skin was closed using staples. A sterile occlusive dressing was applied to the wound before the patient was placed into an abduction wedge pillow. The patient was then rolled back into the supine position on the hospital bed before being awakened, extubated, and returned to the recovery room in satisfactory condition after tolerating the procedure well.

## 2016-02-10 NOTE — Progress Notes (Signed)
Anticoagulation monitoring(Lovenox):  82 yo  male ordered Lovenox 40 mg Q24h  Filed Weights   02/09/16 1236  Weight: 128 lb (58.1 kg)   BMI    Lab Results  Component Value Date   CREATININE 2.43 (H) 02/10/2016   CREATININE 2.24 (H) 02/09/2016   CREATININE 1.40 (H) 05/05/2015   Estimated Creatinine Clearance: 19.9 mL/min (by C-G formula based on SCr of 2.43 mg/dL (H)). Hemoglobin & Hematocrit     Component Value Date/Time   HGB 9.5 (L) 02/10/2016 0418   HGB 10.7 (L) 07/18/2012 2034   HCT 27.8 (L) 02/10/2016 0418   HCT 31.6 (L) 07/18/2012 2034     Per Protocol for Patient with estCrcl < 30 ml/min and BMI < 40, will transition to Lovenox 30 mg Q24h.

## 2016-02-11 ENCOUNTER — Inpatient Hospital Stay: Payer: Medicare Other

## 2016-02-11 LAB — CBC WITH DIFFERENTIAL/PLATELET
Basophils Absolute: 0 10*3/uL (ref 0–0.1)
Basophils Relative: 0 %
Eosinophils Absolute: 0.1 10*3/uL (ref 0–0.7)
Eosinophils Relative: 1 %
HEMATOCRIT: 26 % — AB (ref 40.0–52.0)
HEMOGLOBIN: 8.8 g/dL — AB (ref 13.0–18.0)
LYMPHS ABS: 0.5 10*3/uL — AB (ref 1.0–3.6)
LYMPHS PCT: 5 %
MCH: 33.2 pg (ref 26.0–34.0)
MCHC: 33.8 g/dL (ref 32.0–36.0)
MCV: 98.3 fL (ref 80.0–100.0)
MONOS PCT: 8 %
Monocytes Absolute: 0.8 10*3/uL (ref 0.2–1.0)
NEUTROS ABS: 8.3 10*3/uL — AB (ref 1.4–6.5)
NEUTROS PCT: 86 %
Platelets: 127 10*3/uL — ABNORMAL LOW (ref 150–440)
RBC: 2.65 MIL/uL — ABNORMAL LOW (ref 4.40–5.90)
RDW: 13 % (ref 11.5–14.5)
WBC: 9.7 10*3/uL (ref 3.8–10.6)

## 2016-02-11 LAB — GLUCOSE, CAPILLARY
GLUCOSE-CAPILLARY: 218 mg/dL — AB (ref 65–99)
GLUCOSE-CAPILLARY: 177 mg/dL — AB (ref 65–99)
GLUCOSE-CAPILLARY: 179 mg/dL — AB (ref 65–99)
Glucose-Capillary: 88 mg/dL (ref 65–99)

## 2016-02-11 LAB — BASIC METABOLIC PANEL
ANION GAP: 7 (ref 5–15)
BUN: 36 mg/dL — ABNORMAL HIGH (ref 6–20)
CHLORIDE: 108 mmol/L (ref 101–111)
CO2: 23 mmol/L (ref 22–32)
Calcium: 7.8 mg/dL — ABNORMAL LOW (ref 8.9–10.3)
Creatinine, Ser: 2.39 mg/dL — ABNORMAL HIGH (ref 0.61–1.24)
GFR calc non Af Amer: 24 mL/min — ABNORMAL LOW (ref >60)
GFR, EST AFRICAN AMERICAN: 28 mL/min — AB (ref >60)
GLUCOSE: 217 mg/dL — AB (ref 65–99)
Potassium: 4 mmol/L (ref 3.5–5.1)
Sodium: 138 mmol/L (ref 135–145)

## 2016-02-11 MED ORDER — AMLODIPINE BESYLATE 10 MG PO TABS
10.0000 mg | ORAL_TABLET | Freq: Every day | ORAL | Status: DC
  Administered 2016-02-12 – 2016-02-13 (×2): 10 mg via ORAL
  Filled 2016-02-11 (×3): qty 1

## 2016-02-11 MED ORDER — METOPROLOL TARTRATE 50 MG PO TABS
50.0000 mg | ORAL_TABLET | Freq: Two times a day (BID) | ORAL | Status: DC
  Administered 2016-02-11 – 2016-02-13 (×5): 50 mg via ORAL
  Filled 2016-02-11 (×5): qty 1

## 2016-02-11 NOTE — Progress Notes (Addendum)
[employer] -  at [facility]   PATIENT NAME: Patrick ColaceWendell Morrison    MR#:  [id number]  DATE OF BIRTH:  01-Jul-1942  SUBJECTIVE:   C/o hip pain when he moves hip However not very good Historian  REVIEW OF SYSTEMS:    Review of Systems  Unable to perform ROS: Dementia    Tolerating Diet:yes      DRUG ALLERGIES:  No Known Allergies  VITALS:  Blood pressure (!) 146/47, pulse 82, temperature 98 F (36.7 C), temperature source Oral, resp. rate 19, height 5\' 8"  (1.727 m), weight 58.1 kg (128 lb), SpO2 99 %.  PHYSICAL EXAMINATION:   Physical Exam  Constitutional: He is well-developed, well-nourished, and in no distress. No distress.  HENT:  Head: Normocephalic.  Eyes: No scleral icterus.  Neck: Normal range of motion. Neck supple. No JVD present. No tracheal deviation present.  Cardiovascular: Normal rate, regular rhythm and normal heart sounds.  Exam reveals no gallop and no friction rub.   No murmur heard. Pulmonary/Chest: Effort normal and breath sounds normal. No respiratory distress. He has no wheezes. He has no rales. He exhibits no tenderness.  Abdominal: Soft. Bowel sounds are normal. He exhibits no distension and no mass. There is no tenderness. There is no rebound and no guarding.  Musculoskeletal: Normal range of motion. He exhibits no edema.  Neurological: He is alert.  Skin: Skin is warm. No rash noted. No erythema.  Psychiatric: Affect normal.      LABORATORY PANEL:   CBC  Recent Labs Lab 02/11/16 0640  WBC 9.7  HGB 8.8*  HCT 26.0*  PLT 127*   ------------------------------------------------------------------------------------------------------------------  Chemistries   Recent Labs Lab 02/09/16 1250  02/11/16 0640  NA 138  < > 138  K 3.7  < > 4.0  CL 105  < > 108  CO2 24  < > 23  GLUCOSE 204*  < > 217*  BUN 40*  < > 36*  CREATININE 2.24*  < > 2.39*  CALCIUM 8.4*  < > 7.8*  AST 22  --   --   ALT 14*  --   --    ALKPHOS 70  --   --   BILITOT 0.9  --   --   < > = values in this interval not displayed. ------------------------------------------------------------------------------------------------------------------  Cardiac Enzymes No results for input(s): TROPONINI in the last 168 hours. ------------------------------------------------------------------------------------------------------------------  RADIOLOGY:  Dg Chest 1 View  Result Date: 02/09/2016 CLINICAL DATA:  Preop, former smoking history EXAM: CHEST 1 VIEW COMPARISON:  None. FINDINGS: The lungs are hyperaerated suggesting an element of emphysema. No pneumonia or effusion is seen. Mediastinal and hilar contours are unremarkable. The heart is within normal limits in size. The bones are somewhat osteopenic. IMPRESSION: Hyper aeration may indicate emphysema.  No active process. Electronically Signed   By: Dwyane DeePaul  Barry M.D.   On: 02/09/2016 13:27   Dg Chest 2 View  Result Date: 02/11/2016 CLINICAL DATA:  Or for SOB Hx - Left hip unipolar hemiarthroplasty yesterday after fall at home, COPD, HTN, diabetic, former smoker quit 2005 - smoked 1 ppd x 60 yr. o EXAM: CHEST  2 VIEW COMPARISON:  02/09/2011 FINDINGS: Normal cardiac silhouette. There is chronic bronchitic markings bilaterally. No effusion, infiltrate pneumothorax. Lungs are hyperinflated. IMPRESSION: Hyperinflated lungs with chronic bronchitic markings. No acute findings. Electronically Signed   By: Genevive BiStewart  Edmunds M.D.   On: 02/11/2016 09:43   Ct Head Wo Contrast  Result Date: 02/09/2016 CLINICAL DATA:  Hematemesis. Larey SeatFell and hit back of head yesterday. History of diabetes. EXAM: CT HEAD WITHOUT CONTRAST TECHNIQUE: Contiguous axial images were obtained from the base of the skull through the vertex without intravenous contrast. COMPARISON:  CT HEAD Aug 14, 2015 and CT HEAD April 01, 2015 FINDINGS: BRAIN: Moderate to severe ventriculomegaly, predominately on the basis of global parenchymal  brain volume loss with near commensurate enlargement of cerebral sulci and cerebellar folia, unchanged. Confluent supratentorial white matter hypodensities without midline shift, mass effect or acute large vascular territory infarcts. No abnormal echoes extra-axial fluid collections. VASCULAR: Moderate calcific atherosclerosis of the carotid siphons. SKULL: No skull fracture. No significant scalp soft tissue swelling. SINUSES/ORBITS: Trace paranasal sinus mucosal thickening. Mastoid air cells are well aerated. The included ocular globes and orbital contents are non-suspicious. OTHER: None. IMPRESSION: No acute intracranial process. Stable moderate to severe global brain atrophy with possible component of normal pressure hydrocephalus. Stable severe chronic small vessel ischemic disease. Electronically Signed   By: Awilda Metroourtnay  Bloomer M.D.   On: 02/09/2016 18:44   Dg Hip Port Unilat With Pelvis 1v Left  Result Date: 02/10/2016 CLINICAL DATA:  Postoperative radiograph, status post left hip arthroplasty. Initial encounter. EXAM: DG HIP (WITH OR WITHOUT PELVIS) 1V PORT LEFT COMPARISON:  Left hip radiographs performed 02/09/2016 FINDINGS: The patient is status post left hip hemiarthroplasty, with overlying postoperative change and scattered soft tissue air. No new fractures are seen. There is no evidence of significant loosening. The right hip joint is grossly unremarkable. This is diffuse vascular calcifications are seen. The visualized bowel gas pattern is grossly unremarkable. IMPRESSION: 1. Status post left hip hemiarthroplasty.  No new fracture seen. 2. Diffuse vascular calcifications noted. Electronically Signed   By: Roanna RaiderJeffery  Chang M.D.   On: 02/10/2016 19:34   Dg Hip Unilat With Pelvis 2-3 Views Left  Result Date: 02/09/2016 CLINICAL DATA:  Acute left hip pain EXAM: DG HIP (WITH OR WITHOUT PELVIS) 2-3V LEFT COMPARISON:  04/01/2015 FINDINGS: There is an acute displaced and angulated left subcapital femoral  neck fracture. Bones are osteopenic. Degenerative changes of both hips. Iliofemoral atherosclerosis noted. Bony pelvis appears intact. IMPRESSION: Acute displaced left subcapital femoral neck fracture. Iliofemoral atherosclerosis Osteopenia Electronically Signed   By: Judie PetitM.  Shick M.D.   On: 02/09/2016 13:28     ASSESSMENT AND PLAN:   82 y.o. male with a PMH of BPH, hypertension, diabetes mellitus, dementia brought in by family because of the fall, unable to ambulate because of pain in the left leg and suffered a left subcapital femoral neck fracture  1. Left subcapital femoral neck fracture: Postoperative day #1 Management as per orthopedic surgery Patient will need skilled facility at discharge  2. Dementia:continue donepezil and watch for delirium  3.AKI on CKD stage 3 baseline in the setting of accelerated essential HTN :labs pending this am  4. Diabetes mellitus type 2, insulin dependent. Continue Lantus Sliding scale insulin   5. Essential hypertension: Blood pressure is better controlled. Will change hydralazine and clonidine to Norvasc and metoprolol  Management plans discussed with the nursing  CODE STATUS: FULL  TOTAL TIME TAKING CARE OF THIS PATIENT: 25 minutes.     POSSIBLE D/C tomorrow pending creatinine, DEPENDING ON CLINICAL CONDITION.   Liliani Bobo M.D on 02/11/2016 at 10:12 AM  Between 7am to 6pm - Pager - [phone] After 6pm go to [email or web address] - password [email or web address]  [facility]  Office  [phone]  CC: Primary care physician; Rennis HardingMEREDITH, MICHAEL W, MD  Note: This  dictation was prepared with Dragon dictation along with smaller phrase technology. Any transcriptional errors that result from this process are unintentional.

## 2016-02-11 NOTE — Evaluation (Signed)
Physical Therapy Evaluation Patient Details Name: Patrick Morrison MRN: [id number] DOB: 02-11-1934 Today's Date: 02/11/2016   History of Present Illness  Pt sufferred a L hip fracture and underwent L hemiarthroplasty without reported post-op complications. Pt is POD#1 at time of initial evaluation. Pt is intermittently confused during evaluation. History felt to be somewhat unreliable  Clinical Impression  Pt admitted with above diagnosis. Pt currently with functional limitations due to the deficits listed below (see PT Problem List).  Patient is confused throughout session. He is only oriented to self. He demonstrates pain with all the exercises and mobility. Patient required moderate assistance for bed mobility transfers and very ambulation from bed to recliner. Patient is able to complete exercises but with increased pain and considerable assistance needed for hip abduction and flexion. Patient is currently in need of skilled nursing placement at discharge in order to facilitate safe return home. He has difficulty understanding hip precautions and will need re-instruction. Pt will benefit from skilled PT services to address deficits in strength, balance, and mobility in order to return to full function at home.     Follow Up Recommendations SNF    Equipment Recommendations  None recommended by PT    Recommendations for Other Services       Precautions / Restrictions Precautions Precautions: Fall Restrictions Weight Bearing Restrictions: Yes LLE Weight Bearing: Weight bearing as tolerated      Mobility  Bed Mobility Overal bed mobility: Needs Assistance Bed Mobility: Supine to Sit     Supine to sit: Mod assist     General bed mobility comments: Heavy cues for sequencing. Assist required due to weakness and pain. Difficulty scooting toward EOB once upright in sitting  Transfers Overall transfer level: Needs assistance Equipment used: Rolling walker (2 wheeled) Transfers: Sit  to/from Stand Sit to Stand: Mod assist         General transfer comment: Pt with minimal weight shifting to LLE during transfer. Limited by pain and weakness. Heavy verbal cues for proper hand placement  Ambulation/Gait Ambulation/Gait assistance: Mod assist Ambulation Distance (Feet): 3 Feet Assistive device: Rolling walker (2 wheeled)   Gait velocity: Decreased Gait velocity interpretation: <1.8 ft/sec, indicative of risk for recurrent falls General Gait Details: Pt requires modA+1 for short shuffling steps from bed to recliner. Poor weight acceptance to LLE with heavy UE reliance. Poor balance and LLE buckling noted  Stairs            Wheelchair Mobility    Modified Rankin (Stroke Patients Only)       Balance Overall balance assessment: Needs assistance Sitting-balance support: No upper extremity supported Sitting balance-Leahy Scale: Good     Standing balance support: Bilateral upper extremity supported Standing balance-Leahy Scale: Poor Standing balance comment: Poor standing balance requiring assist to remain upright                             Pertinent Vitals/Pain Pain Assessment: Faces Faces Pain Scale: Hurts worst Pain Location: L hip Pain Intervention(s): Monitored during session;Limited activity within patient's tolerance    Home Living Family/patient expects to be discharged to:: Private residence Living Arrangements: Spouse/significant other;Children Available Help at Discharge: Family Type of Home: House Home Access: Stairs to enter Entrance Stairs-Rails: Can reach both;Right;Left Entrance Stairs-Number of Steps: 5 Home Layout: One level Home Equipment: Cane - single point;Walker - 2 wheels      Prior Function Level of Independence: Independent with assistive device(s)  Comments: Pt reports being independent and using SPC vs RW for ambulation; Pt also appearing to be poor historian but no family present to confirm  PLOF and home set-up     Hand Dominance   Dominant Hand: Right    Extremity/Trunk Assessment   Upper Extremity Assessment: Overall WFL for tasks assessed           Lower Extremity Assessment: LLE deficits/detail   LLE Deficits / Details: RLE appears grossly WFL. LLE full DF/PF. L hip flexion is weak but appears primarily limited by pain. Able to perform SAQ without assist. Denies N/T in LLE     Communication   Communication: No difficulties  Cognition Arousal/Alertness: Awake/alert Behavior During Therapy: Restless Overall Cognitive Status: No family/caregiver present to determine baseline cognitive functioning Area of Impairment: Orientation Orientation Level: Disoriented to;Place;Time;Situation             General Comments: Pt believes that his leg is hurt "below the knee" because "I got electrocuted." States that his surgery was cancelled and he didn't go through surgery to fix L hip fracture    General Comments      Exercises Total Joint Exercises Ankle Circles/Pumps: Strengthening;Both;10 reps;Supine Quad Sets: Strengthening;Both;10 reps;Supine Gluteal Sets: Strengthening;Both;10 reps;Supine Towel Squeeze: Strengthening;Both;10 reps;Supine Short Arc Quad: Strengthening;Left;10 reps;Supine Heel Slides: Strengthening;Left;10 reps;Supine Hip ABduction/ADduction: Strengthening;Left;10 reps;Supine Straight Leg Raises: Strengthening;Left;10 reps;Supine   Assessment/Plan    PT Assessment Patient needs continued PT services  PT Problem List Decreased strength;Decreased range of motion;Decreased activity tolerance;Decreased balance;Decreased cognition;Decreased mobility;Decreased knowledge of use of DME;Decreased safety awareness;Decreased knowledge of precautions;Pain          PT Treatment Interventions Gait training;DME instruction;Stair training;Functional mobility training;Therapeutic activities;Therapeutic exercise;Neuromuscular re-education;Balance  training;Cognitive remediation;Patient/family education;Manual techniques    PT Goals (Current goals can be found in the Care Plan section)  Acute Rehab PT Goals Patient Stated Goal: Return to prior level of function PT Goal Formulation: With patient Time For Goal Achievement: 02/25/16 Potential to Achieve Goals: Fair    Frequency BID   Barriers to discharge Inaccessible home environment Stairs to enter    Co-evaluation               End of Session Equipment Utilized During Treatment: Gait belt;Oxygen Activity Tolerance: Patient limited by pain Patient left: in chair;with call bell/phone within reach;with chair alarm set;with nursing/sitter in room Nurse Communication: Mobility status;Other (comment) (Put on AV1 boots after finished please)         Time: [id number] PT Time Calculation (min) (ACUTE ONLY): 29 min   Charges:   PT Evaluation $PT Eval Moderate Complexity: 1 Procedure PT Treatments $Therapeutic Exercise: 8-22 mins   PT G Codes:       Sharalyn InkJason D Alba Perillo PT, DPT   Aujanae Mccullum 02/11/2016, 10:05 AM

## 2016-02-11 NOTE — Progress Notes (Signed)
Subjective: 1 Day Post-Op Procedure(s) (LRB): ARTHROPLASTY BIPOLAR HIP (HEMIARTHROPLASTY) (Left) Patient reports pain as 6 on 0-10 scale.   Patient is well, but has had some minor complaints of shortness of breath. Care management to assist with discharge. Positive for SOB, no chest pain.  States this was going on prior to surgery. Fever: no Gastrointestinal:Negative for nausea and vomiting  Objective: Vital signs in last 24 hours: Temp:  [96.8 F (36 C)-98 F (36.7 C)] 98 F (36.7 C) (11/23 0318) Pulse Rate:  [67-85] 77 (11/23 0318) Resp:  [13-19] 19 (11/23 0318) BP: (115-157)/(45-68) 157/67 (11/23 0318) SpO2:  [93 %-100 %] 99 % (11/23 0318) FiO2 (%):  [2 %] 2 % (11/22 2302)  Intake/Output from previous day:  Intake/Output Summary (Last 24 hours) at 02/11/16 0823 Last data filed at 02/11/16 0459  Gross per 24 hour  Intake              200 ml  Output             1055 ml  Net             -855 ml    Intake/Output this shift: No intake/output data recorded.  Labs:  Recent Labs  02/09/16 1250 02/10/16 0418 02/11/16 0640  HGB 11.0* 9.5* 8.8*    Recent Labs  02/10/16 0418 02/11/16 0640  WBC 8.0 9.7  RBC 2.85* 2.65*  HCT 27.8* 26.0*  PLT 151 127*    Recent Labs  02/10/16 0418 02/10/16 1224 02/11/16 0640  NA 141  --  138  K 3.3* 3.7 4.0  CL 109  --  108  CO2 25  --  23  BUN 43*  --  36*  CREATININE 2.43*  --  2.39*  GLUCOSE 264*  --  217*  CALCIUM 7.9*  --  7.8*   No results for input(s): LABPT, INR in the last 72 hours.   EXAM General - Patient is Alert, Appropriate and Oriented Extremity - ABD soft Sensation intact distally Intact pulses distally Dorsiflexion/Plantar flexion intact Incision: dressing C/D/I No cellulitis present Dressing/Incision - clean, dry, no drainage Motor Function - intact, moving foot and toes well on exam.   Abdomen soft on exam, normal BS.  Past Medical History:  Diagnosis Date  . Abnormal echocardiogram   .  Asymptomatic bilateral carotid artery stenosis   . Bilateral knee pain   . BPH (benign prostatic hyperplasia)   . Bruit of right carotid artery   . Chronic rhinitis   . COPD (chronic obstructive pulmonary disease) (HCC)   . Diabetic macular edema (HCC)   . DM (diabetes mellitus) (HCC)   . Dysplasia of prostate   . Edema   . Hyperlipidemia   . Hypertension   . NPDR (nonproliferative diabetic retinopathy) (HCC)   . Osteoarthritis   . Personal history of other malignant neoplasm of skin   . Primary osteoarthritis of both knees   . Pseudophakia of both eyes    Assessment/Plan: 1 Day Post-Op Procedure(s) (LRB): ARTHROPLASTY BIPOLAR HIP (HEMIARTHROPLASTY) (Left) Active Problems:   Closed left hip fracture, initial encounter (HCC)  Estimated body mass index is 19.46 kg/m as calculated from the following:   Height as of this encounter: 5\' 8"  (1.727 m).   Weight as of this encounter: 58.1 kg (128 lb). Advance diet Up with therapy   Labs reviewed, Acute blood loss anemia, Hg 8.8.  Will obtain CBC tomorrow morning to determine need for transfusion. BUN 36, Cr 2.39, improved since yesterday.  Continue fluids until PO diet. Pt complaining of SOB, will obtain a CXR today. CBC and BMP ordered for tomorrow morning.  DVT Prophylaxis - Lovenox, Foot Pumps and TED hose Weight-Bearing as tolerated to left leg  J. Horris LatinoLance Billiejo Sorto, PA-C [facility] 02/11/2016, 8:23 AM

## 2016-02-11 NOTE — Progress Notes (Signed)
Pt. Has had no output on my shift. Dr. Emmit PomfretHugelmeyer notified and wants to continue monitoring pt. At this time

## 2016-02-12 ENCOUNTER — Inpatient Hospital Stay: Payer: Medicare Other

## 2016-02-12 ENCOUNTER — Encounter: Payer: Self-pay | Admitting: Surgery

## 2016-02-12 LAB — BASIC METABOLIC PANEL
ANION GAP: 8 (ref 5–15)
BUN: 42 mg/dL — ABNORMAL HIGH (ref 6–20)
CHLORIDE: 109 mmol/L (ref 101–111)
CO2: 22 mmol/L (ref 22–32)
CREATININE: 2.44 mg/dL — AB (ref 0.61–1.24)
Calcium: 8.5 mg/dL — ABNORMAL LOW (ref 8.9–10.3)
GFR calc non Af Amer: 23 mL/min — ABNORMAL LOW (ref >60)
GFR, EST AFRICAN AMERICAN: 27 mL/min — AB (ref >60)
GLUCOSE: 189 mg/dL — AB (ref 65–99)
Potassium: 4.5 mmol/L (ref 3.5–5.1)
Sodium: 139 mmol/L (ref 135–145)

## 2016-02-12 LAB — CBC
HCT: 28.6 % — ABNORMAL LOW (ref 40.0–52.0)
HEMOGLOBIN: 9.5 g/dL — AB (ref 13.0–18.0)
MCH: 33.5 pg (ref 26.0–34.0)
MCHC: 33.3 g/dL (ref 32.0–36.0)
MCV: 100.8 fL — AB (ref 80.0–100.0)
Platelets: 135 10*3/uL — ABNORMAL LOW (ref 150–440)
RBC: 2.84 MIL/uL — AB (ref 4.40–5.90)
RDW: 13.2 % (ref 11.5–14.5)
WBC: 11.9 10*3/uL — ABNORMAL HIGH (ref 3.8–10.6)

## 2016-02-12 LAB — GLUCOSE, CAPILLARY
GLUCOSE-CAPILLARY: 46 mg/dL — AB (ref 65–99)
GLUCOSE-CAPILLARY: 93 mg/dL (ref 65–99)
Glucose-Capillary: 177 mg/dL — ABNORMAL HIGH (ref 65–99)
Glucose-Capillary: 271 mg/dL — ABNORMAL HIGH (ref 65–99)
Glucose-Capillary: 116 mg/dL — ABNORMAL HIGH (ref 65–99)
Glucose-Capillary: 50 mg/dL — ABNORMAL LOW (ref 65–99)
Glucose-Capillary: 53 mg/dL — ABNORMAL LOW (ref 65–99)

## 2016-02-12 NOTE — Progress Notes (Signed)
Paged and spoke to Dr. Rexene EdisonH. Singh regarding in/out cath for patient. He okay'd the order but said if it is too painful for patient, or if unable to complete, it is alright to discontinue the order for urine test.

## 2016-02-12 NOTE — Progress Notes (Signed)
[facility] [place] [facility]   PATIENT NAME: Patrick Morrison    MR#:  [id number]  DATE OF BIRTH:  June 25, 1933  SUBJECTIVE:   No acute events overnight Creatinine still elevated Patient incontinent of urine  REVIEW OF SYSTEMS:    Review of Systems  Unable to perform ROS: Dementia    Tolerating Diet:yes      DRUG ALLERGIES:  No Known Allergies  VITALS:  Blood pressure (!) 149/60, pulse 75, temperature 97.8 F (36.6 C), temperature source Oral, resp. rate 19, height 5\' 8"  (1.727 m), weight 57.8 kg (127 lb 7.5 oz), SpO2 97 %.  PHYSICAL EXAMINATION:   Physical Exam  Constitutional: He is well-developed, well-nourished, and in no distress. No distress.  HENT:  Head: Normocephalic.  Eyes: No scleral icterus.  Neck: Normal range of motion. Neck supple. No JVD present. No tracheal deviation present.  Cardiovascular: Normal rate, regular rhythm and normal heart sounds.  Exam reveals no gallop and no friction rub.   No murmur heard. Pulmonary/Chest: Effort normal and breath sounds normal. No respiratory distress. He has no wheezes. He has no rales. He exhibits no tenderness.  Abdominal: Soft. Bowel sounds are normal. He exhibits no distension and no mass. There is no tenderness. There is no rebound and no guarding.  Musculoskeletal: He exhibits no edema.  Tenderness at hip Honeycomb dressing in place without discharge  Neurological: He is alert.  Skin: Skin is warm. No rash noted. No erythema.  Psychiatric: Affect normal.      LABORATORY PANEL:   CBC  Recent Labs Lab 02/12/16 0604  WBC 11.9*  HGB 9.5*  HCT 28.6*  PLT 135*   ------------------------------------------------------------------------------------------------------------------  Chemistries   Recent Labs Lab 02/09/16 1250  02/12/16 0604  NA 138  < > 139  K 3.7  < > 4.5  CL 105  < > 109  CO2 24  < > 22  GLUCOSE 204*  < > 189*  BUN 40*  < > 42*  CREATININE 2.24*  < > 2.44*   CALCIUM 8.4*  < > 8.5*  AST 22  --   --   ALT 14*  --   --   ALKPHOS 70  --   --   BILITOT 0.9  --   --   < > = values in this interval not displayed. ------------------------------------------------------------------------------------------------------------------  Cardiac Enzymes No results for input(s): TROPONINI in the last 168 hours. ------------------------------------------------------------------------------------------------------------------  RADIOLOGY:  Dg Chest 2 View  Result Date: 02/11/2016 CLINICAL DATA:  Or for SOB Hx - Left hip unipolar hemiarthroplasty yesterday after fall at home, COPD, HTN, diabetic, former smoker quit 2005 - smoked 1 ppd x 60 yr. o EXAM: CHEST  2 VIEW COMPARISON:  02/09/2011 FINDINGS: Normal cardiac silhouette. There is chronic bronchitic markings bilaterally. No effusion, infiltrate pneumothorax. Lungs are hyperinflated. IMPRESSION: Hyperinflated lungs with chronic bronchitic markings. No acute findings. Electronically Signed   By: Genevive BiStewart  Edmunds M.D.   On: 02/11/2016 09:43   Dg Hip Port Unilat With Pelvis 1v Left  Result Date: 02/10/2016 CLINICAL DATA:  Postoperative radiograph, status post left hip arthroplasty. Initial encounter. EXAM: DG HIP (WITH OR WITHOUT PELVIS) 1V PORT LEFT COMPARISON:  Left hip radiographs performed 02/09/2016 FINDINGS: The patient is status post left hip hemiarthroplasty, with overlying postoperative change and scattered soft tissue air. No new fractures are seen. There is no evidence of significant loosening. The right hip joint is grossly unremarkable. This is diffuse vascular calcifications are seen. The visualized  bowel gas pattern is grossly unremarkable. IMPRESSION: 1. Status post left hip hemiarthroplasty.  No new fracture seen. 2. Diffuse vascular calcifications noted. Electronically Signed   By: Roanna RaiderJeffery  Chang M.D.   On: 02/10/2016 19:34     ASSESSMENT AND PLAN:   82 y.o. male with a PMH of BPH, hypertension,  diabetes mellitus, dementia brought in by family because of the fall, unable to ambulate because of pain in the left leg and suffered a left subcapital femoral neck fracture  1. Left subcapital femoral neck fracture: Postoperative day #2 Management as per orthopedic surgery Upon discharge for the patient, should continue Lovenox 30mg  daily x 14 days for DVT prophylaxis. Staples can be removed on 02/24/16 at rehab facility, [facility] 6 weeks  2. Dementia:continue donepezil and watch for delirium  3.AKI on CKD stage 3 baseline in the setting of accelerated essential HTN/ATN: Consult nephrology  4. Diabetes mellitus type 2, insulin dependent. Continue Lantus Sliding scale insulin   5. Essential hypertension: Blood pressure is better controlled. Continue Norvasc and metoprolol  Management plans discussed with the nursing  CODE STATUS: FULL  TOTAL TIME TAKING CARE OF THIS PATIENT: 25 minutes.     POSSIBLE D/C tomorrow pending creatinine, DEPENDING ON CLINICAL CONDITION.   Makiya Jeune M.D on 02/12/2016 at 11:00 AM  Between 7am to 6pm - Pager - [phone] After 6pm go to [email or web address] - password [email or web address]  [facility]  Office  [phone]  CC: Primary care physician; Rennis HardingMEREDITH, MICHAEL W, MD  Note: This dictation was prepared with Dragon dictation along with smaller phrase technology. Any transcriptional errors that result from this process are unintentional.

## 2016-02-12 NOTE — Progress Notes (Addendum)
PT is recommending SNF. Clinical Child psychotherapistocial Worker (CSW) contacted patient's wife Corrie DandyMary and presented bed offers. [facility] chose [facility]. Tammy admissions coordinator at [facility] is aware of accepted bed offer. Plan is for patient to D/C to [facility] tomorrow to room 408. [facility]. CSW will continue to follow and assist as needed.   [facility]. Auth # [id number] RVB, next review date 02/15/16. Tammy admissions coordinator at [facility] is aware of above. Patient's wife Corrie DandyMary is aware of above.    [facility] IncorporatedBailey Seham [place], LCSW [phone]

## 2016-02-12 NOTE — Progress Notes (Addendum)
Physical Therapy Treatment Patient Details Name: Patrick Morrison MRN: [id number] DOB: November 13, 1933 Today's Date: 02/12/2016    History of Present Illness Pt sufferred a L hip fracture and underwent L hemiarthroplasty without reported post-op complications. Pt displayed some confusion on eval and continues to have issues with following instruction (especially in standing) and with general awareness.   PT Comments    Pt shows effort with requested PT acts, but seems confused and often off topic with most interaction and generally needs a lot of cuing.  He does very poorly with attempts at trying to shift weight/steps and needs a lot of assist just to safely get from bed to recliner.  Pt with very little pain at rest but with even minimal movement of L LE he seems very uncomfortable and generally struggles with exercises and mobility.    Follow Up Recommendations  SNF     Equipment Recommendations       Recommendations for Other Services       Precautions / Restrictions Precautions Precautions: Fall Restrictions LLE Weight Bearing: Weight bearing as tolerated    Mobility  Bed Mobility Overal bed mobility: Needs Assistance Bed Mobility: Supine to Sit     Supine to sit: Mod assist     General bed mobility comments: Pt makes effort to get to EOB, but ultimately needs a good amount of assist  Transfers Overall transfer level: Needs assistance Equipment used: Rolling walker (2 wheeled) Transfers: Sit to/from Stand Sit to Stand: Mod assist         General transfer comment: Pt shows excessive hesitancy to take any weight through the L LE and is eavily reliant on the walker to maintain standing even with assist  Ambulation/Gait         Gait velocity:     General Gait Details: Pt essentially unable to ambulate, he did show ability to use UEs to slightly unweight L LE while trying to swing/step L but ultimately he was completely unsafe and uneffective trying to  step   Stairs            Wheelchair Mobility    Modified Rankin (Stroke Patients Only)       Balance Overall balance assessment: Needs assistance   Sitting balance-Leahy Scale: Good       Standing balance-Leahy Scale: Poor Standing balance comment: Pt leaning heavily in walker and is unable to make even basic adjustments to active upright                    Cognition Arousal/Alertness: Awake/alert Behavior During Therapy: Restless Overall Cognitive Status: No family/caregiver present to determine baseline cognitive functioning Area of Impairment: Orientation                    Exercises  Pt performed 10-15 reps each of ankle pumps, heel slides/leg extensions, hip Ab/Ad slides, SAQ and quad sets - pt intermittently able to tolerate resistance, but generally was AAROM for most acts.  Pt did have pain with nearly all L hip movement.      General Comments        Pertinent Vitals/Pain Pain Assessment:  (unable to formally rate, indicates pain with most L LE mvt)    Home Living                      Prior Function            PT Goals (current goals can now be found in  the care plan section) Progress towards PT goals: Progressing toward goals    Frequency    BID      PT Plan Current plan remains appropriate    Co-evaluation             End of Session Equipment Utilized During Treatment: Gait belt;Oxygen Activity Tolerance: Patient limited by fatigue;Patient limited by pain Patient left: in chair;with call bell/phone within reach;with chair alarm set;with nursing/sitter in room     Time: [id number] PT Time Calculation (min) (ACUTE ONLY): 26 min  Charges:  $Therapeutic Exercise: 8-22 mins $Therapeutic Activity: 8-22 mins                    G Codes:      Malachi ProGalen R Lycia Sachdeva, DPT 02/12/2016, 1:54 PM

## 2016-02-12 NOTE — Care Management Important Message (Signed)
Important Message  Patient Details  Name: Patrick Morrison MRN: [id number] Date of Birth: July 01, 1933   Medicare Important Message Given:  Yes    Gwenette GreetBrenda S Macaela Presas, RN 02/12/2016, 9:32 AM

## 2016-02-12 NOTE — Progress Notes (Signed)
Subjective: 2 Days Post-Op Procedure(s) (LRB): ARTHROPLASTY BIPOLAR HIP (HEMIARTHROPLASTY) (Left) Patient reports pain as mild.   Patient is well, and has had no acute complaints or problems Care management to assist with discharge. Negative for SOB or chest pain this AM. Fever: no Gastrointestinal:Negative for nausea and vomiting  Objective: Vital signs in last 24 hours: Temp:  [97.6 F (36.4 C)-98 F (36.7 C)] 97.8 F (36.6 C) (11/24 0815) Pulse Rate:  [73-82] 75 (11/24 0815) Resp:  [16-28] 19 (11/24 0815) BP: (133-159)/(47-71) 149/60 (11/24 0815) SpO2:  [91 %-97 %] 97 % (11/24 0815) Weight:  [57.8 kg (127 lb 7.5 oz)] 57.8 kg (127 lb 7.5 oz) (11/24 0423)  Intake/Output from previous day:  Intake/Output Summary (Last 24 hours) at 02/12/16 0846 Last data filed at 02/11/16 1200  Gross per 24 hour  Intake              450 ml  Output                0 ml  Net              450 ml    Intake/Output this shift: No intake/output data recorded.  Labs:  Recent Labs  02/09/16 1250 02/10/16 0418 02/11/16 0640 02/12/16 0604  HGB 11.0* 9.5* 8.8* 9.5*    Recent Labs  02/11/16 0640 02/12/16 0604  WBC 9.7 11.9*  RBC 2.65* 2.84*  HCT 26.0* 28.6*  PLT 127* 135*    Recent Labs  02/11/16 0640 02/12/16 0604  NA 138 139  K 4.0 4.5  CL 108 109  CO2 23 22  BUN 36* 42*  CREATININE 2.39* 2.44*  GLUCOSE 217* 189*  CALCIUM 7.8* 8.5*   No results for input(s): LABPT, INR in the last 72 hours.   EXAM General - Patient is Alert and Appropriate Extremity - ABD soft Sensation intact distally Intact pulses distally Dorsiflexion/Plantar flexion intact Incision: dressing C/D/I No cellulitis present Dressing/Incision - clean, dry, no drainage Motor Function - intact, moving foot and toes well on exam.   Abdomen soft on exam, normal BS.  Past Medical History:  Diagnosis Date  . Abnormal echocardiogram   . Asymptomatic bilateral carotid artery stenosis   . Bilateral  knee pain   . BPH (benign prostatic hyperplasia)   . Bruit of right carotid artery   . Chronic rhinitis   . COPD (chronic obstructive pulmonary disease) (HCC)   . Diabetic macular edema (HCC)   . DM (diabetes mellitus) (HCC)   . Dysplasia of prostate   . Edema   . Hyperlipidemia   . Hypertension   . NPDR (nonproliferative diabetic retinopathy) (HCC)   . Osteoarthritis   . Personal history of other malignant neoplasm of skin   . Primary osteoarthritis of both knees   . Pseudophakia of both eyes    Assessment/Plan: 2 Days Post-Op Procedure(s) (LRB): ARTHROPLASTY BIPOLAR HIP (HEMIARTHROPLASTY) (Left) Active Problems:   Closed left hip fracture, initial encounter (HCC)  Estimated body mass index is 19.38 kg/m as calculated from the following:   Height as of this encounter: 5\' 8"  (1.727 m).   Weight as of this encounter: 57.8 kg (127 lb 7.5 oz). Advance diet Up with therapy   Labs reviewed, Acute blood loss anemia, Hg 9.5. BUN 42, Cr 2.44 this AM, encouraged oral intake and fluid intake for the patient, may need gentle IV hydration. CXR yesterday was negative for any acute process. CBC and BMP ordered for tomorrow morning.  Upon discharge for  the patient, should continue Lovenox 30mg  daily x 14 days for DVT prophylaxis. Staples can be removed on 02/24/16 at rehab facility, [facility] weeks.  DVT Prophylaxis - Lovenox, Foot Pumps and TED hose Weight-Bearing as tolerated to left leg  J. Horris LatinoLance Joleigh Mineau, PA-C [facility] 02/12/2016, 8:46 AM

## 2016-02-12 NOTE — Progress Notes (Signed)
Physical Therapy Treatment Patient Details Name: Patrick Morrison MRN: [id number] DOB: 1934/03/16 Today's Date: 02/12/2016    History of Present Illness Pt sufferred a L hip fracture and underwent L hemiarthroplasty without reported post-op complications. Pt displayed some confusion on eval and continues to have issues with following instruction and with general awareness.    PT Comments    Pt continues to have quite a bit of pain with even minimal activity.  He showed little tolerance for AAROM exercises with the L hip and was positioned in heavy leaning away from L t/o the session.  Pt clearly uncomfortable.  Pt only seemed to actually wake when his pain increased, otherwise he was trying to work with exercises, but eyes were closed and he was unable to really answer a lot of questions.  Follow Up Recommendations  SNF     Equipment Recommendations  None recommended by PT    Recommendations for Other Services       Precautions / Restrictions Precautions Precautions: Fall;Posterior Hip Restrictions LLE Weight Bearing: Weight bearing as tolerated    Mobility  Bed Mobility Overal bed mobility: Needs Assistance Bed Mobility: Supine to Sit     Supine to sit: Mod assist     General bed mobility comments: Nursing got pt back to bed with +2 max assist and c/o a lot of pain, PT did not attempt to get back up today as he was clearly in pain (and also scheduled to go done for US shortly)  Transfers Overall transfer level: Needs assistance Equipment used: Rolling walker (2 wheeled) Transfers: Sit to/from Stand Sit to Stand: Mod assist         General transfer comment: Pt shows excessive hesitancy to take any weight through the L LE and is eavily reliant on the walker to maintain standing even with assist  Ambulation/Gait         Gait velocity:     General Gait Details: Pt essentially unable to ambulate, he did show ability to use UEs to slightly unweight L LE while trying  to swing/step L but ultimately he was completely unsafe and uneffective trying to step   Stairs            Wheelchair Mobility    Modified Rankin (Stroke Patients Only)       Balance Overall balance assessment: Needs assistance   Sitting balance-Leahy Scale: Good       Standing balance-Leahy Scale: Poor Standing balance comment: Pt leaning heavily in walker and is unable to make even basic adjustments to active upright                    Cognition Arousal/Alertness: Awake/alert Behavior During Therapy: Anxious;Restless Overall Cognitive Status: No family/caregiver present to determine baseline cognitive functioning Area of Impairment: Orientation                    Exercises Total Joint Exercises Ankle Circles/Pumps: PROM;10 reps;Left Quad Sets: 10 reps;Left;Strengthening Gluteal Sets: Strengthening;Both;10 reps;Supine Short Arc Quad: AAROM;10 reps;Left Heel Slides: AROM;10 reps Hip ABduction/ADduction: AAROM;10 reps;Left    General Comments        Pertinent Vitals/Pain Pain Assessment: Faces Faces Pain Scale: Hurts whole lot Pain Location: Pain with even very minimal L LE PROM acts    Home Living                      Prior Function  PT Goals (current goals can now be found in the care plan section) Progress towards PT goals: Progressing toward goals    Frequency    BID      PT Plan Current plan remains appropriate    Co-evaluation             End of Session Equipment Utilized During Treatment: Gait belt;Oxygen Activity Tolerance: Patient limited by lethargy Patient left: with bed alarm set;with call bell/phone within reach     Time: 1443-1457 PT Time Calculation (min) (ACUTE ONLY): 14 min  Charges:  $Therapeutic Exercise: 8-22 mins $Therapeutic Activity: 8-22 mins                    G Codes:      Malachi ProGalen R Richar Dunklee, DPT 02/12/2016, 3:46 PM

## 2016-02-12 NOTE — Consult Note (Signed)
Date: 02/12/2016                  Patient Name:  Patrick Morrison  MRN: [id number]  DOB: [birth year]  Age / Sex: 82 y.o., male         PCP: Rennis Harding, MD                 Service Requesting Consult: Internal medicine/ Dr Juliene Pina                 Reason for Consult: CKD            History of Present Illness: Patient is a 82 y.o. male with medical problems of DM, Dementia, HTN, BPH, who was admitted to [facility] on 02/09/2016 for evaluation s/p fall. He was Dx with Left femoral neck fracture. He underwent Left hip hemiarthroplasty by Dr Joice Lofts on 11/22. His Creatinine is noted to be high at 2.44/GFR 23 which appears to be his baseline.  No renal imaging is available At present patient is sitting up in chair. States he is uncomfortable. Appetite is low.  Some difficulty with breathing due to COPD     Medications: Outpatient medications: Prescriptions Prior to Admission  Medication Sig Dispense Refill Last Dose  . acetaminophen (TYLENOL) 500 MG tablet Take 500 mg by mouth every 6 (six) hours as needed.   02/08/2016 at prn  . acidophilus (RISAQUAD) CAPS capsule Take by mouth daily.   02/08/2016 at am  . ADVAIR DISKUS 250-50 MCG/DOSE AEPB Take 1 puff by mouth 2 (two) times daily.  2 02/08/2016 at pm  . albuterol (PROAIR HFA) 108 (90 Base) MCG/ACT inhaler Inhale 2 puffs into the lungs every 6 (six) hours as needed.   prn at prn  . Cholecalciferol (VITAMIN D3) 2000 units capsule Take 1 capsule by mouth daily.   02/08/2016 at am  . ciprofloxacin (CIPRO) 250 MG tablet Take 250 mg by mouth 2 (two) times daily. Not taken today 02/09/2016   02/08/2016 at pm  . donepezil (ARICEPT) 5 MG tablet Take 5 mg by mouth at bedtime.   02/08/2016 at pm  . fluticasone (FLONASE) 50 MCG/ACT nasal spray Place 2 sprays into both nostrils daily.   2 02/08/2016 at am  . Insulin Detemir (LEVEMIR FLEXTOUCH [place]) Inject 10 Units into the skin daily.    02/08/2016 at am  . insulin lispro (HUMALOG) 100 UNIT/ML KiwkPen  Inject 5 Units into the skin 2 (two) times daily. AM dosing-90-150 = 3 units 151-200=5 units >201=7 units. PM dosing-90-150=5 units 151-200=7 units >201=9 units   02/08/2016 at am  . lisinopril (PRINIVIL,ZESTRIL) 40 MG tablet Take 1 tablet by mouth daily.  2 02/08/2016 at am  . loperamide (IMODIUM) 2 MG capsule Take 2 mg by mouth as needed for diarrhea or loose stools.   02/08/2016 at am  . Melatonin 3 MG TABS Take 1 tablet by mouth at bedtime.   02/08/2016 at pm  . simvastatin (ZOCOR) 20 MG tablet Take 1 tablet by mouth daily at 6 PM.   2 02/08/2016 at pm  . terazosin (HYTRIN) 10 MG capsule Take 1 capsule by mouth daily.  2 02/08/2016 at am    Current medications: Current Facility-Administered Medications  Medication Dose Route Frequency Provider Last Rate Last Dose  . 0.9 %  sodium chloride infusion   Intravenous Continuous Christena Flake, MD   Stopped at 02/11/16 1448  . acetaminophen (TYLENOL) tablet 650 mg  650 mg Oral Q6H PRN Jonny Ruiz  Fidel LevyJ Poggi, MD       Or  . acetaminophen (TYLENOL) suppository 650 mg  650 mg Rectal Q6H PRN Christena FlakeJohn J Poggi, MD      . albuterol (PROVENTIL) (2.5 MG/3ML) 0.083% nebulizer solution 2.5 mg  2.5 mg Nebulization Q6H PRN Katha HammingSnehalatha Konidena, MD      . amLODipine (NORVASC) tablet 10 mg  10 mg Oral Daily Adrian SaranSital Mody, MD   10 mg at 02/12/16 0946  . bisacodyl (DULCOLAX) EC tablet 5 mg  5 mg Oral Daily PRN Katha HammingSnehalatha Konidena, MD      . bisacodyl (DULCOLAX) suppository 10 mg  10 mg Rectal Daily PRN Christena FlakeJohn J Poggi, MD      . Chlorhexidine Gluconate Cloth 2 % PADS 6 each  6 each Topical Q0600 Katha HammingSnehalatha Konidena, MD   6 each at 02/11/16 0451  . cholecalciferol (VITAMIN D) tablet 2,000 Units  2,000 Units Oral Daily Katha HammingSnehalatha Konidena, MD   2,000 Units at 02/12/16 0946  . ciprofloxacin (CIPRO) tablet 500 mg  500 mg Oral Daily Milagros LollSrikar Sudini, MD   500 mg at 02/11/16 2047  . diphenhydrAMINE (BENADRYL) 12.5 MG/5ML elixir 12.5-25 mg  12.5-25 mg Oral Q4H PRN Christena FlakeJohn J Poggi, MD      .  docusate sodium (COLACE) capsule 100 mg  100 mg Oral BID Christena FlakeJohn J Poggi, MD   100 mg at 02/12/16 0946  . donepezil (ARICEPT) tablet 5 mg  5 mg Oral QHS Katha HammingSnehalatha Konidena, MD   5 mg at 02/11/16 2047  . enoxaparin (LOVENOX) injection 30 mg  30 mg Subcutaneous Q24H Christena FlakeJohn J Poggi, MD   30 mg at 02/12/16 0945  . ferrous sulfate tablet 325 mg  325 mg Oral TID PC Christena FlakeJohn J Poggi, MD   325 mg at 02/12/16 0946  . fluticasone (FLONASE) 50 MCG/ACT nasal spray 2 spray  2 spray Each Nare Daily Katha HammingSnehalatha Konidena, MD   2 spray at 02/12/16 0947  . hydrALAZINE (APRESOLINE) injection 10 mg  10 mg Intravenous Q6H PRN Katha HammingSnehalatha Konidena, MD      . HYDROcodone-acetaminophen (NORCO/VICODIN) 5-325 MG per tablet 1-2 tablet  1-2 tablet Oral Q4H PRN Katha HammingSnehalatha Konidena, MD   1 tablet at 02/11/16 2047  . insulin aspart (novoLOG) injection 0-9 Units  0-9 Units Subcutaneous TID WC Christena FlakeJohn J Poggi, MD   2 Units at 02/12/16 [phone]  . insulin detemir (LEVEMIR) injection 10 Units  10 Units Subcutaneous Daily Katha HammingSnehalatha Konidena, MD   Stopped at 02/11/16 1448  . loperamide (IMODIUM) capsule 2 mg  2 mg Oral PRN Katha HammingSnehalatha Konidena, MD      . magnesium hydroxide (MILK OF MAGNESIA) suspension 30 mL  30 mL Oral Daily PRN Christena FlakeJohn J Poggi, MD      . metoCLOPramide (REGLAN) tablet 5-10 mg  5-10 mg Oral Q8H PRN Christena FlakeJohn J Poggi, MD       Or  . metoCLOPramide (REGLAN) injection 5-10 mg  5-10 mg Intravenous Q8H PRN Christena FlakeJohn J Poggi, MD      . metoprolol (LOPRESSOR) tablet 50 mg  50 mg Oral BID Adrian SaranSital Mody, MD   50 mg at 02/12/16 0946  . mometasone-formoterol (DULERA) 200-5 MCG/ACT inhaler 2 puff  2 puff Inhalation BID Katha HammingSnehalatha Konidena, MD   2 puff at 02/11/16 2048  . mupirocin ointment (BACTROBAN) 2 % 1 application  1 application Nasal BID Katha HammingSnehalatha Konidena, MD   1 application at 02/12/16 0947  . ondansetron (ZOFRAN) tablet 4 mg  4 mg Oral Q6H PRN Christena FlakeJohn J Poggi, MD  Or  . ondansetron (ZOFRAN) injection 4 mg  4 mg Intravenous Q6H PRN Christena Flake, MD       . pantoprazole (PROTONIX) EC tablet 40 mg  40 mg Oral Daily Christena Flake, MD   40 mg at 02/12/16 0946  . simvastatin (ZOCOR) tablet 20 mg  20 mg Oral q1800 Katha Hamming, MD   20 mg at 02/11/16 1722  . sodium phosphate (FLEET) 7-19 GM/118ML enema 1 enema  1 enema Rectal Once PRN Christena Flake, MD      . terazosin (HYTRIN) capsule 10 mg  10 mg Oral Daily Katha Hamming, MD   10 mg at 02/12/16 0946  . traZODone (DESYREL) tablet 25 mg  25 mg Oral QHS PRN Katha Hamming, MD          Allergies: No Known Allergies    Past Medical History: Past Medical History:  Diagnosis Date  . Abnormal echocardiogram   . Asymptomatic bilateral carotid artery stenosis   . Bilateral knee pain   . BPH (benign prostatic hyperplasia)   . Bruit of right carotid artery   . Chronic rhinitis   . COPD (chronic obstructive pulmonary disease) (HCC)   . Diabetic macular edema (HCC)   . DM (diabetes mellitus) (HCC)   . Dysplasia of prostate   . Edema   . Hyperlipidemia   . Hypertension   . NPDR (nonproliferative diabetic retinopathy) (HCC)   . Osteoarthritis   . Personal history of other malignant neoplasm of skin   . Primary osteoarthritis of both knees   . Pseudophakia of both eyes      Past Surgical History: Past Surgical History:  Procedure Laterality Date  . ANKLE SURGERY    . COLECTOMY    . COLON SURGERY    . HIP ARTHROPLASTY Left 02/10/2016   Procedure: ARTHROPLASTY BIPOLAR HIP (HEMIARTHROPLASTY);  Surgeon: Christena Flake, MD;  Location: [facility] ORS;  Service: Orthopedics;  Laterality: Left;     Family History: No family history on file.   Social History: Social History   Social History  . Marital status: Married    Spouse name: N/A  . Number of children: N/A  . Years of education: N/A   Occupational History  . Not on file.   Social History Main Topics  . Smoking status: Former Smoker    Packs/day: 1.00    Years: 60.00    Types: Cigarettes    Quit date:  11/24/2003  . Smokeless tobacco: Never Used  . Alcohol use No     Comment: occasional beer  . Drug use: No  . Sexual activity: Not on file   Other Topics Concern  . Not on file   Social History Narrative  . No narrative on file     Review of Systems: Gen: no fevers, chills HEENT: decreased hearing. No c/o CV: no chest pain Resp: COPD - chronic difficulty with breathing GI: no N/V GU : low appetite MS: left hip surgery Derm:  no c/o Psych: no c/o Heme:  No c/o Neuro: no c/o.  Endocrine. No c/o  Vital Signs: Blood pressure (!) 149/60, pulse 75, temperature 97.8 F (36.6 C), temperature source Oral, resp. rate 19, height 5\' 8"  (1.727 m), weight 57.8 kg (127 lb 7.5 oz), SpO2 97 %.   Intake/Output Summary (Last 24 hours) at 02/12/16 1057 Last data filed at 02/11/16 1200  Gross per 24 hour  Intake              450  ml  Output                0 ml  Net              450 ml    Weight trends: Filed Weights   02/09/16 1236 02/12/16 0422 02/12/16 0423  Weight: 58.1 kg (128 lb) 57.8 kg (127 lb 7.5 oz) 57.8 kg (127 lb 7.5 oz)    Physical Exam: General:  NAD, sitting up in chair  HEENT anicteric  Neck:  supple  Lungs: Decreased breath sounds at bases, mild diffuse wheezing and crackles  Heart::  no rub  Abdomen: Soft, NT  Extremities:  no edema  Neurologic: Alert, able to answer questions and follow commands  Skin: warm             Lab results: Basic Metabolic Panel:  Recent Labs Lab 02/10/16 0418 02/10/16 1224 02/11/16 0640 02/12/16 0604  NA 141  --  138 139  K 3.3* 3.7 4.0 4.5  CL 109  --  108 109  CO2 25  --  23 22  GLUCOSE 264*  --  217* 189*  BUN 43*  --  36* 42*  CREATININE 2.43*  --  2.39* 2.44*  CALCIUM 7.9*  --  7.8* 8.5*    Liver Function Tests:  Recent Labs Lab 02/09/16 1250  AST 22  ALT 14*  ALKPHOS 70  BILITOT 0.9  PROT 6.2*  ALBUMIN 3.2*   No results for input(s): LIPASE, AMYLASE in the last 168 hours. No results for input(s):  AMMONIA in the last 168 hours.  CBC:  Recent Labs Lab 02/09/16 1250  02/11/16 0640 02/12/16 0604  WBC 10.7*  < > 9.7 11.9*  NEUTROABS 9.4*  --  8.3*  --   HGB 11.0*  < > 8.8* 9.5*  HCT 32.9*  < > 26.0* 28.6*  MCV 97.7  < > 98.3 100.8*  PLT 182  < > 127* 135*  < > = values in this interval not displayed.  Cardiac Enzymes: No results for input(s): CKTOTAL, TROPONINI in the last 168 hours.  BNP: Invalid input(s): POCBNP  CBG:  Recent Labs Lab 02/11/16 0730 02/11/16 1155 02/11/16 1653 02/11/16 2128 02/12/16 0812  GLUCAP 218* 177* 88 179* 177*    Microbiology: Recent Results (from the past 720 hour(s))  Surgical pcr screen     Status: Abnormal   Collection Time: 02/09/16  5:00 PM  Result Value Ref Range Status   MRSA, PCR POSITIVE (A) NEGATIVE Final    Comment: RESULT CALLED TO, READ BACK BY AND VERIFIED WITH: TRACY TOOMBS @ 2151 02/09/2016 JLJ    Staphylococcus aureus POSITIVE (A) NEGATIVE Final    Comment:        The Xpert SA Assay (FDA approved for NASAL specimens in patients over 80 years of age), is one component of a comprehensive surveillance program.  Test performance has been validated by [facility] for patients greater than or equal to 87 year old. It is not intended to diagnose infection nor to guide or monitor treatment.      Coagulation Studies: No results for input(s): LABPROT, INR in the last 72 hours.  Urinalysis: No results for input(s): COLORURINE, LABSPEC, PHURINE, GLUCOSEU, HGBUR, BILIRUBINUR, KETONESUR, PROTEINUR, UROBILINOGEN, NITRITE, LEUKOCYTESUR in the last 72 hours.  Invalid input(s): APPERANCEUR      Imaging: Dg Chest 2 View  Result Date: 02/11/2016 CLINICAL DATA:  Or for SOB Hx - Left hip unipolar hemiarthroplasty yesterday after fall at  home, COPD, HTN, diabetic, former smoker quit 2005 - smoked 1 ppd x 60 yr. o EXAM: CHEST  2 VIEW COMPARISON:  02/09/2011 FINDINGS: Normal cardiac silhouette. There is chronic  bronchitic markings bilaterally. No effusion, infiltrate pneumothorax. Lungs are hyperinflated. IMPRESSION: Hyperinflated lungs with chronic bronchitic markings. No acute findings. Electronically Signed   By: Genevive BiStewart  Edmunds M.D.   On: 02/11/2016 09:43   Dg Hip Port Unilat With Pelvis 1v Left  Result Date: 02/10/2016 CLINICAL DATA:  Postoperative radiograph, status post left hip arthroplasty. Initial encounter. EXAM: DG HIP (WITH OR WITHOUT PELVIS) 1V PORT LEFT COMPARISON:  Left hip radiographs performed 02/09/2016 FINDINGS: The patient is status post left hip hemiarthroplasty, with overlying postoperative change and scattered soft tissue air. No new fractures are seen. There is no evidence of significant loosening. The right hip joint is grossly unremarkable. This is diffuse vascular calcifications are seen. The visualized bowel gas pattern is grossly unremarkable. IMPRESSION: 1. Status post left hip hemiarthroplasty.  No new fracture seen. 2. Diffuse vascular calcifications noted. Electronically Signed   By: Roanna RaiderJeffery  Chang M.D.   On: 02/10/2016 19:34      Assessment & Plan: Pt is a 82 y.o. caucasian male with DM, Dementia, HTN, BPH, who [facility] for evaluation s/p fall. He was Dx with Left femoral neck fracture. He underwent Left hip hemiarthroplasty by Dr Joice LoftsPoggi on 11/22  1. CKD st 4 likely secondary to DM and atherosclerosis S Creatinine is at baseline - consider renal u/s  U/a from January shows glucosuria and protienuria. Will repeat studies  2. BPH - patient denies any symptoms at present  3. HTN Acceptable control  5. DM-2 w/ckd HbA1c 6.1% (care everywhere)

## 2016-02-13 LAB — BASIC METABOLIC PANEL
ANION GAP: 3 — AB (ref 5–15)
BUN: 52 mg/dL — ABNORMAL HIGH (ref 6–20)
CHLORIDE: 111 mmol/L (ref 101–111)
CO2: 26 mmol/L (ref 22–32)
Calcium: 8.4 mg/dL — ABNORMAL LOW (ref 8.9–10.3)
Creatinine, Ser: 2.69 mg/dL — ABNORMAL HIGH (ref 0.61–1.24)
GFR calc non Af Amer: 21 mL/min — ABNORMAL LOW (ref >60)
GFR, EST AFRICAN AMERICAN: 24 mL/min — AB (ref >60)
Glucose, Bld: 62 mg/dL — ABNORMAL LOW (ref 65–99)
POTASSIUM: 4.3 mmol/L (ref 3.5–5.1)
Sodium: 140 mmol/L (ref 135–145)

## 2016-02-13 LAB — CBC
HCT: 25.7 % — ABNORMAL LOW (ref 40.0–52.0)
HEMOGLOBIN: 8.6 g/dL — AB (ref 13.0–18.0)
MCH: 33.4 pg (ref 26.0–34.0)
MCHC: 33.6 g/dL (ref 32.0–36.0)
MCV: 99.6 fL (ref 80.0–100.0)
Platelets: 130 10*3/uL — ABNORMAL LOW (ref 150–440)
RBC: 2.58 MIL/uL — AB (ref 4.40–5.90)
RDW: 13.1 % (ref 11.5–14.5)
WBC: 8.4 10*3/uL (ref 3.8–10.6)

## 2016-02-13 LAB — GLUCOSE, CAPILLARY
Glucose-Capillary: 92 mg/dL (ref 65–99)
Glucose-Capillary: 94 mg/dL (ref 65–99)

## 2016-02-13 MED ORDER — ENOXAPARIN SODIUM 30 MG/0.3ML ~~LOC~~ SOLN: 30.0000 mg | SUBCUTANEOUS | 0 refills | Status: DC

## 2016-02-13 MED ORDER — FERROUS SULFATE 325 (65 FE) MG PO TABS: 325.0000 mg | ORAL_TABLET | Freq: Three times a day (TID) | ORAL | 0 refills | Status: AC

## 2016-02-13 MED ORDER — AMLODIPINE BESYLATE 10 MG PO TABS: 10.0000 mg | ORAL_TABLET | Freq: Every day | ORAL | 0 refills | Status: AC

## 2016-02-13 MED ORDER — HYDROCODONE-ACETAMINOPHEN 5-325 MG PO TABS: 1.0000 | ORAL_TABLET | ORAL | 0 refills | Status: DC | PRN

## 2016-02-13 MED ORDER — DOCUSATE SODIUM 100 MG PO CAPS: 100.0000 mg | ORAL_CAPSULE | Freq: Two times a day (BID) | ORAL | 0 refills | Status: AC

## 2016-02-13 MED ORDER — INSULIN DETEMIR 100 UNIT/ML FLEXPEN: 3.0000 [IU] | PEN_INJECTOR | Freq: Every day | SUBCUTANEOUS | 11 refills | Status: DC

## 2016-02-13 MED ORDER — METOPROLOL TARTRATE 50 MG PO TABS: 50.0000 mg | ORAL_TABLET | Freq: Two times a day (BID) | ORAL | 0 refills | Status: AC

## 2016-02-13 NOTE — Discharge Summary (Signed)
[facility] [place] [facility]   PATIENT NAME: Patrick Morrison    MR#:  [id number]  DATE OF BIRTH:  [birth year]  DATE OF ADMISSION:  02/09/2016 ADMITTING PHYSICIAN: Christena FlakeJohn J Poggi, MD  DATE OF DISCHARGE: 02/13/2016  PRIMARY CARE PHYSICIAN: Rennis HardingMEREDITH, MICHAEL W, MD    ADMISSION DIAGNOSIS:  Left hip pain [M25.552] Closed fracture of left hip, initial encounter (HCC) [S72.002A]  DISCHARGE DIAGNOSIS:  Active Problems:   Closed left hip fracture, initial encounter (HCC)   SECONDARY DIAGNOSIS:   Past Medical History:  Diagnosis Date  . Abnormal echocardiogram   . Asymptomatic bilateral carotid artery stenosis   . Bilateral knee pain   . BPH (benign prostatic hyperplasia)   . Bruit of right carotid artery   . Chronic rhinitis   . COPD (chronic obstructive pulmonary disease) (HCC)   . Diabetic macular edema (HCC)   . DM (diabetes mellitus) (HCC)   . Dysplasia of prostate   . Edema   . Hyperlipidemia   . Hypertension   . NPDR (nonproliferative diabetic retinopathy) (HCC)   . Osteoarthritis   . Personal history of other malignant neoplasm of skin   . Primary osteoarthritis of both knees   . Pseudophakia of both eyes     HOSPITAL COURSE:    82 y.o.malewith a PMH of BPH, hypertension, diabetes mellitus, dementia brought in by family because of the fall, unable to ambulate because of pain in the left leg and suffered a left subcapital femoral neck fracture  1. Left subcapital femoral neck fracture: Postoperative day #3 He will continue Lovenox 30mg  daily x 14 days for DVT prophylaxis. Staples can be removed on 02/24/16 at rehab facility, [facility] 6 weeks  2. Dementia:continue donepezil and watch for delirium  3. CKD stage 4: he was evaluated by Nephrology and it appears that Mr. Wallington's baseline creatinine is around 2.4. He did not have acute kidney injury.  4. Diabetes mellitus type 2, insulin dependent: He needs low dose  Levemir until his apeitite increases. He should be evaluated by Dietitian when he arrives to SNF. He needs bedtime snack to avoid hypoglycemia.  Continue Levemir and ADA diet.    5. Essential hypertension: Blood pressure is better controlled. Continue Norvasc and metoprolol  DISCHARGE CONDITIONS AND DIET:   Stable  Diabetic diet  CONSULTS OBTAINED:  Treatment Team:  Mosetta PigeonHarmeet Singh, MD  DRUG ALLERGIES:  No Known Allergies  DISCHARGE MEDICATIONS:   Current Discharge Medication List    START taking these medications   Details  amLODipine (NORVASC) 10 MG tablet Take 1 tablet (10 mg total) by mouth daily. Qty: 30 tablet, Refills: 0    docusate sodium (Morrison) 100 MG capsule Take 1 capsule (100 mg total) by mouth 2 (two) times daily. Qty: 10 capsule, Refills: 0    enoxaparin (LOVENOX) 30 MG/0.3ML injection Inject 0.3 mLs (30 mg total) into the skin daily. Qty: 4.2 mL, Refills: 0    ferrous sulfate 325 (65 FE) MG tablet Take 1 tablet (325 mg total) by mouth 3 (three) times daily after meals. Qty: 30 tablet, Refills: 0    HYDROcodone-acetaminophen (NORCO/VICODIN) 5-325 MG tablet Take 1-2 tablets by mouth every 4 (four) hours as needed for moderate pain. Qty: 30 tablet, Refills: 0    metoprolol (LOPRESSOR) 50 MG tablet Take 1 tablet (50 mg total) by mouth 2 (two) times daily. Qty: 60 tablet, Refills: 0      CONTINUE these medications which have CHANGED  Details  Insulin Detemir (LEVEMIR FLEXTOUCH) 100 UNIT/ML Pen Inject 3 Units into the skin daily. Qty: 15 mL, Refills: 11      CONTINUE these medications which have NOT CHANGED   Details  acetaminophen (TYLENOL) 500 MG tablet Take 500 mg by mouth every 6 (six) hours as needed.    acidophilus (RISAQUAD) CAPS capsule Take by mouth daily.    ADVAIR DISKUS 250-50 MCG/DOSE AEPB Take 1 puff by mouth 2 (two) times daily. Refills: 2    albuterol (PROAIR HFA) 108 (90 Base) MCG/ACT inhaler Inhale 2 puffs into the lungs every  6 (six) hours as needed.    Cholecalciferol (VITAMIN D3) 2000 units capsule Take 1 capsule by mouth daily.    donepezil (ARICEPT) 5 MG tablet Take 5 mg by mouth at bedtime.    fluticasone (FLONASE) 50 MCG/ACT nasal spray Place 2 sprays into both nostrils daily.  Refills: 2    loperamide (IMODIUM) 2 MG capsule Take 2 mg by mouth as needed for diarrhea or loose stools.    Melatonin 3 MG TABS Take 1 tablet by mouth at bedtime.    simvastatin (ZOCOR) 20 MG tablet Take 1 tablet by mouth daily at 6 PM.  Refills: 2    terazosin (HYTRIN) 10 MG capsule Take 1 capsule by mouth daily. Refills: 2      STOP taking these medications     ciprofloxacin (CIPRO) 250 MG tablet      insulin lispro (HUMALOG) 100 UNIT/ML KiwkPen      lisinopril (PRINIVIL,ZESTRIL) 40 MG tablet               Today   CHIEF COMPLAINT:   Patient with hypoglycemia this am    VITAL SIGNS:  Blood pressure 136/60, pulse 62, temperature 97.6 F (36.4 C), temperature source Oral, resp. rate 18, height 5\' 8"  (1.727 m), weight 58.2 kg (128 lb 3.2 oz), SpO2 97 %.   REVIEW OF SYSTEMS:  Review of Systems  Constitutional: Negative.  Negative for chills, fever and malaise/fatigue.  HENT: Negative.  Negative for ear discharge, ear pain, hearing loss, nosebleeds and sore throat.   Eyes: Negative.  Negative for blurred vision and pain.  Respiratory: Negative.  Negative for cough, hemoptysis, shortness of breath and wheezing.   Cardiovascular: Negative.  Negative for chest pain, palpitations and leg swelling.  Gastrointestinal: Negative.  Negative for abdominal pain, blood in stool, diarrhea, nausea and vomiting.  Genitourinary: Negative.  Negative for dysuria.  Musculoskeletal: Positive for joint pain. Negative for back pain.  Skin: Negative.   Neurological: Negative for dizziness, tremors, speech change, focal weakness, seizures and headaches.  Endo/Heme/Allergies: Negative.  Does not bruise/bleed easily.   Psychiatric/Behavioral: Positive for memory loss. Negative for depression, hallucinations and suicidal ideas.     PHYSICAL EXAMINATION:  GENERAL:  82 y.o.-year-old patient lying in the bed with no acute distress. Pursed breathing which is baseline for patient Thin  NECK:  Supple, no jugular venous distention. No thyroid enlargement, no tenderness.  LUNGS: Normal breath sounds bilaterally, no wheezing, rales,rhonchi  No use of accessory muscles of respiration.  CARDIOVASCULAR: S1, S2 normal. No murmurs, rubs, or gallops.  ABDOMEN: Soft, non-tender, non-distended. Bowel sounds present. No organomegaly or mass.  EXTREMITIES: No pedal edema, cyanosis, or clubbing.  Left Hip without edema PSYCHIATRIC: The patient is alert and oriented x name  SKIN: No obvious rash, lesion, or ulcer.   DATA REVIEW:   CBC  Recent Labs Lab 02/13/16 0459  WBC 8.4  HGB 8.6*  HCT 25.7*  PLT 130*    Chemistries   Recent Labs Lab 02/09/16 1250  02/13/16 0459  NA 138  < > 140  K 3.7  < > 4.3  CL 105  < > 111  CO2 24  < > 26  GLUCOSE 204*  < > 62*  BUN 40*  < > 52*  CREATININE 2.24*  < > 2.69*  CALCIUM 8.4*  < > 8.4*  AST 22  --   --   ALT 14*  --   --   ALKPHOS 70  --   --   BILITOT 0.9  --   --   < > = values in this interval not displayed.  Cardiac Enzymes No results for input(s): TROPONINI in the last 168 hours.  Microbiology Results  @[email or web address] @  RADIOLOGY:  Dg Chest 2 View  Result Date: 02/11/2016 CLINICAL DATA:  Or for SOB Hx - Left hip unipolar hemiarthroplasty yesterday after fall at home, COPD, HTN, diabetic, former smoker quit 2005 - smoked 1 ppd x 60 yr. o EXAM: CHEST  2 VIEW COMPARISON:  02/09/2011 FINDINGS: Normal cardiac silhouette. There is chronic bronchitic markings bilaterally. No effusion, infiltrate pneumothorax. Lungs are hyperinflated. IMPRESSION: Hyperinflated lungs with chronic bronchitic markings. No acute findings. Electronically Signed   By: Genevive BiStewart  Edmunds  M.D.   On: 02/11/2016 09:43   [place] Renal  Result Date: 02/12/2016 CLINICAL DATA:  Renal failure EXAM: RENAL / URINARY TRACT ULTRASOUND COMPLETE COMPARISON:  None. FINDINGS: Right Kidney: Length: 10.3 cm. There is diffuse increased echogenicity suspicious for medical renal disease. Multiple cysts are noted. Cyst in midpole measures 1.7 x 2.5 x 1.7 cm. Cyst in upper pole measures 3.2 by 2.1 cm. Left Kidney: Length: 11.6 cm. Echogenicity within normal limits. No hydronephrosis. A cyst in lower pole measures 2.6 x 2.1 cm. Bladder: There is enlarged prostate gland with polypoid indentation of urinary bladder base. Correlation with urology exam is recommended. IMPRESSION: 1. Bilateral renal cysts. No hydronephrosis. There is increased echogenicity of the right kidney suspicious for medical renal disease. 2. Enlarged prostate gland with polypoid indentation of urinary bladder base. Correlation with urology exam is recommended. Electronically Signed   By: Natasha MeadLiviu  Pop M.D.   On: 02/12/2016 16:21      Management plans discussed with the patient and he  is in agreement. Stable for discharge SNF  Patient should follow up with pcp and ortho  CODE STATUS:     Code Status Orders        Start     Ordered   02/09/16 1524  Full code  Continuous     02/09/16 1525    Code Status History    Date Active Date Inactive Code Status Order ID Comments User Context   This patient has a current code status but no historical code status.      TOTAL TIME TAKING CARE OF THIS PATIENT: 38 minutes.    Note: This dictation was prepared with Dragon dictation along with smaller phrase technology. Any transcriptional errors that result from this process are unintentional.  Elna Radovich M.D on 02/13/2016 at 7:55 AM  Between 7am to 6pm - Pager - [phone] After 6pm go to [email or web address] - Social research officer, governmentpassword [email or web address] [facility]  [facility]  Office  [phone]  CC: Primary care physician; Rennis HardingMEREDITH, MICHAEL W,  MD

## 2016-02-13 NOTE — Progress Notes (Signed)
Subjective: 3 Days Post-Op Procedure(s) (LRB): ARTHROPLASTY BIPOLAR HIP (HEMIARTHROPLASTY) (Left) Patient reports pain as mild.   Patient is well, and has had no acute complaints or problems Care management to assist with discharge. Negative for SOB or chest pain this AM. Fever: no Gastrointestinal:Negative for nausea and vomiting  Objective: Vital signs in last 24 hours: Temp:  [97.6 F (36.4 C)-98.9 F (37.2 C)] 97.6 F (36.4 C) (11/25 0714) Pulse Rate:  [62-93] 62 (11/25 0714) Resp:  [18-21] 18 (11/25 0714) BP: (132-160)/(58-73) 136/60 (11/25 0714) SpO2:  [95 %-97 %] 97 % (11/25 0714) Weight:  [58.2 kg (128 lb 3.2 oz)] 58.2 kg (128 lb 3.2 oz) (11/25 0509)  Intake/Output from previous day:  Intake/Output Summary (Last 24 hours) at 02/13/16 0845 Last data filed at 02/13/16 0530  Gross per 24 hour  Intake              240 ml  Output                0 ml  Net              240 ml    Intake/Output this shift: No intake/output data recorded.  Labs:  Recent Labs  02/11/16 0640 02/12/16 0604 02/13/16 0459  HGB 8.8* 9.5* 8.6*    Recent Labs  02/12/16 0604 02/13/16 0459  WBC 11.9* 8.4  RBC 2.84* 2.58*  HCT 28.6* 25.7*  PLT 135* 130*    Recent Labs  02/12/16 0604 02/13/16 0459  NA 139 140  K 4.5 4.3  CL 109 111  CO2 22 26  BUN 42* 52*  CREATININE 2.44* 2.69*  GLUCOSE 189* 62*  CALCIUM 8.5* 8.4*   No results for input(s): LABPT, INR in the last 72 hours.   EXAM General - Patient is Alert and Confused Extremity - ABD soft Sensation intact distally Intact pulses distally Dorsiflexion/Plantar flexion intact Incision: dressing C/D/I No cellulitis present Dressing/Incision - clean, dry, no drainage Motor Function - intact, moving foot and toes well on exam.   Abdomen soft on exam, normal BS.  Past Medical History:  Diagnosis Date  . Abnormal echocardiogram   . Asymptomatic bilateral carotid artery stenosis   . Bilateral knee pain   . BPH  (benign prostatic hyperplasia)   . Bruit of right carotid artery   . Chronic rhinitis   . COPD (chronic obstructive pulmonary disease) (HCC)   . Diabetic macular edema (HCC)   . DM (diabetes mellitus) (HCC)   . Dysplasia of prostate   . Edema   . Hyperlipidemia   . Hypertension   . NPDR (nonproliferative diabetic retinopathy) (HCC)   . Osteoarthritis   . Personal history of other malignant neoplasm of skin   . Primary osteoarthritis of both knees   . Pseudophakia of both eyes    Assessment/Plan: 3 Days Post-Op Procedure(s) (LRB): ARTHROPLASTY BIPOLAR HIP (HEMIARTHROPLASTY) (Left) Active Problems:   Closed left hip fracture, initial encounter (HCC)  Estimated body mass index is 19.49 kg/m as calculated from the following:   Height as of this encounter: 5\' 8"  (1.727 m).   Weight as of this encounter: 58.2 kg (128 lb 3.2 oz). Advance diet Up with therapy   Labs reviewed, Acute blood loss anemia, Hg 8.6. BUN 52, Cr 2.69 this AM, nephrology consulted, hovering around baseline numbers. CXR was negative for any acute process. Patient has denied having a bowel movement, will work on BM prior to discharge.  Upon discharge for the patient, should continue Lovenox  30mg  daily x 14 days for DVT prophylaxis. Staples can be removed on 02/24/16 at rehab facility, follow-up with [facility] in 6 weeks.  DVT Prophylaxis - Lovenox, Foot Pumps and TED hose Weight-Bearing as tolerated to left leg  J. Horris LatinoLance Jeovanni Heuring, PA-C [facility] 02/13/2016, 8:45 AM

## 2016-02-13 NOTE — Progress Notes (Signed)
EMS here to transport [facility] to [facility] 

## 2016-02-13 NOTE — Progress Notes (Signed)
Called report [place] [facility]. Room 400. IV removed and patient ready for transport. Belongings packed. EMS called.

## 2016-02-13 NOTE — Progress Notes (Signed)
Physical Therapy Treatment Patient Details Name: Patrick Morrison MRN: [id number] DOB: [birth year] Today's Date: 02/13/2016    History of Present Illness Pt sufferred a L hip fracture and underwent L hemiarthroplasty without reported post-op complications. Pt displayed some confusion on eval and continues to have issues with following instruction and with general awareness.    PT Comments    Pt in bed ready for session.  Discussed with nursing prior to session.  Pt awaiting transport to SNF and not ready for pain medication so exercises only this session as described below.  Pt continues to lean significantly to right in bed.  Allows readjustment but goes right back to right lean.  Pt states he is comfortable.    Follow Up Recommendations  SNF     Equipment Recommendations  None recommended by PT    Recommendations for Other Services       Precautions / Restrictions Precautions Precautions: Fall;Posterior Hip Restrictions Weight Bearing Restrictions: Yes LLE Weight Bearing: Weight bearing as tolerated    Mobility  Bed Mobility               General bed mobility comments:  (Discussed with primary nurse.  Deferred this am.  )  Transfers                    Ambulation/Gait                 Stairs            Wheelchair Mobility    Modified Rankin (Stroke Patients Only)       Balance                                    Cognition Arousal/Alertness: Awake/alert Behavior During Therapy: WFL for tasks assessed/performed Overall Cognitive Status: No family/caregiver present to determine baseline cognitive functioning                      Exercises Total Joint Exercises Ankle Circles/Pumps: PROM;Both;20 reps;Supine Towel Squeeze: Strengthening;Both;10 reps;Supine Short Arc Quad: AAROM;Left;20 reps;Supine Heel Slides: AAROM;Both;20 reps;Supine Hip ABduction/ADduction: AAROM;Both;20 reps;Supine Straight Leg Raises:  AAROM;Both;20 reps;Supine    General Comments        Pertinent Vitals/Pain Pain Assessment: Faces Faces Pain Scale: Hurts even more Pain Location: Left hip Pain Descriptors / Indicators:  (unable to describe) Pain Intervention(s): Monitored during session;Limited activity within patient's tolerance    Home Living                      Prior Function            PT Goals (current goals can now be found in the care plan section)      Frequency    BID      PT Plan Current plan remains appropriate    Co-evaluation             End of Session Equipment Utilized During Treatment: Oxygen Activity Tolerance: Patient limited by pain Patient left: with bed alarm set;with call bell/phone within reach     Time: 0915-0941 PT Time Calculation (min) (ACUTE ONLY): 26 min  Charges:  $Therapeutic Exercise: 23-37 mins                    G Codes:      Danielle DessSarah Matson Welch 02/13/2016, 10:09 AM

## 2016-02-13 NOTE — Progress Notes (Signed)
Hypoglycemic Event  CBG: 50  Treatment: 15 GM carbohydrate snack  Symptoms: None  Follow-up CBG: Time: 2145 CBG Result: 93  Possible Reasons for Event: Inadequate meal intake  Comments/MD notified: Hypolgycemic protocol implemented    Neill Jurewicz, Pincus SanesAnessa Mae C, RN

## 2016-02-13 NOTE — Clinical Social Work Note (Signed)
Patient to dc to [facility] via non-emergent EMS due to dementia and inability to sit in a WC due to recent hip repair. The patient's wife Patrick Morrison and the facility are aware. CSW will con't to follow pending any additional dc needs.  [name], MSW, LCSW-A [phone]

## 2016-02-15 LAB — SURGICAL PATHOLOGY

## 2016-04-02 ENCOUNTER — Emergency Department: Payer: Medicare Other

## 2016-04-02 ENCOUNTER — Encounter: Payer: Self-pay | Admitting: Emergency Medicine

## 2016-04-02 ENCOUNTER — Emergency Department
Admission: EM | Admit: 2016-04-02 | Discharge: 2016-04-02 | Disposition: A | Discharge status: Home / Self Care | Payer: Medicare Other | Attending: Emergency Medicine | Admitting: Emergency Medicine

## 2016-04-02 DIAGNOSIS — Z96642 Presence of left artificial hip joint: Secondary | ICD-10-CM | POA: Insufficient documentation

## 2016-04-02 DIAGNOSIS — Z87891 Personal history of nicotine dependence: Secondary | ICD-10-CM | POA: Insufficient documentation

## 2016-04-02 DIAGNOSIS — Y939 Activity, unspecified: Secondary | ICD-10-CM | POA: Diagnosis not present

## 2016-04-02 DIAGNOSIS — Y9289 Other specified places as the place of occurrence of the external cause: Secondary | ICD-10-CM | POA: Diagnosis not present

## 2016-04-02 DIAGNOSIS — W19XXXA Unspecified fall, initial encounter: Secondary | ICD-10-CM | POA: Insufficient documentation

## 2016-04-02 DIAGNOSIS — Z7901 Long term (current) use of anticoagulants: Secondary | ICD-10-CM | POA: Diagnosis not present

## 2016-04-02 DIAGNOSIS — I10 Essential (primary) hypertension: Secondary | ICD-10-CM | POA: Insufficient documentation

## 2016-04-02 DIAGNOSIS — Z9889 Other specified postprocedural states: Secondary | ICD-10-CM

## 2016-04-02 DIAGNOSIS — Z79899 Other long term (current) drug therapy: Secondary | ICD-10-CM | POA: Insufficient documentation

## 2016-04-02 DIAGNOSIS — S73005A Unspecified dislocation of left hip, initial encounter: Secondary | ICD-10-CM | POA: Insufficient documentation

## 2016-04-02 DIAGNOSIS — Z794 Long term (current) use of insulin: Secondary | ICD-10-CM | POA: Diagnosis not present

## 2016-04-02 DIAGNOSIS — Y999 Unspecified external cause status: Secondary | ICD-10-CM | POA: Insufficient documentation

## 2016-04-02 DIAGNOSIS — E119 Type 2 diabetes mellitus without complications: Secondary | ICD-10-CM | POA: Insufficient documentation

## 2016-04-02 DIAGNOSIS — J449 Chronic obstructive pulmonary disease, unspecified: Secondary | ICD-10-CM | POA: Diagnosis not present

## 2016-04-02 DIAGNOSIS — S79912A Unspecified injury of left hip, initial encounter: Secondary | ICD-10-CM | POA: Diagnosis present

## 2016-04-02 LAB — COMPREHENSIVE METABOLIC PANEL
ALBUMIN: 2.8 g/dL — AB (ref 3.5–5.0)
ALK PHOS: 79 U/L (ref 38–126)
ALT: 11 U/L — ABNORMAL LOW (ref 17–63)
AST: 19 U/L (ref 15–41)
Anion gap: 8 (ref 5–15)
BILIRUBIN TOTAL: 0.3 mg/dL (ref 0.3–1.2)
BUN: 43 mg/dL — AB (ref 6–20)
CO2: 27 mmol/L (ref 22–32)
Calcium: 8.2 mg/dL — ABNORMAL LOW (ref 8.9–10.3)
Chloride: 103 mmol/L (ref 101–111)
Creatinine, Ser: 2.2 mg/dL — ABNORMAL HIGH (ref 0.61–1.24)
GFR calc Af Amer: 31 mL/min — ABNORMAL LOW (ref >60)
GFR calc non Af Amer: 27 mL/min — ABNORMAL LOW (ref >60)
GLUCOSE: 174 mg/dL — AB (ref 65–99)
POTASSIUM: 3.8 mmol/L (ref 3.5–5.1)
Sodium: 138 mmol/L (ref 135–145)
TOTAL PROTEIN: 6.1 g/dL — AB (ref 6.5–8.1)

## 2016-04-02 LAB — CBC WITH DIFFERENTIAL/PLATELET
BASOS PCT: 2 %
Basophils Absolute: 0.1 10*3/uL (ref 0–0.1)
Eosinophils Absolute: 0.4 10*3/uL (ref 0–0.7)
Eosinophils Relative: 7 %
HEMATOCRIT: 29 % — AB (ref 40.0–52.0)
HEMOGLOBIN: 9.9 g/dL — AB (ref 13.0–18.0)
Lymphocytes Relative: 12 %
Lymphs Abs: 0.7 10*3/uL — ABNORMAL LOW (ref 1.0–3.6)
MCH: 33.1 pg (ref 26.0–34.0)
MCHC: 34.1 g/dL (ref 32.0–36.0)
MCV: 97 fL (ref 80.0–100.0)
Monocytes Absolute: 0.5 10*3/uL (ref 0.2–1.0)
Monocytes Relative: 9 %
NEUTROS ABS: 4.1 10*3/uL (ref 1.4–6.5)
NEUTROS PCT: 70 %
Platelets: 227 10*3/uL (ref 150–440)
RBC: 2.99 MIL/uL — ABNORMAL LOW (ref 4.40–5.90)
RDW: 13.7 % (ref 11.5–14.5)
WBC: 5.7 10*3/uL (ref 3.8–10.6)

## 2016-04-02 LAB — TROPONIN I: Troponin I: <0.03 ng/mL

## 2016-04-02 LAB — CK: Total CK: 102 U/L (ref 49–397)

## 2016-04-02 MED ORDER — OXYCODONE-ACETAMINOPHEN 5-325 MG PO TABS: 2.0000 | ORAL_TABLET | Freq: Four times a day (QID) | ORAL | 0 refills | Status: DC | PRN

## 2016-04-02 MED ORDER — ONDANSETRON HCL 4 MG/2ML IJ SOLN
INTRAMUSCULAR | Status: AC
  Administered 2016-04-02: 4 mg via INTRAVENOUS
  Filled 2016-04-02: qty 2

## 2016-04-02 MED ORDER — PROPOFOL 10 MG/ML IV BOLUS: 0.5000 mg/kg | Freq: Once | INTRAVENOUS | Status: DC

## 2016-04-02 MED ORDER — PROPOFOL 10 MG/ML IV BOLUS
INTRAVENOUS | Status: DC | PRN
  Administered 2016-04-02 (×2): 30 mg via INTRAVENOUS

## 2016-04-02 MED ORDER — ONDANSETRON HCL 4 MG/2ML IJ SOLN
4.0000 mg | Freq: Once | INTRAMUSCULAR | Status: AC
  Administered 2016-04-02: 4 mg via INTRAVENOUS

## 2016-04-02 MED ORDER — MORPHINE SULFATE (PF) 4 MG/ML IV SOLN
4.0000 mg | Freq: Once | INTRAVENOUS | Status: AC
  Administered 2016-04-02: 4 mg via INTRAVENOUS

## 2016-04-02 MED ORDER — MORPHINE SULFATE (PF) 4 MG/ML IV SOLN
INTRAVENOUS | Status: AC
  Administered 2016-04-02: 4 mg via INTRAVENOUS
  Filled 2016-04-02: qty 1

## 2016-04-02 MED ORDER — MORPHINE SULFATE (PF) 4 MG/ML IV SOLN
4.0000 mg | Freq: Once | INTRAVENOUS | Status: AC
  Administered 2016-04-02: 4 mg via INTRAVENOUS
  Filled 2016-04-02: qty 1

## 2016-04-02 NOTE — ED Notes (Signed)
Called pharmacy to confirm propofol order.  Med shaded out in pixis.

## 2016-04-02 NOTE — ED Notes (Signed)
[facility] Back by EMS.

## 2016-04-02 NOTE — ED Provider Notes (Signed)
[facility] Emergency Department Provider Note        Time seen: ----------------------------------------- 6:13 PM on 04/02/2016 -----------------------------------------    I have reviewed the triage vital signs and the nursing notes.   HISTORY  Chief Complaint Fall ([facility]. [facility]    HPI Patrick Morrison is a 83 y.o. male who presents to the ER after having had a fall at assisted living. Patient reports pain to his left hip. Patient reportedly had a history of left hip fracture with left hip replacement. He also sustained a skin tear to left forearm. Patient's complaining of severe left hip pain with range of motion, he denies any other complaints.   Past Medical History:  Diagnosis Date  . Abnormal echocardiogram   . Asymptomatic bilateral carotid artery stenosis   . Bilateral knee pain   . BPH (benign prostatic hyperplasia)   . Bruit of right carotid artery   . Chronic rhinitis   . COPD (chronic obstructive pulmonary disease) (HCC)   . Diabetic macular edema (HCC)   . DM (diabetes mellitus) (HCC)   . Dysplasia of prostate   . Edema   . Hyperlipidemia   . Hypertension   . NPDR (nonproliferative diabetic retinopathy) (HCC)   . Osteoarthritis   . Personal history of other malignant neoplasm of skin   . Primary osteoarthritis of both knees   . Pseudophakia of both eyes     Patient Active Problem List   Diagnosis Date Noted  . Closed left hip fracture, initial encounter (HCC) 02/09/2016    Past Surgical History:  Procedure Laterality Date  . ANKLE SURGERY    . COLECTOMY    . COLON SURGERY    . HIP ARTHROPLASTY Left 02/10/2016   Procedure: ARTHROPLASTY BIPOLAR HIP (HEMIARTHROPLASTY);  Surgeon: Christena FlakeJohn J Poggi, MD;  Location: [facility] ORS;  Service: Orthopedics;  Laterality: Left;    Allergies Patient has no known allergies.  Social History Social History  Substance Use Topics  . Smoking status: Former Smoker     Packs/day: 1.00    Years: 60.00    Types: Cigarettes    Quit date: 11/24/2003  . Smokeless tobacco: Never Used  . Alcohol use No     Comment: occasional beer    Review of Systems Constitutional: Negative for fever. Cardiovascular: Negative for chest pain. Respiratory: Negative for shortness of breath. Gastrointestinal: Negative for abdominal pain, vomiting and diarrhea. Musculoskeletal: Positive for left hip pain Skin: Positive for left forearm skin tear Neurological: Negative for headaches, focal weakness or numbness.  10-point ROS otherwise negative.  ____________________________________________   PHYSICAL EXAM:  VITAL SIGNS: ED Triage Vitals  Enc Vitals Group     BP --      Pulse Rate 04/02/16 1752 63     Resp 04/02/16 1752 18     Temp 04/02/16 1752 97.5 F (36.4 C)     Temp Source 04/02/16 1752 Oral     SpO2 04/02/16 1752 96 %     Weight 04/02/16 1755 135 lb (61.2 kg)     Height 04/02/16 1755 5\' 7"  (1.702 m)     Head Circumference --      [facility] Flow --      Pain Score 04/02/16 1755 8     Pain Loc --      Pain Edu? --      Excl. in GC? --     Constitutional: Alert, Mild distress Eyes: Conjunctivae are normal. Normal extraocular movements. ENT  Head: Normocephalic and atraumatic.   Nose: No congestion/rhinnorhea.   Mouth/Throat: Mucous membranes are moist.   Neck: No stridor. Cardiovascular: Normal rate, regular rhythm. No murmurs, rubs, or gallops. Respiratory: Tachypnea with clear breath sounds Gastrointestinal: Soft and nontender. Normal bowel sounds Musculoskeletal: Left hip tenderness, pain with range of motion of the left hip. Left hip is shortened and internally rotated Neurologic:  Normal speech and language. No gross focal neurologic deficits are appreciated.  Skin:  Skin is warm, dry and intact. No rash noted. Psychiatric: Mood and affect are normal. Speech and behavior are normal.  ____________________________________________  ED  COURSE:  Pertinent labs & imaging results that were available during my care of the patient were reviewed by me and considered in my medical decision making (see chart for details). Clinical Course   Patient presents to ER after a fall. We will assess with labs and imaging.  .Sedation Date/Time: 04/02/2016 7:32 PM Performed by: Emily Filbert Authorized by: Daryel November E   Consent:    Consent obtained:  Verbal Indications:    Procedure performed:  Dislocation reduction Pre-sedation assessment:    ASA classification: class 3 - patient with severe systemic disease     Neck mobility: normal     History of difficult intubation: no   Immediate pre-procedure details:    Reviewed: vital signs   Procedure details (see MAR for exact dosages):    Preoxygenation:  Nasal cannula   Sedation:  Propofol   Intra-procedure monitoring:  Blood pressure monitoring, cardiac monitor and continuous pulse oximetry   Intra-procedure events: none   Post-procedure details:    Attendance: Constant attendance by certified staff until patient recovered     Patient tolerance:  Tolerated well, no immediate complications  Reduction of dislocation Date/Time: 8:23 PM Performed by: Emily Filbert Authorized by: Daryel November E Consent: Verbal consent obtained. Risks and benefits: risks, benefits and alternatives were discussed Consent given by: patient Required items: required blood products, implants, devices, and special equipment available Time out: Immediately prior to procedure a "time out" was called to verify the correct patient, procedure, equipment, support staff and site/side marked as required.  Patient sedated: Yes, see above  Vitals: Vital signs were monitored during sedation. Patient tolerance: Patient tolerated the procedure well with no immediate complications. Joint: Left hip Reduction technique: Anterior traction with posterior pressure on the  pelvis  ____________________________________________   LABS (pertinent positives/negatives)  Labs Reviewed  CBC WITH DIFFERENTIAL/PLATELET - Abnormal; Notable for the following:       Result Value   RBC 2.99 (*)    Hemoglobin 9.9 (*)    HCT 29.0 (*)    Lymphs Abs 0.7 (*)    All other components within normal limits  COMPREHENSIVE METABOLIC PANEL - Abnormal; Notable for the following:    Glucose, Bld 174 (*)    BUN 43 (*)    Creatinine, Ser 2.20 (*)    Calcium 8.2 (*)    Total Protein 6.1 (*)    Albumin 2.8 (*)    ALT 11 (*)    GFR calc non Af Amer 27 (*)    GFR calc Af Amer 31 (*)    All other components within normal limits  TROPONIN I  CK    RADIOLOGY Images were viewed by me  Left hip x-ray  IMPRESSION: Dislocated left hip prosthesis. No convincing fracture.  IMPRESSION: Left hip dislocation seen previously has been reduced in the Interval.  ____________________________________________  FINAL ASSESSMENT AND PLAN  Fall, left hip pain, left hip dislocation status post reduction  Plan: Patient with labs and imaging as dictated above. Patient had a left hip dislocation that required procedural sedation and reduction. This was satisfactory, he is placed in knee immobilizer. I have consulted orthopedics, he is stable for outpatient follow-up.   Emily Filbert, MD   Note: This dictation was prepared with Dragon dictation. Any transcriptional errors that result from this process are unintentional    Emily Filbert, MD 04/02/16 2025

## 2016-04-02 NOTE — ED Triage Notes (Signed)
[facility].  Pt. Has pain to lt. Hip.  EMS reports hx of fracture to lt. Hip.  Pt. Denies LOC.  Pt. Has skin tear to lt. Forearm.

## 2016-04-03 ENCOUNTER — Encounter: Payer: Self-pay | Admitting: Emergency Medicine

## 2016-04-03 ENCOUNTER — Emergency Department: Payer: Medicare Other

## 2016-04-03 ENCOUNTER — Observation Stay
Admission: EM | Admit: 2016-04-03 | Discharge: 2016-04-08 | Disposition: A | Discharge status: Home / Self Care | Payer: Medicare Other | Attending: Internal Medicine | Admitting: Internal Medicine

## 2016-04-03 DIAGNOSIS — Z66 Do not resuscitate: Secondary | ICD-10-CM | POA: Insufficient documentation

## 2016-04-03 DIAGNOSIS — Z87891 Personal history of nicotine dependence: Secondary | ICD-10-CM | POA: Diagnosis not present

## 2016-04-03 DIAGNOSIS — F0390 Unspecified dementia without behavioral disturbance: Secondary | ICD-10-CM | POA: Diagnosis not present

## 2016-04-03 DIAGNOSIS — D62 Acute posthemorrhagic anemia: Secondary | ICD-10-CM | POA: Diagnosis not present

## 2016-04-03 DIAGNOSIS — E1122 Type 2 diabetes mellitus with diabetic chronic kidney disease: Secondary | ICD-10-CM | POA: Diagnosis not present

## 2016-04-03 DIAGNOSIS — N179 Acute kidney failure, unspecified: Secondary | ICD-10-CM | POA: Insufficient documentation

## 2016-04-03 DIAGNOSIS — Y998 Other external cause status: Secondary | ICD-10-CM | POA: Insufficient documentation

## 2016-04-03 DIAGNOSIS — T148XXA Other injury of unspecified body region, initial encounter: Secondary | ICD-10-CM | POA: Diagnosis present

## 2016-04-03 DIAGNOSIS — J449 Chronic obstructive pulmonary disease, unspecified: Secondary | ICD-10-CM | POA: Insufficient documentation

## 2016-04-03 DIAGNOSIS — E113219 Type 2 diabetes mellitus with mild nonproliferative diabetic retinopathy with macular edema, unspecified eye: Secondary | ICD-10-CM | POA: Diagnosis not present

## 2016-04-03 DIAGNOSIS — S51812A Laceration without foreign body of left forearm, initial encounter: Secondary | ICD-10-CM | POA: Diagnosis not present

## 2016-04-03 DIAGNOSIS — Y92129 Unspecified place in nursing home as the place of occurrence of the external cause: Secondary | ICD-10-CM | POA: Diagnosis not present

## 2016-04-03 DIAGNOSIS — I129 Hypertensive chronic kidney disease with stage 1 through stage 4 chronic kidney disease, or unspecified chronic kidney disease: Secondary | ICD-10-CM | POA: Diagnosis not present

## 2016-04-03 DIAGNOSIS — M170 Bilateral primary osteoarthritis of knee: Secondary | ICD-10-CM | POA: Insufficient documentation

## 2016-04-03 DIAGNOSIS — N183 Chronic kidney disease, stage 3 (moderate): Secondary | ICD-10-CM | POA: Diagnosis not present

## 2016-04-03 DIAGNOSIS — M6281 Muscle weakness (generalized): Secondary | ICD-10-CM

## 2016-04-03 DIAGNOSIS — Z794 Long term (current) use of insulin: Secondary | ICD-10-CM | POA: Insufficient documentation

## 2016-04-03 DIAGNOSIS — S7002XA Contusion of left hip, initial encounter: Secondary | ICD-10-CM | POA: Diagnosis not present

## 2016-04-03 DIAGNOSIS — E785 Hyperlipidemia, unspecified: Secondary | ICD-10-CM | POA: Insufficient documentation

## 2016-04-03 DIAGNOSIS — Z85828 Personal history of other malignant neoplasm of skin: Secondary | ICD-10-CM | POA: Insufficient documentation

## 2016-04-03 DIAGNOSIS — W19XXXA Unspecified fall, initial encounter: Secondary | ICD-10-CM | POA: Insufficient documentation

## 2016-04-03 DIAGNOSIS — N400 Enlarged prostate without lower urinary tract symptoms: Secondary | ICD-10-CM | POA: Insufficient documentation

## 2016-04-03 DIAGNOSIS — Z96642 Presence of left artificial hip joint: Secondary | ICD-10-CM | POA: Diagnosis not present

## 2016-04-03 DIAGNOSIS — R32 Unspecified urinary incontinence: Secondary | ICD-10-CM | POA: Insufficient documentation

## 2016-04-03 DIAGNOSIS — Y9389 Activity, other specified: Secondary | ICD-10-CM | POA: Diagnosis not present

## 2016-04-03 DIAGNOSIS — R262 Difficulty in walking, not elsewhere classified: Secondary | ICD-10-CM

## 2016-04-03 LAB — GLUCOSE, CAPILLARY
GLUCOSE-CAPILLARY: 72 mg/dL (ref 65–99)
GLUCOSE-CAPILLARY: 108 mg/dL — AB (ref 65–99)

## 2016-04-03 LAB — CBC WITH DIFFERENTIAL/PLATELET
BASOS ABS: 0.1 10*3/uL (ref 0–0.1)
BASOS PCT: 1 %
EOS PCT: 4 %
Eosinophils Absolute: 0.3 10*3/uL (ref 0–0.7)
HCT: 22.7 % — ABNORMAL LOW (ref 40.0–52.0)
HEMOGLOBIN: 7.7 g/dL — AB (ref 13.0–18.0)
Lymphocytes Relative: 9 %
Lymphs Abs: 0.6 10*3/uL — ABNORMAL LOW (ref 1.0–3.6)
MCH: 33.1 pg (ref 26.0–34.0)
MCHC: 33.9 g/dL (ref 32.0–36.0)
MCV: 97.7 fL (ref 80.0–100.0)
Monocytes Absolute: 0.8 10*3/uL (ref 0.2–1.0)
Monocytes Relative: 11 %
NEUTROS PCT: 75 %
Neutro Abs: 5.4 10*3/uL (ref 1.4–6.5)
PLATELETS: 194 10*3/uL (ref 150–440)
RBC: 2.33 MIL/uL — AB (ref 4.40–5.90)
RDW: 13.8 % (ref 11.5–14.5)
WBC: 7.3 10*3/uL (ref 3.8–10.6)

## 2016-04-03 LAB — BASIC METABOLIC PANEL
Anion gap: 6 (ref 5–15)
BUN: 49 mg/dL — AB (ref 6–20)
CO2: 28 mmol/L (ref 22–32)
CREATININE: 2.6 mg/dL — AB (ref 0.61–1.24)
Calcium: 8.3 mg/dL — ABNORMAL LOW (ref 8.9–10.3)
Chloride: 107 mmol/L (ref 101–111)
GFR calc Af Amer: 25 mL/min — ABNORMAL LOW (ref >60)
GFR, EST NON AFRICAN AMERICAN: 22 mL/min — AB (ref >60)
Glucose, Bld: 77 mg/dL (ref 65–99)
Potassium: 5 mmol/L (ref 3.5–5.1)
SODIUM: 141 mmol/L (ref 135–145)

## 2016-04-03 LAB — URINALYSIS, COMPLETE (UACMP) WITH MICROSCOPIC
BILIRUBIN URINE: NEGATIVE
GLUCOSE, UA: 50 mg/dL — AB
Hgb urine dipstick: NEGATIVE
Ketones, ur: NEGATIVE mg/dL
NITRITE: NEGATIVE
PH: 5 (ref 5.0–8.0)
Protein, ur: >=300 mg/dL — AB
Specific Gravity, Urine: 1.013 (ref 1.005–1.030)
Squamous Epithelial / LPF: NONE SEEN

## 2016-04-03 LAB — HEMOGLOBIN: HEMOGLOBIN: 7.9 g/dL — AB (ref 13.0–18.0)

## 2016-04-03 LAB — PROTIME-INR
INR: 1.01
Prothrombin Time: 13.3 seconds (ref 11.4–15.2)

## 2016-04-03 LAB — APTT: aPTT: 29 seconds (ref 24–36)

## 2016-04-03 MED ORDER — LISINOPRIL 10 MG PO TABS
10.0000 mg | ORAL_TABLET | Freq: Every day | ORAL | Status: DC
  Administered 2016-04-04 – 2016-04-08 (×4): 10 mg via ORAL
  Filled 2016-04-03 (×4): qty 1

## 2016-04-03 MED ORDER — SODIUM CHLORIDE 0.9 % IV BOLUS (SEPSIS)
500.0000 mL | Freq: Once | INTRAVENOUS | Status: AC
  Administered 2016-04-03: 500 mL via INTRAVENOUS

## 2016-04-03 MED ORDER — SODIUM CHLORIDE 0.9 % IV SOLN
INTRAVENOUS | Status: DC
  Administered 2016-04-03 – 2016-04-04 (×3): via INTRAVENOUS

## 2016-04-03 MED ORDER — ONDANSETRON HCL 4 MG/2ML IJ SOLN
4.0000 mg | Freq: Four times a day (QID) | INTRAMUSCULAR | Status: DC | PRN
Start: 2016-04-03 — End: 2016-04-08

## 2016-04-03 MED ORDER — INSULIN ASPART 100 UNIT/ML ~~LOC~~ SOLN
0.0000 [IU] | Freq: Three times a day (TID) | SUBCUTANEOUS | Status: DC
  Administered 2016-04-05: 5 [IU] via SUBCUTANEOUS
  Administered 2016-04-05: 1 [IU] via SUBCUTANEOUS
  Administered 2016-04-06: 2 [IU] via SUBCUTANEOUS
  Administered 2016-04-07: 5 [IU] via SUBCUTANEOUS
  Administered 2016-04-07: 2 [IU] via SUBCUTANEOUS
  Administered 2016-04-08: 1 [IU] via SUBCUTANEOUS
  Filled 2016-04-03 (×2): qty 2
  Filled 2016-04-03: qty 5
  Filled 2016-04-03: qty 2
  Filled 2016-04-03: qty 1
  Filled 2016-04-03: qty 3

## 2016-04-03 MED ORDER — INSULIN ASPART 100 UNIT/ML ~~LOC~~ SOLN
0.0000 [IU] | Freq: Every day | SUBCUTANEOUS | Status: DC
  Administered 2016-04-03: 0 [IU] via SUBCUTANEOUS
  Administered 2016-04-06: 3 [IU] via SUBCUTANEOUS
  Filled 2016-04-03: qty 3

## 2016-04-03 MED ORDER — LOPERAMIDE HCL 2 MG PO CAPS: 2.0000 mg | ORAL_CAPSULE | ORAL | Status: DC | PRN

## 2016-04-03 MED ORDER — ONDANSETRON HCL 4 MG PO TABS: 4.0000 mg | ORAL_TABLET | Freq: Four times a day (QID) | ORAL | Status: DC | PRN

## 2016-04-03 MED ORDER — MORPHINE SULFATE (PF) 2 MG/ML IV SOLN: 2.0000 mg | INTRAVENOUS | Status: DC | PRN

## 2016-04-03 MED ORDER — BISACODYL 5 MG PO TBEC
5.0000 mg | DELAYED_RELEASE_TABLET | Freq: Every day | ORAL | Status: DC | PRN
  Filled 2016-04-03: qty 1

## 2016-04-03 MED ORDER — ACETAMINOPHEN 325 MG PO TABS: 650.0000 mg | ORAL_TABLET | Freq: Four times a day (QID) | ORAL | Status: DC | PRN

## 2016-04-03 MED ORDER — ACETAMINOPHEN 650 MG RE SUPP: 650.0000 mg | Freq: Four times a day (QID) | RECTAL | Status: DC | PRN

## 2016-04-03 MED ORDER — VITAMIN D 1000 UNITS PO TABS
2000.0000 [IU] | ORAL_TABLET | Freq: Every day | ORAL | Status: DC
  Administered 2016-04-04 – 2016-04-07 (×3): 2000 [IU] via ORAL
  Filled 2016-04-03 (×3): qty 2

## 2016-04-03 MED ORDER — ALBUTEROL SULFATE (2.5 MG/3ML) 0.083% IN NEBU: 2.5000 mg | INHALATION_SOLUTION | RESPIRATORY_TRACT | Status: DC | PRN

## 2016-04-03 MED ORDER — DOCUSATE SODIUM 100 MG PO CAPS
100.0000 mg | ORAL_CAPSULE | Freq: Two times a day (BID) | ORAL | Status: DC
  Administered 2016-04-03 – 2016-04-07 (×7): 100 mg via ORAL
  Filled 2016-04-03 (×8): qty 1

## 2016-04-03 MED ORDER — FLUTICASONE PROPIONATE 50 MCG/ACT NA SUSP
2.0000 | Freq: Every day | NASAL | Status: DC
  Administered 2016-04-04 – 2016-04-08 (×3): 2 via NASAL
  Filled 2016-04-03: qty 16

## 2016-04-03 MED ORDER — ALBUTEROL SULFATE HFA 108 (90 BASE) MCG/ACT IN AERS: 2.0000 | INHALATION_SPRAY | Freq: Four times a day (QID) | RESPIRATORY_TRACT | Status: DC | PRN

## 2016-04-03 MED ORDER — MOMETASONE FURO-FORMOTEROL FUM 200-5 MCG/ACT IN AERO
2.0000 | INHALATION_SPRAY | Freq: Two times a day (BID) | RESPIRATORY_TRACT | Status: DC
  Administered 2016-04-03 – 2016-04-08 (×7): 2 via RESPIRATORY_TRACT
  Filled 2016-04-03: qty 8.8

## 2016-04-03 MED ORDER — SENNOSIDES-DOCUSATE SODIUM 8.6-50 MG PO TABS
1.0000 | ORAL_TABLET | Freq: Every evening | ORAL | Status: DC | PRN
Start: 2016-04-03 — End: 2016-04-08

## 2016-04-03 MED ORDER — RISAQUAD PO CAPS
1.0000 | ORAL_CAPSULE | Freq: Every day | ORAL | Status: DC
  Administered 2016-04-04 – 2016-04-08 (×4): 1 via ORAL
  Filled 2016-04-03 (×4): qty 1

## 2016-04-03 MED ORDER — FERROUS SULFATE 325 (65 FE) MG PO TABS
325.0000 mg | ORAL_TABLET | Freq: Three times a day (TID) | ORAL | Status: DC
  Administered 2016-04-04 – 2016-04-08 (×8): 325 mg via ORAL
  Filled 2016-04-03 (×10): qty 1

## 2016-04-03 MED ORDER — DONEPEZIL HCL 5 MG PO TABS
5.0000 mg | ORAL_TABLET | Freq: Every day | ORAL | Status: DC
  Administered 2016-04-03 – 2016-04-07 (×4): 5 mg via ORAL
  Filled 2016-04-03 (×4): qty 1

## 2016-04-03 MED ORDER — FENTANYL CITRATE (PF) 100 MCG/2ML IJ SOLN
25.0000 ug | Freq: Once | INTRAMUSCULAR | Status: AC
  Administered 2016-04-03: 25 ug via INTRAVENOUS
  Filled 2016-04-03: qty 2

## 2016-04-03 MED ORDER — INSULIN DETEMIR 100 UNIT/ML ~~LOC~~ SOLN
3.0000 [IU] | Freq: Every day | SUBCUTANEOUS | Status: DC
  Administered 2016-04-04 – 2016-04-05 (×2): 3 [IU] via SUBCUTANEOUS
  Filled 2016-04-03 (×2): qty 0.03

## 2016-04-03 MED ORDER — METOPROLOL TARTRATE 50 MG PO TABS
50.0000 mg | ORAL_TABLET | Freq: Two times a day (BID) | ORAL | Status: DC
  Administered 2016-04-03 – 2016-04-04 (×3): 50 mg via ORAL
  Filled 2016-04-03 (×4): qty 1

## 2016-04-03 MED ORDER — HYDROCODONE-ACETAMINOPHEN 5-325 MG PO TABS
1.0000 | ORAL_TABLET | ORAL | Status: DC | PRN
  Administered 2016-04-03 – 2016-04-04 (×2): 1 via ORAL
  Administered 2016-04-06 – 2016-04-07 (×2): 2 via ORAL
  Filled 2016-04-03: qty 1
  Filled 2016-04-03 (×2): qty 2
  Filled 2016-04-03: qty 1

## 2016-04-03 MED ORDER — MELATONIN 5 MG PO TABS
5.0000 mg | ORAL_TABLET | Freq: Every day | ORAL | Status: DC
  Administered 2016-04-03 – 2016-04-07 (×4): 5 mg via ORAL
  Filled 2016-04-03 (×6): qty 1

## 2016-04-03 NOTE — H&P (Addendum)
[employer] [facility] [place] [facility]   PATIENT NAME: Patrick Morrison    MR#:  [id number]  DATE OF BIRTH:  1933/06/01  DATE OF ADMISSION:  04/03/2016  PRIMARY CARE PHYSICIAN: Lynnea Ferrier, MD   REQUESTING/REFERRING PHYSICIAN: Jeanmarie Plant, MD  CHIEF COMPLAINT:   Chief Complaint  Patient presents with  . Placement Evaluation   Left hip dislocations and pain. HISTORY OF PRESENT ILLNESS:  Patrick Morrison  is a 83 y.o. male with a known history of Hypertension, diabetes, COPD, bilateral carotid artery stenosis and BPH. The patient was found yesterday and was diagnosed with the left hip dislocation. He was replaced yesterday and sent back to assisted living. He still complains of left hip pain, unable to walk. The CAT scan of the head showed left hip take him,. Hemoglobin decreased from 9.9-7.7. The patient has diminished her and incontinence.  PAST MEDICAL HISTORY:   Past Medical History:  Diagnosis Date  . Abnormal echocardiogram   . Asymptomatic bilateral carotid artery stenosis   . Bilateral knee pain   . BPH (benign prostatic hyperplasia)   . Bruit of right carotid artery   . Chronic rhinitis   . COPD (chronic obstructive pulmonary disease) (HCC)   . Diabetic macular edema (HCC)   . DM (diabetes mellitus) (HCC)   . Dysplasia of prostate   . Edema   . Hyperlipidemia   . Hypertension   . NPDR (nonproliferative diabetic retinopathy) (HCC)   . Osteoarthritis   . Personal history of other malignant neoplasm of skin   . Primary osteoarthritis of both knees   . Pseudophakia of both eyes     PAST SURGICAL HISTORY:   Past Surgical History:  Procedure Laterality Date  . ANKLE SURGERY    . COLECTOMY    . COLON SURGERY    . HIP ARTHROPLASTY Left 02/10/2016   Procedure: ARTHROPLASTY BIPOLAR HIP (HEMIARTHROPLASTY);  Surgeon: Christena Flake, MD;  Location: [facility] ORS;  Service: Orthopedics;  Laterality: Left;    SOCIAL HISTORY:   Social History  Substance  Use Topics  . Smoking status: Former Smoker    Packs/day: 1.00    Years: 60.00    Types: Cigarettes    Quit date: 11/24/2003  . Smokeless tobacco: Never Used  . Alcohol use No     Comment: occasional beer    FAMILY HISTORY:   Family History  Problem Relation Age of Onset  . Cancer Father   . Stroke Father     DRUG ALLERGIES:  No Known Allergies  REVIEW OF SYSTEMS:   Review of Systems  Unable to perform ROS: Dementia    MEDICATIONS AT HOME:   Prior to Admission medications   Medication Sig Start Date End Date Taking? Authorizing Provider  acetaminophen (TYLENOL) 500 MG tablet Take 500 mg by mouth every 6 (six) hours as needed.   Yes Historical Provider, MD  acidophilus (RISAQUAD) CAPS capsule Take by mouth daily.   Yes Historical Provider, MD  ADVAIR DISKUS 250-50 MCG/DOSE AEPB Take 1 puff by mouth 2 (two) times daily. 01/26/15  Yes Historical Provider, MD  albuterol (PROAIR HFA) 108 (90 Base) MCG/ACT inhaler Inhale 2 puffs into the lungs every 6 (six) hours as needed. 03/26/14  Yes Historical Provider, MD  Cholecalciferol (VITAMIN D3) 2000 units capsule Take 1 capsule by mouth daily.   Yes Historical Provider, MD  docusate sodium (COLACE) 100 MG capsule Take 1 capsule (100 mg total) by mouth 2 (two) times daily.  02/13/16  Yes Sital Mody, MD  donepezil (ARICEPT) 5 MG tablet Take 5 mg by mouth at bedtime.   Yes Historical Provider, MD  ferrous sulfate 325 (65 FE) MG tablet Take 1 tablet (325 mg total) by mouth 3 (three) times daily after meals. 02/13/16  Yes Sital Mody, MD  fluticasone (FLONASE) 50 MCG/ACT nasal spray Place 2 sprays into both nostrils daily.  01/12/15  Yes Historical Provider, MD  HYDROcodone-acetaminophen (NORCO/VICODIN) 5-325 MG tablet Take 1-2 tablets by mouth every 4 (four) hours as needed for moderate pain. 02/13/16  Yes Adrian SaranSital Mody, MD  Insulin Detemir (LEVEMIR FLEXTOUCH) 100 UNIT/ML Pen Inject 3 Units into the skin daily. 02/13/16  Yes Sital Mody, MD    lisinopril (PRINIVIL,ZESTRIL) 10 MG tablet Take 10 mg by mouth daily. 03/25/16  Yes Historical Provider, MD  loperamide (IMODIUM) 2 MG capsule Take 2 mg by mouth as needed for diarrhea or loose stools.   Yes Historical Provider, MD  Melatonin 3 MG TABS Take 1 tablet by mouth at bedtime. 02/25/15  Yes Historical Provider, MD  metoprolol (LOPRESSOR) 50 MG tablet Take 1 tablet (50 mg total) by mouth 2 (two) times daily. 02/13/16  Yes Adrian SaranSital Mody, MD  oxyCODONE-acetaminophen (PERCOCET) 5-325 MG tablet Take 2 tablets by mouth every 6 (six) hours as needed for moderate pain or severe pain. 04/02/16  Yes Emily FilbertJonathan E Williams, MD  amLODipine (NORVASC) 10 MG tablet Take 1 tablet (10 mg total) by mouth daily. Patient not taking: Reported on 04/02/2016 02/13/16   Adrian SaranSital Mody, MD  enoxaparin (LOVENOX) 30 MG/0.3ML injection Inject 0.3 mLs (30 mg total) into the skin daily. 02/13/16 02/27/16  Adrian SaranSital Mody, MD      VITAL SIGNS:  Blood pressure (!) 154/61, pulse (!) 53, temperature 97.5 F (36.4 C), temperature source Oral, resp. rate 16, height 5\' 7"  (1.702 m), weight 135 lb (61.2 kg), SpO2 100 %.  PHYSICAL EXAMINATION:  Physical Exam  GENERAL:  83 y.o.-year-old patient lying in the bed with no acute distress.  EYES: Pupils equal, round, reactive to light and accommodation. No scleral icterus. Extraocular muscles intact.  HEENT: Head atraumatic, normocephalic. Oropharynx and nasopharynx clear.  NECK:  Supple, no jugular venous distention. No thyroid enlargement, no tenderness.  LUNGS: Normal breath sounds bilaterally, no wheezing, rales,rhonchi or crepitation. No use of accessory muscles of respiration.  CARDIOVASCULAR: S1, S2 normal. No murmurs, rubs, or gallops.  ABDOMEN: Soft, nontender, nondistended. Bowel sounds present. No organomegaly or mass.  EXTREMITIES: No pedal edema, cyanosis, or clubbing. Left lower extremity on fixation. NEUROLOGIC: Unable to exam. PSYCHIATRIC: The patient is demented, only know  his name. SKIN: No obvious rash, lesion, or ulcer.   LABORATORY PANEL:   CBC  Recent Labs Lab 04/03/16 1153  WBC 7.3  HGB 7.7*  HCT 22.7*  PLT 194   ------------------------------------------------------------------------------------------------------------------  Chemistries   Recent Labs Lab 04/02/16 1749 04/03/16 1153  NA 138 141  K 3.8 5.0  CL 103 107  CO2 27 28  GLUCOSE 174* 77  BUN 43* 49*  CREATININE 2.20* 2.60*  CALCIUM 8.2* 8.3*  AST 19  --   ALT 11*  --   ALKPHOS 79  --   BILITOT 0.3  --    ------------------------------------------------------------------------------------------------------------------  Cardiac Enzymes  Recent Labs Lab 04/02/16 1749  TROPONINI <0.03   ------------------------------------------------------------------------------------------------------------------  RADIOLOGY:  Ct Hip Left Wo Contrast  Result Date: 04/03/2016 CLINICAL DATA:  Left hip prosthesis, dislocation yesterday with reduction, continued pain. EXAM: CT OF THE LEFT HIP  WITHOUT CONTRAST TECHNIQUE: Multidetector CT imaging of the left hip was performed according to the standard protocol. Multiplanar CT image reconstructions were also generated. COMPARISON:  01/31/2017 FINDINGS: Bones/Joint/Cartilage No convincing fracture. Faint linear bands of calcification along the anterior cortical margin of the proximal femur without cortical displacement, this is probably chronic. Spurring along the acetabulum without an acetabular fracture. No lucency around the stem portion of the implant. No lower sacral or coccygeal fracture is seen. Ligaments Suboptimally assessed by CT. Muscles and Tendons Prominent piriformis hematoma measuring about 13.7 by 4.2 cm on image 1/4, this extends with the muscle through the sciatic notch. The hematoma also appears to extend down along the posterior portion of the operator internus and into the quadratus femoris and gluteus maximus with associated  edema in these muscles and associated considerable subcutaneous edema in the region. The sciatic nerve is not impinged upon at the sciatic notch but is displaced by hematoma below the level of the sciatic notch. Nonspecific presacral edema along the lower sacrum. Suspected edema in the gluteus medius potentially with trochanteric bursitis. Soft tissues Extensive subcutaneous edema lateral and posterior to the left hip. IMPRESSION: 1. Considerable hematoma extending within and along the left piriformis, operator internus, quadratus femoris, and gluteus maximus muscles. The piriformis component of the hematoma extends within the muscle through the sciatic notch region, with both intrapelvic and extrapelvic components. Although there is no direct sciatic notch impingement on the sciatic nerve, the sciatic nerve is displaced by hematoma just below the level of the sciatic notch. 2. Edema in the gluteus medius with adjacent trochanteric bursitis. 3. Nonspecific presacral edema. 4. No convincing fracture. There is some calcification along the anterior periosteal margin of the left proximal femur which is probably incidental. Electronically Signed   By: Gaylyn Rong M.D.   On: 04/03/2016 12:06   Dg Hip Unilat W Or W/o Pelvis 2-3 Views Left  Result Date: 04/02/2016 CLINICAL DATA:  Status post left hip reduction. EXAM: DG HIP (WITH OR WITHOUT PELVIS) 2-3V LEFT COMPARISON:  Earlier the same day. FINDINGS: A single frontal view of the left hip shows interval reduction of the femoral component dislocation seen previously. No evidence for periprosthetic fracture. Bones are diffusely demineralized. IMPRESSION: Left hip dislocation seen previously has been reduced in the interval. Electronically Signed   By: Kennith Center M.D.   On: 04/02/2016 20:15   Dg Hip Unilat W Or W/o Pelvis 2-3 Views Left  Result Date: 04/02/2016 CLINICAL DATA:  Pt. Had fall at [facility]. Pt. Has pain to lt. Hip. EMS reports hx of fracture to  lt. Hip. Pt. Denies LOC. Pt. Has skin tear to lt. Forearm. EXAM: DG HIP (WITH OR WITHOUT PELVIS) 2-3V LEFT COMPARISON:  02/10/2016 FINDINGS: The left hip prosthesis has dislocated superior to the acetabulum. There is no convincing fracture. The prosthetic component appears well seated. SI joints, symphysis pubis and right hip joint are normally spaced and aligned. Bones are diffusely demineralized. There are dense aortoiliac and femoral vessel atherosclerotic calcifications. A bowel anastomosis staple line lies in the central pelvis. IMPRESSION: Dislocated left hip prosthesis.  No convincing fracture. Electronically Signed   By: Amie Portland M.D.   On: 04/02/2016 18:35      IMPRESSION AND PLAN:   Left hip hepatoma and Anemia due to acute blood loss. The patient will be placed for observation. Follow-up hemoglobin, Type and Cross, PRBC Transfusion When Necessary.  Left hip dislocation. S/p replacement. Pain control and supportive care. No indication for surgery  per Dr. Martha Clan.  Acute renal failure on CKD stage 4. Give gentle normal saline rehydration, follow-up BMP.  Hypertension. Continue hypertension medication. Diabetes, start sliding scale. Continue home Levemir.  Dementia. Fall precaution and aspiration precaution. PT evaluation.  All the records are reviewed and case discussed with ED provider. Management plans discussed with the patient, his wife and son and they are in agreement.  CODE STATUS: DO NOT RESUSCITATE  TOTAL TIME TAKING CARE OF THIS PATIENT: 52 minutes.    Shaune Pollack M.D on 04/03/2016 at 2:55 PM  Between 7am to 6pm - Pager - [phone]  After 6pm go to [email or web address] - Social research officer, government  [facility]  Office  [phone]  CC: Primary care physician; Lynnea Ferrier, MD   Note: This dictation was prepared with Dragon dictation along with smaller phrase technology. Any transcriptional errors that result from this process are  unintentional.

## 2016-04-03 NOTE — ED Notes (Addendum)
RN unable to verify hx with pt. Pt could not answer hx questions and facility did not send any hx except that the pt is diabetic.

## 2016-04-03 NOTE — ED Triage Notes (Signed)
Pt arrived to ED by EMS after staff called stating they could not care for the pt and that he needs rehab placement. Pt was seen here yesterday after a fall. Pt has left hip dislocation.

## 2016-04-03 NOTE — ED Provider Notes (Addendum)
[facility] Emergency Department Provider Note  ____________________________________________   I have reviewed the triage vital signs and the nursing notes.   HISTORY  Chief Complaint Placement Evaluation    HPI Patrick Morrison is a 83 y.o. male presents today complaining of hip pain. Patient has a history ofthat hip replacement, his hip was dislocated and replaced yesterday. He was sent back to his assisted living where it was found that he had too much pain for them to help him with his activities of daily life and he was sent back. Per EMS, they did not fill his prescription for narcotics. Patient in no acute distress but states he does have pain when he moves his hip.    Past Medical History:  Diagnosis Date  . Abnormal echocardiogram   . Asymptomatic bilateral carotid artery stenosis   . Bilateral knee pain   . BPH (benign prostatic hyperplasia)   . Bruit of right carotid artery   . Chronic rhinitis   . COPD (chronic obstructive pulmonary disease) (HCC)   . Diabetic macular edema (HCC)   . DM (diabetes mellitus) (HCC)   . Dysplasia of prostate   . Edema   . Hyperlipidemia   . Hypertension   . NPDR (nonproliferative diabetic retinopathy) (HCC)   . Osteoarthritis   . Personal history of other malignant neoplasm of skin   . Primary osteoarthritis of both knees   . Pseudophakia of both eyes     Patient Active Problem List   Diagnosis Date Noted  . Closed left hip fracture, initial encounter (HCC) 02/09/2016    Past Surgical History:  Procedure Laterality Date  . ANKLE SURGERY    . COLECTOMY    . COLON SURGERY    . HIP ARTHROPLASTY Left 02/10/2016   Procedure: ARTHROPLASTY BIPOLAR HIP (HEMIARTHROPLASTY);  Surgeon: Christena Flake, MD;  Location: [facility] ORS;  Service: Orthopedics;  Laterality: Left;    Prior to Admission medications   Medication Sig Start Date End Date Taking? Authorizing Provider  acetaminophen (TYLENOL) 500 MG tablet Take  500 mg by mouth every 6 (six) hours as needed.   Yes Historical Provider, MD  acidophilus (RISAQUAD) CAPS capsule Take by mouth daily.   Yes Historical Provider, MD  ADVAIR DISKUS 250-50 MCG/DOSE AEPB Take 1 puff by mouth 2 (two) times daily. 01/26/15  Yes Historical Provider, MD  albuterol (PROAIR HFA) 108 (90 Base) MCG/ACT inhaler Inhale 2 puffs into the lungs every 6 (six) hours as needed. 03/26/14  Yes Historical Provider, MD  Cholecalciferol (VITAMIN D3) 2000 units capsule Take 1 capsule by mouth daily.   Yes Historical Provider, MD  docusate sodium (COLACE) 100 MG capsule Take 1 capsule (100 mg total) by mouth 2 (two) times daily. 02/13/16  Yes Sital Mody, MD  donepezil (ARICEPT) 5 MG tablet Take 5 mg by mouth at bedtime.   Yes Historical Provider, MD  ferrous sulfate 325 (65 FE) MG tablet Take 1 tablet (325 mg total) by mouth 3 (three) times daily after meals. 02/13/16  Yes Sital Mody, MD  fluticasone (FLONASE) 50 MCG/ACT nasal spray Place 2 sprays into both nostrils daily.  01/12/15  Yes Historical Provider, MD  HYDROcodone-acetaminophen (NORCO/VICODIN) 5-325 MG tablet Take 1-2 tablets by mouth every 4 (four) hours as needed for moderate pain. 02/13/16  Yes Adrian Saran, MD  Insulin Detemir (LEVEMIR FLEXTOUCH) 100 UNIT/ML Pen Inject 3 Units into the skin daily. 02/13/16  Yes Sital Mody, MD  lisinopril (PRINIVIL,ZESTRIL) 10 MG tablet Take 10  mg by mouth daily. 03/25/16  Yes Historical Provider, MD  loperamide (IMODIUM) 2 MG capsule Take 2 mg by mouth as needed for diarrhea or loose stools.   Yes Historical Provider, MD  Melatonin 3 MG TABS Take 1 tablet by mouth at bedtime. 02/25/15  Yes Historical Provider, MD  metoprolol (LOPRESSOR) 50 MG tablet Take 1 tablet (50 mg total) by mouth 2 (two) times daily. 02/13/16  Yes Adrian SaranSital Mody, MD  oxyCODONE-acetaminophen (PERCOCET) 5-325 MG tablet Take 2 tablets by mouth every 6 (six) hours as needed for moderate pain or severe pain. 04/02/16  Yes Emily FilbertJonathan E  Williams, MD  amLODipine (NORVASC) 10 MG tablet Take 1 tablet (10 mg total) by mouth daily. Patient not taking: Reported on 04/02/2016 02/13/16   Adrian SaranSital Mody, MD  enoxaparin (LOVENOX) 30 MG/0.3ML injection Inject 0.3 mLs (30 mg total) into the skin daily. 02/13/16 02/27/16  Adrian SaranSital Mody, MD    Allergies Patient has no known allergies.  No family history on file.  Social History Social History  Substance Use Topics  . Smoking status: Former Smoker    Packs/day: 1.00    Years: 60.00    Types: Cigarettes    Quit date: 11/24/2003  . Smokeless tobacco: Never Used  . Alcohol use No     Comment: occasional beer    Review of Systems Constitutional: No fever/chills Eyes: No visual changes. ENT: No sore throat. No stiff neck no neck pain Cardiovascular: Denies chest pain. Respiratory: Denies shortness of breath. Gastrointestinal:   no vomiting.  No diarrhea.  No constipation. Genitourinary: Negative for dysuria. Musculoskeletal: Negative lower extremity swelling Skin: Negative for rash. Neurological: Negative for severe headaches, focal weakness or numbness. 10-point ROS otherwise negative.  ____________________________________________   PHYSICAL EXAM:  VITAL SIGNS: ED Triage Vitals  Enc Vitals Group     BP 04/03/16 1030 (!) 154/61     Pulse Rate 04/03/16 1030 (!) 53     Resp 04/03/16 1030 16     Temp 04/03/16 1030 97.5 F (36.4 C)     Temp Source 04/03/16 1030 Oral     SpO2 04/03/16 1014 97 %     Weight 04/03/16 1031 135 lb (61.2 kg)     Height 04/03/16 1031 5\' 7"  (1.702 m)     Head Circumference --      Peak Flow --      Pain Score --      Pain Loc --      Pain Edu? --      Excl. in GC? --     Constitutional: Alert and In no acute distress Well appearing and in no acute distress. Eyes: Conjunctivae are normal. PERRL. EOMI. Head: Atraumatic. Nose: No congestion/rhinnorhea. Mouth/Throat: Mucous membranes are moist.  Oropharynx non-erythematous. Neck: No stridor.    Nontender with no meningismus Cardiovascular: Normal rate, regular rhythm. Grossly normal heart sounds.  Good peripheral circulation. Respiratory: Normal respiratory effort.  No retractions. Lungs CTAB. Abdominal: Soft and nontender. No distention. No guarding no rebound Back:  There is no focal tenderness or step off.  there is no midline tenderness there are no lesions noted. there is no CVA tenderness Musculoskeletal: Chest palpation left hip, we'll defer range. Because of recent dislocation but we'll obtain imaging, no upper extremity tenderness. No joint effusions, no DVT signs strong distal pulses no edema Neurologic:  Normal speech and language. No gross focal neurologic deficits are appreciated.  Skin:  Skin is warm, dry and intact. No rash noted. Psychiatric: Mood and  affect are normal. Speech and behavior are normal.  ____________________________________________   LABS (all labs ordered are listed, but only abnormal results are displayed)  Labs Reviewed  BASIC METABOLIC PANEL  CBC WITH DIFFERENTIAL/PLATELET  URINALYSIS, COMPLETE (UACMP) WITH MICROSCOPIC   ____________________________________________  EKG  I personally interpreted any EKGs ordered by me or triage  ____________________________________________  RADIOLOGY  I reviewed any imaging ordered by me or triage that were performed during my shift and, if possible, patient and/or family made aware of any abnormal findings. ____________________________________________   PROCEDURES  Procedure(s) performed: None  Procedures  Critical Care performed: None  ____________________________________________   INITIAL IMPRESSION / ASSESSMENT AND PLAN / ED COURSE  Pertinent labs & imaging results that were available during my care of the patient were reviewed by me and considered in my medical decision making (see chart for details).  Patient no acute distress in over here for pain with motion. He does have  significant discomfort when I palpate the left hip. We will obtain a CT scan to rule out acetabular other injury to the extent that one can with an artificial hip we'll check basic blood work is appears somewhat dehydrated to me and we'll reassess.   ----------------------------------------- 1:21 PM on 04/03/2016 -----------------------------------------  Patient with a large hematoma discussed with Dr. Martha Clan, who feels that supportive care is also required. He is not surprised by this finding. Patient's vital signs a been reassuring. Blood work does show that he has dropped his hemoglobin. We will send a type and screen. He is not known to be on anticoagulation, but we will also ensure that she extent that we can verify that it is not elevated. Orthopedics would like me to admit to the hospital service which we will do.  Clinical Course    ____________________________________________   FINAL CLINICAL IMPRESSION(S) / ED DIAGNOSES  Final diagnoses:  None      This chart was dictated using voice recognition software.  Despite best efforts to proofread,  errors can occur which can change meaning.      Jeanmarie Plant, MD 04/03/16 1127    Jeanmarie Plant, MD 04/03/16 1322

## 2016-04-03 NOTE — Progress Notes (Signed)
Sent text page to Dr. Imogene Burnhen to let him know that nurse has yellow DNR on patient's chart.

## 2016-04-03 NOTE — ED Notes (Signed)
Patient transported to CT 

## 2016-04-03 NOTE — ED Notes (Signed)
Informed RN bed ready [phone]

## 2016-04-03 NOTE — ED Notes (Signed)
Pt sent her by facility for "Reevalution" for placement in rehab facility. Pt is NAD and pain present only with movement.

## 2016-04-04 LAB — GLUCOSE, CAPILLARY
GLUCOSE-CAPILLARY: 99 mg/dL (ref 65–99)
Glucose-Capillary: 96 mg/dL (ref 65–99)
Glucose-Capillary: 64 mg/dL — ABNORMAL LOW (ref 65–99)
Glucose-Capillary: 97 mg/dL (ref 65–99)

## 2016-04-04 LAB — CBC
HCT: 20.5 % — ABNORMAL LOW (ref 40.0–52.0)
Hemoglobin: 6.9 g/dL — ABNORMAL LOW (ref 13.0–18.0)
MCH: 32.5 pg (ref 26.0–34.0)
MCHC: 33.5 g/dL (ref 32.0–36.0)
MCV: 96.9 fL (ref 80.0–100.0)
PLATELETS: 172 10*3/uL (ref 150–440)
RBC: 2.11 MIL/uL — AB (ref 4.40–5.90)
RDW: 13.3 % (ref 11.5–14.5)
WBC: 6.2 10*3/uL (ref 3.8–10.6)

## 2016-04-04 LAB — BASIC METABOLIC PANEL
Anion gap: 8 (ref 5–15)
BUN: 42 mg/dL — ABNORMAL HIGH (ref 6–20)
CALCIUM: 8 mg/dL — AB (ref 8.9–10.3)
CO2: 23 mmol/L (ref 22–32)
CREATININE: 2.36 mg/dL — AB (ref 0.61–1.24)
Chloride: 108 mmol/L (ref 101–111)
GFR calc non Af Amer: 24 mL/min — ABNORMAL LOW (ref >60)
GFR, EST AFRICAN AMERICAN: 28 mL/min — AB (ref >60)
GLUCOSE: 101 mg/dL — AB (ref 65–99)
Potassium: 4.6 mmol/L (ref 3.5–5.1)
Sodium: 139 mmol/L (ref 135–145)

## 2016-04-04 LAB — PREPARE RBC (CROSSMATCH)

## 2016-04-04 LAB — MRSA PCR SCREENING: MRSA by PCR: NEGATIVE

## 2016-04-04 MED ORDER — ENSURE ENLIVE PO LIQD
237.0000 mL | Freq: Two times a day (BID) | ORAL | Status: DC
  Administered 2016-04-04 – 2016-04-08 (×7): 237 mL via ORAL

## 2016-04-04 MED ORDER — SODIUM CHLORIDE 0.9 % IV SOLN
Freq: Once | INTRAVENOUS | Status: AC
  Administered 2016-04-04: 15:00:00 via INTRAVENOUS

## 2016-04-04 NOTE — Progress Notes (Signed)
Initial Nutrition Assessment  DOCUMENTATION CODES:   Severe malnutrition in context of chronic illness  INTERVENTION:  1. Ensure Enlive po BID, each supplement provides 350 kcal and 20 grams of protein 2. Discussed appetite stimulant with MD Sainani, not appropriate at this time  NUTRITION DIAGNOSIS:   Malnutrition related to chronic illness as evidenced by severe depletion of muscle mass, severe depletion of body fat.  GOAL:   Patient will meet greater than or equal to 90% of their needs  MONITOR:   PO intake, I & O's, Labs, Weight trends, Supplement acceptance  REASON FOR ASSESSMENT:   Malnutrition Screening Tool    ASSESSMENT:   Patrick Morrison  is a 83 y.o. male with a known history of Hypertension, diabetes, COPD, bilateral carotid artery stenosis and BPH. The patient was found yesterday and was diagnosed with the left hip dislocation.  Attempted to speak with Patrick Morrison at bedside, patient demented, unable to respond appropriately. PO intake thus far has been little to none. Wife ate both his breakfast and lunch trays. Nutrition-Focused physical exam completed. Findings are severe fat depletion, severe muscle depletion, and no edema.  Weight has been stable. Patient does not like Glucerna will try Ensure, but ultimately GOC need to be determined for patient if PO does not improve.  Labs and medications reviewed: Colace, Vitamin D, Iron NS @ 3950mL/hr  Diet Order:  Diet heart healthy/carb modified Room service appropriate? Yes; Fluid consistency: Thin  Skin:  Reviewed, no issues  Last BM:  04/02/2016  Height:   Ht Readings from Last 1 Encounters:  04/03/16 5\' 6"  (1.676 m)    Weight:   Wt Readings from Last 1 Encounters:  04/03/16 127 lb 4.8 oz (57.7 kg)    Ideal Body Weight:  64.54 kg  BMI:  Body mass index is 20.55 kg/m.  Estimated Nutritional Needs:   Kcal:  1360-1480 calories (MSJ x1.1-1.2)  Protein:  57-70 gm  Fluid:  >/= 1.3L  EDUCATION NEEDS:    No education needs identified at this time  Dionne AnoWilliam M. Estel Scholze, MS, RD LDN Inpatient Clinical Dietitian Pager [phone]

## 2016-04-04 NOTE — Progress Notes (Addendum)
[facility] [place] [facility]   PATIENT NAME: Patrick Morrison    MR#:  [id number]  DATE OF BIRTH:  [birth year]  SUBJECTIVE:   Pt. Here after a fall and noted to have Left hip Hematoma.  Wife at bedside.  No other acute events overnight.   REVIEW OF SYSTEMS:    Review of Systems  Unable to perform ROS: Mental acuity    Nutrition: Heart Healthy/Carb modified Tolerating Diet: Yes but very little Tolerating PT:  Await eval.    DRUG ALLERGIES:  No Known Allergies  VITALS:  Blood pressure (!) 171/55, pulse (!) 53, temperature 98.1 F (36.7 C), temperature source Oral, resp. rate 17, height 5\' 6"  (1.676 m), weight 57.7 kg (127 lb 4.8 oz), SpO2 95 %.  PHYSICAL EXAMINATION:   Physical Exam  GENERAL:  83 y.o.-year-old cachectic patient lying in bed in no acute distress.  EYES: Pupils equal, round, reactive to light and accommodation. No scleral icterus. Extraocular muscles intact.  HEENT: Head atraumatic, normocephalic. Oropharynx and nasopharynx clear.  NECK:  Supple, no jugular venous distention. No thyroid enlargement, no tenderness.  LUNGS: Normal breath sounds bilaterally, no wheezing, rales, rhonchi. No use of accessory muscles of respiration.  CARDIOVASCULAR: S1, S2 normal. No murmurs, rubs, or gallops.  ABDOMEN: Soft, nontender, nondistended. Bowel sounds present. No organomegaly or mass.  EXTREMITIES: No cyanosis, clubbing or edema b/l. Left left splint in place.    NEUROLOGIC: Cranial nerves II through XII are intact. No focal Motor or sensory deficits b/l. Globally weak  PSYCHIATRIC: The patient is alert and oriented x 1.  SKIN: No obvious rash, lesion, or ulcer.    LABORATORY PANEL:   CBC  Recent Labs Lab 04/04/16 1006  WBC 6.2  HGB 6.9*  HCT 20.5*  PLT 172   ------------------------------------------------------------------------------------------------------------------  Chemistries   Recent Labs Lab 04/02/16 1749  04/04/16 1006   NA 138  < > 139  K 3.8  < > 4.6  CL 103  < > 108  CO2 27  < > 23  GLUCOSE 174*  < > 101*  BUN 43*  < > 42*  CREATININE 2.20*  < > 2.36*  CALCIUM 8.2*  < > 8.0*  AST 19  --   --   ALT 11*  --   --   ALKPHOS 79  --   --   BILITOT 0.3  --   --   < > = values in this interval not displayed. ------------------------------------------------------------------------------------------------------------------  Cardiac Enzymes  Recent Labs Lab 04/02/16 1749  TROPONINI <0.03   ------------------------------------------------------------------------------------------------------------------  RADIOLOGY:  Ct Hip Left Wo Contrast  Result Date: 04/03/2016 CLINICAL DATA:  Left hip prosthesis, dislocation yesterday with reduction, continued pain. EXAM: CT OF THE LEFT HIP WITHOUT CONTRAST TECHNIQUE: Multidetector CT imaging of the left hip was performed according to the standard protocol. Multiplanar CT image reconstructions were also generated. COMPARISON:  01/31/2017 FINDINGS: Bones/Joint/Cartilage No convincing fracture. Faint linear bands of calcification along the anterior cortical margin of the proximal femur without cortical displacement, this is probably chronic. Spurring along the acetabulum without an acetabular fracture. No lucency around the stem portion of the implant. No lower sacral or coccygeal fracture is seen. Ligaments Suboptimally assessed by CT. Muscles and Tendons Prominent piriformis hematoma measuring about 13.7 by 4.2 cm on image 1/4, this extends with the muscle through the sciatic notch. The hematoma also appears to extend down along the posterior portion of the operator internus and into the quadratus femoris and  gluteus maximus with associated edema in these muscles and associated considerable subcutaneous edema in the region. The sciatic nerve is not impinged upon at the sciatic notch but is displaced by hematoma below the level of the sciatic notch. Nonspecific presacral edema  along the lower sacrum. Suspected edema in the gluteus medius potentially with trochanteric bursitis. Soft tissues Extensive subcutaneous edema lateral and posterior to the left hip. IMPRESSION: 1. Considerable hematoma extending within and along the left piriformis, operator internus, quadratus femoris, and gluteus maximus muscles. The piriformis component of the hematoma extends within the muscle through the sciatic notch region, with both intrapelvic and extrapelvic components. Although there is no direct sciatic notch impingement on the sciatic nerve, the sciatic nerve is displaced by hematoma just below the level of the sciatic notch. 2. Edema in the gluteus medius with adjacent trochanteric bursitis. 3. Nonspecific presacral edema. 4. No convincing fracture. There is some calcification along the anterior periosteal margin of the left proximal femur which is probably incidental. Electronically Signed   By: Gaylyn Rong M.D.   On: 04/03/2016 12:06   Dg Hip Unilat W Or W/o Pelvis 2-3 Views Left  Result Date: 04/02/2016 CLINICAL DATA:  Status post left hip reduction. EXAM: DG HIP (WITH OR WITHOUT PELVIS) 2-3V LEFT COMPARISON:  Earlier the same day. FINDINGS: A single frontal view of the left hip shows interval reduction of the femoral component dislocation seen previously. No evidence for periprosthetic fracture. Bones are diffusely demineralized. IMPRESSION: Left hip dislocation seen previously has been reduced in the interval. Electronically Signed   By: Kennith Center M.D.   On: 04/02/2016 20:15   Dg Hip Unilat W Or W/o Pelvis 2-3 Views Left  Result Date: 04/02/2016 CLINICAL DATA:  Pt. Had fall at [facility]. Pt. Has pain to lt. Hip. EMS reports hx of fracture to lt. Hip. Pt. Denies LOC. Pt. Has skin tear to lt. Forearm. EXAM: DG HIP (WITH OR WITHOUT PELVIS) 2-3V LEFT COMPARISON:  02/10/2016 FINDINGS: The left hip prosthesis has dislocated superior to the acetabulum. There is no convincing fracture.  The prosthetic component appears well seated. SI joints, symphysis pubis and right hip joint are normally spaced and aligned. Bones are diffusely demineralized. There are dense aortoiliac and femoral vessel atherosclerotic calcifications. A bowel anastomosis staple line lies in the central pelvis. IMPRESSION: Dislocated left hip prosthesis.  No convincing fracture. Electronically Signed   By: Amie Portland M.D.   On: 04/02/2016 18:35     ASSESSMENT AND PLAN:   83 year old male with past medical history of dementia, hypertension, hyperlipidemia, diabetes, COPD, BPH, history of osteoarthritis who presented to the hospital after a fall and noted to have a left hip hematoma.  1. Left hip hematoma-secondary to a traumatic fall. This was noted on his CT scan on admission. -Continue supportive care with pain control, follow serial hemoglobin. Hemoglobin stone to 6.9 today and we'll transfuse 1 unit of packed red blood cells. I will get orthopedic consult. - cont. Supportive care for now. Await PT Eval.    2. Acute blood loss anemia - secondary on Hematoma.  - will transfuse one unit and follow Hg.   3. DM type II - cont. Lantus, SSI.   - follow BS.    4. HTN - cont. Metoprolol, Lisinopril.   5. COPD - no acute exacerbation.  - cont. Dulera, albuterol nebs as needed  6. CKD Stage III - Cr. Close to baseline and will cont. To monitor.  - renal dose meds.  All the records are reviewed and case discussed with Care Management/Social Worker. Management plans discussed with the patient, family and they are in agreement.  CODE STATUS: DNR  DVT Prophylaxis: Hep SQ  TOTAL TIME TAKING CARE OF THIS PATIENT: 30 minutes.   POSSIBLE D/C IN 2-3 DAYS, DEPENDING ON CLINICAL CONDITION.   Houston SirenSAINANI,VIVEK J M.D on 04/04/2016 at 3:09 PM  Between 7am to 6pm - Pager - [phone]  After 6pm go to [email or web address] - Social research officer, governmentpassword [email or web address] [email or web address]  [employer] [facility]  Office   [phone]  CC: Primary care physician; Lynnea FerrierBERT J KLEIN III, MD

## 2016-04-04 NOTE — [place] FL2 (Signed)
[place] MEDICAID FL2 LEVEL OF CARE SCREENING TOOL     IDENTIFICATION  Patient Name: Patrick Morrison Birthdate: 08-Nov-1933 Sex: male Admission Date (Current Location): 04/03/2016  [place] and [place] Number:  Chiropodist and Address:  [facility], [address] [place], [place] [address]      Provider Number: [id number]  Attending Physician Name and Address:  Houston Siren, MD  Relative Name and Phone Number:       Current Level of Care: Hospital Recommended Level of Care: Skilled Nursing Facility Prior Approval Number:    Date Approved/Denied:   PASRR Number:  [id number])  Discharge Plan: SNF    Current Diagnoses: Patient Active Problem List   Diagnosis Date Noted  . Hip hematoma, left, initial encounter 04/03/2016  . Closed left hip fracture, initial encounter (HCC) 02/09/2016    Orientation RESPIRATION BLADDER Height & Weight     Self, Time, Situation, Place  Normal Continent Weight: 127 lb 4.8 oz (57.7 kg) Height:  5\' 6"  (167.6 cm)  BEHAVIORAL SYMPTOMS/MOOD NEUROLOGICAL BOWEL NUTRITION STATUS   (none)  (none) Continent Diet (Diet: Heart Healthy/ Carb Modified )  AMBULATORY STATUS COMMUNICATION OF NEEDS Skin   Extensive Assist Verbally Normal                       Personal Care Assistance Level of Assistance  Bathing, Feeding, Dressing Bathing Assistance: Limited assistance Feeding assistance: Independent Dressing Assistance: Limited assistance     Functional Limitations Info  Sight, Hearing, Speech Sight Info: Adequate Hearing Info: Adequate Speech Info: Adequate    SPECIAL CARE FACTORS FREQUENCY  PT (By licensed PT), OT (By licensed OT)     PT Frequency:  (5) OT Frequency:  (5)            Contractures      Additional Factors Info  Code Status, Allergies, Insulin Sliding Scale, Isolation Precautions Code Status Info:  (DNR ) Allergies Info:  (No Known Allergies. )   Insulin Sliding Scale  Info:  (NovoLog Insulin Injections ) Isolation Precautions Info:  (History of MRSA Nasal Swab. )     Current Medications (04/04/2016):  This is the current hospital active medication list Current Facility-Administered Medications  Medication Dose Route Frequency Provider Last Rate Last Dose  . 0.9 %  sodium chloride infusion   Intravenous Continuous Shaune Pollack, MD 50 mL/hr at 04/04/16 2000    . acetaminophen (TYLENOL) tablet 650 mg  650 mg Oral Q6H PRN Shaune Pollack, MD       Or  . acetaminophen (TYLENOL) suppository 650 mg  650 mg Rectal Q6H PRN Shaune Pollack, MD      . acidophilus (RISAQUAD) capsule 1 capsule  1 capsule Oral Daily Shaune Pollack, MD   1 capsule at 04/04/16 0845  . albuterol (PROVENTIL) (2.5 MG/3ML) 0.083% nebulizer solution 2.5 mg  2.5 mg Nebulization Q2H PRN Shaune Pollack, MD      . bisacodyl (DULCOLAX) EC tablet 5 mg  5 mg Oral Daily PRN Shaune Pollack, MD      . cholecalciferol (VITAMIN D) tablet 2,000 Units  2,000 Units Oral Daily Shaune Pollack, MD   2,000 Units at 04/04/16 0845  . docusate sodium (COLACE) capsule 100 mg  100 mg Oral BID Shaune Pollack, MD   100 mg at 04/04/16 2007  . donepezil (ARICEPT) tablet 5 mg  5 mg Oral QHS Shaune Pollack, MD   5 mg at 04/04/16 2007  . feeding supplement (  ENSURE ENLIVE) (ENSURE ENLIVE) liquid 237 mL  237 mL Oral BID BM Houston SirenVivek J Sainani, MD   237 mL at 04/04/16 1846  . ferrous sulfate tablet 325 mg  325 mg Oral TID PC Shaune PollackQing Chen, MD   325 mg at 04/04/16 1846  . fluticasone (FLONASE) 50 MCG/ACT nasal spray 2 spray  2 spray Each Nare Daily Shaune PollackQing Chen, MD   2 spray at 04/04/16 0846  . HYDROcodone-acetaminophen (NORCO/VICODIN) 5-325 MG per tablet 1-2 tablet  1-2 tablet Oral Q4H PRN Shaune PollackQing Chen, MD   1 tablet at 04/04/16 1846  . insulin aspart (novoLOG) injection 0-5 Units  0-5 Units Subcutaneous QHS Shaune PollackQing Chen, MD   0 Units at 04/03/16 2214  . insulin aspart (novoLOG) injection 0-9 Units  0-9 Units Subcutaneous TID WC Shaune PollackQing Chen, MD      . insulin detemir (LEVEMIR) injection 3  Units  3 Units Subcutaneous Daily Shaune PollackQing Chen, MD   3 Units at 04/04/16 1112  . lisinopril (PRINIVIL,ZESTRIL) tablet 10 mg  10 mg Oral Daily Shaune PollackQing Chen, MD   10 mg at 04/04/16 0844  . loperamide (IMODIUM) capsule 2 mg  2 mg Oral PRN Shaune PollackQing Chen, MD      . Melatonin TABS 5 mg  5 mg Oral QHS Shaune PollackQing Chen, MD   5 mg at 04/04/16 2008  . metoprolol (LOPRESSOR) tablet 50 mg  50 mg Oral BID Shaune PollackQing Chen, MD   50 mg at 04/04/16 2007  . mometasone-formoterol (DULERA) 200-5 MCG/ACT inhaler 2 puff  2 puff Inhalation BID Shaune PollackQing Chen, MD   2 puff at 04/04/16 1958  . morphine 2 MG/ML injection 2 mg  2 mg Intravenous Q4H PRN Shaune PollackQing Chen, MD      . ondansetron [facility](ZOFRAN) tablet 4 mg  4 mg Oral Q6H PRN Shaune PollackQing Chen, MD       Or  . ondansetron [facility](ZOFRAN) injection 4 mg  4 mg Intravenous Q6H PRN Shaune PollackQing Chen, MD      . senna-docusate (Senokot-S) tablet 1 tablet  1 tablet Oral QHS PRN Shaune PollackQing Chen, MD         Discharge Medications: Please see discharge summary for a list of discharge medications.  Relevant Imaging Results:  Relevant Lab Results:   Additional Information  (SSN: [id number])  Jerico Grisso, Darleen CrockerBailey M, LCSW

## 2016-04-04 NOTE — Progress Notes (Signed)
Nurse gave patient 4oz orange juice. Recheck sugar.

## 2016-04-04 NOTE — Care Management Obs Status (Signed)
MEDICARE OBSERVATION STATUS NOTIFICATION   Patient Details  Name: Patrick Morrison E Emme MRN: [id number] Date of Birth: 1933/09/07   Medicare Observation Status Notification Given:  Yes    Marily MemosLisa M Emeree Mahler, RN 04/04/2016, 3:41 PM

## 2016-04-04 NOTE — Care Management (Signed)
RNCM consult for discharge planning. Met with patient at bedside. PT pending and Ortho consult pending. Following to assist as needed

## 2016-04-04 NOTE — Progress Notes (Signed)
I spoke with Horris LatinoLance McGhee, PA who checked with Dr. Joice LoftsPoggi and he has agreed to see the patient in consultation.

## 2016-04-04 NOTE — Progress Notes (Signed)
Per chart [facility] because they could not meet his needs. PT is needed to place patient at a SNF. FL2 completed and bed search started.   Baker Hughes IncorporatedBailey Anilah Huck, LCSW [phone]

## 2016-04-04 NOTE — Consult Note (Signed)
ORTHOPAEDIC CONSULTATION  REQUESTING PHYSICIAN: Houston SirenVivek J Sainani, MD  Chief Complaint:   Left hip injury status post recent left hip hemiarthroplasty.  History of Present Illness: Patrick Morrison is a 83 y.o. male with multiple medical problems who is now nearly 8 weeks status post a left hip hemiarthroplasty for a displaced left femoral neck fracture. The patient's postoperative course has been notable for slow progress in physical therapy due to poor patient motivation and cooperation, but there have been no surgical problems. The patient was apparently in his usual state of health and the nursing home until he lost his balance and fell on Saturday morning, dislocating his hip prosthesis. He was brought to the emergency room where the prosthesis was reduced under IV sedation by the ER staff. He was sent back to the rehabilitation facility in a knee immobilizer. Apparently, he was noted to have increased swelling around the left hip so he was brought back to the emergency room on 04/03/16. A CT scan was obtained which demonstrated a concentrically reduced hip prosthesis with no only fractures but moderate hematoma and soft tissue swelling around the hip, as expected. The patient's hemoglobin was noted to be only 7.7, so he was admitted to the medicine service for transfusion and medical stabilization. The patient apparently did not strike his head or lose consciousness as a result of the fall. He also denies any lightheadedness, dizziness, chest pain, shortness breath, or other symptoms that may have precipitated his fall.   Past Medical History:  Diagnosis Date  . Abnormal echocardiogram   . Asymptomatic bilateral carotid artery stenosis   . Bilateral knee pain   . BPH (benign prostatic hyperplasia)   . Bruit of right carotid artery   . Chronic rhinitis   . COPD (chronic obstructive pulmonary disease) (HCC)   . Diabetic macular edema  (HCC)   . DM (diabetes mellitus) (HCC)   . Dysplasia of prostate   . Edema   . Hyperlipidemia   . Hypertension   . NPDR (nonproliferative diabetic retinopathy) (HCC)   . Osteoarthritis   . Personal history of other malignant neoplasm of skin   . Primary osteoarthritis of both knees   . Pseudophakia of both eyes    Past Surgical History:  Procedure Laterality Date  . ANKLE SURGERY    . COLECTOMY    . COLON SURGERY    . HIP ARTHROPLASTY Left 02/10/2016   Procedure: ARTHROPLASTY BIPOLAR HIP (HEMIARTHROPLASTY);  Surgeon: Christena FlakeJohn J Kelita Wallis, MD;  Location: [facility] ORS;  Service: Orthopedics;  Laterality: Left;   Social History   Social History  . Marital status: Married    Spouse name: N/A  . Number of children: N/A  . Years of education: N/A   Social History Main Topics  . Smoking status: Former Smoker    Packs/day: 1.00    Years: 60.00    Types: Cigarettes    Quit date: 11/24/2003  . Smokeless tobacco: Never Used  . Alcohol use No     Comment: occasional beer  . Drug use: No  . Sexual activity: Not Asked   Other Topics Concern  . None   Social History Narrative  . None   Family History  Problem Relation Age of Onset  . Cancer Father   . Stroke Father    No Known Allergies Prior to Admission medications   Medication Sig Start Date End Date Taking? Authorizing Provider  acetaminophen (TYLENOL) 500 MG tablet Take 500 mg by mouth every 6 (six) hours  as needed.   Yes Historical Provider, MD  acidophilus (RISAQUAD) CAPS capsule Take by mouth daily.   Yes Historical Provider, MD  ADVAIR DISKUS 250-50 MCG/DOSE AEPB Take 1 puff by mouth 2 (two) times daily. 01/26/15  Yes Historical Provider, MD  albuterol (PROAIR HFA) 108 (90 Base) MCG/ACT inhaler Inhale 2 puffs into the lungs every 6 (six) hours as needed. 03/26/14  Yes Historical Provider, MD  Cholecalciferol (VITAMIN D3) 2000 units capsule Take 1 capsule by mouth daily.   Yes Historical Provider, MD  docusate sodium (COLACE) 100  MG capsule Take 1 capsule (100 mg total) by mouth 2 (two) times daily. 02/13/16  Yes Sital Mody, MD  donepezil (ARICEPT) 5 MG tablet Take 5 mg by mouth at bedtime.   Yes Historical Provider, MD  ferrous sulfate 325 (65 FE) MG tablet Take 1 tablet (325 mg total) by mouth 3 (three) times daily after meals. 02/13/16  Yes Sital Mody, MD  fluticasone (FLONASE) 50 MCG/ACT nasal spray Place 2 sprays into both nostrils daily.  01/12/15  Yes Historical Provider, MD  HYDROcodone-acetaminophen (NORCO/VICODIN) 5-325 MG tablet Take 1-2 tablets by mouth every 4 (four) hours as needed for moderate pain. 02/13/16  Yes Adrian Saran, MD  Insulin Detemir (LEVEMIR FLEXTOUCH) 100 UNIT/ML Pen Inject 3 Units into the skin daily. 02/13/16  Yes Sital Mody, MD  lisinopril (PRINIVIL,ZESTRIL) 10 MG tablet Take 10 mg by mouth daily. 03/25/16  Yes Historical Provider, MD  loperamide (IMODIUM) 2 MG capsule Take 2 mg by mouth as needed for diarrhea or loose stools.   Yes Historical Provider, MD  Melatonin 3 MG TABS Take 1 tablet by mouth at bedtime. 02/25/15  Yes Historical Provider, MD  metoprolol (LOPRESSOR) 50 MG tablet Take 1 tablet (50 mg total) by mouth 2 (two) times daily. 02/13/16  Yes Adrian Saran, MD  oxyCODONE-acetaminophen (PERCOCET) 5-325 MG tablet Take 2 tablets by mouth every 6 (six) hours as needed for moderate pain or severe pain. 04/02/16  Yes Emily Filbert, MD  amLODipine (NORVASC) 10 MG tablet Take 1 tablet (10 mg total) by mouth daily. Patient not taking: Reported on 04/02/2016 02/13/16   Adrian Saran, MD  enoxaparin (LOVENOX) 30 MG/0.3ML injection Inject 0.3 mLs (30 mg total) into the skin daily. 02/13/16 02/27/16  Adrian Saran, MD   Ct Hip Left Wo Contrast  Result Date: 04/03/2016 CLINICAL DATA:  Left hip prosthesis, dislocation yesterday with reduction, continued pain. EXAM: CT OF THE LEFT HIP WITHOUT CONTRAST TECHNIQUE: Multidetector CT imaging of the left hip was performed according to the standard protocol.  Multiplanar CT image reconstructions were also generated. COMPARISON:  01/31/2017 FINDINGS: Bones/Joint/Cartilage No convincing fracture. Faint linear bands of calcification along the anterior cortical margin of the proximal femur without cortical displacement, this is probably chronic. Spurring along the acetabulum without an acetabular fracture. No lucency around the stem portion of the implant. No lower sacral or coccygeal fracture is seen. Ligaments Suboptimally assessed by CT. Muscles and Tendons Prominent piriformis hematoma measuring about 13.7 by 4.2 cm on image 1/4, this extends with the muscle through the sciatic notch. The hematoma also appears to extend down along the posterior portion of the operator internus and into the quadratus femoris and gluteus maximus with associated edema in these muscles and associated considerable subcutaneous edema in the region. The sciatic nerve is not impinged upon at the sciatic notch but is displaced by hematoma below the level of the sciatic notch. Nonspecific presacral edema along the lower sacrum. Suspected edema  in the gluteus medius potentially with trochanteric bursitis. Soft tissues Extensive subcutaneous edema lateral and posterior to the left hip. IMPRESSION: 1. Considerable hematoma extending within and along the left piriformis, operator internus, quadratus femoris, and gluteus maximus muscles. The piriformis component of the hematoma extends within the muscle through the sciatic notch region, with both intrapelvic and extrapelvic components. Although there is no direct sciatic notch impingement on the sciatic nerve, the sciatic nerve is displaced by hematoma just below the level of the sciatic notch. 2. Edema in the gluteus medius with adjacent trochanteric bursitis. 3. Nonspecific presacral edema. 4. No convincing fracture. There is some calcification along the anterior periosteal margin of the left proximal femur which is probably incidental.  Electronically Signed   By: Gaylyn Rong M.D.   On: 04/03/2016 12:06   Dg Hip Unilat W Or W/o Pelvis 2-3 Views Left  Result Date: 04/02/2016 CLINICAL DATA:  Status post left hip reduction. EXAM: DG HIP (WITH OR WITHOUT PELVIS) 2-3V LEFT COMPARISON:  Earlier the same day. FINDINGS: A single frontal view of the left hip shows interval reduction of the femoral component dislocation seen previously. No evidence for periprosthetic fracture. Bones are diffusely demineralized. IMPRESSION: Left hip dislocation seen previously has been reduced in the interval. Electronically Signed   By: Kennith Center M.D.   On: 04/02/2016 20:15   Dg Hip Unilat W Or W/o Pelvis 2-3 Views Left  Result Date: 04/02/2016 CLINICAL DATA:  Pt. Had fall at [facility]. Pt. Has pain to lt. Hip. EMS reports hx of fracture to lt. Hip. Pt. Denies LOC. Pt. Has skin tear to lt. Forearm. EXAM: DG HIP (WITH OR WITHOUT PELVIS) 2-3V LEFT COMPARISON:  02/10/2016 FINDINGS: The left hip prosthesis has dislocated superior to the acetabulum. There is no convincing fracture. The prosthetic component appears well seated. SI joints, symphysis pubis and right hip joint are normally spaced and aligned. Bones are diffusely demineralized. There are dense aortoiliac and femoral vessel atherosclerotic calcifications. A bowel anastomosis staple line lies in the central pelvis. IMPRESSION: Dislocated left hip prosthesis.  No convincing fracture. Electronically Signed   By: Amie Portland M.D.   On: 04/02/2016 18:35    Positive ROS: All other systems have been reviewed and were otherwise negative with the exception of those mentioned in the HPI and as above.  Physical Exam: General:  Alert, no acute distress Psychiatric:  Patient is alert and responsive with normal mood and affect   Cardiovascular:  No pedal edema Respiratory:  No wheezing, non-labored breathing GI:  Abdomen is soft and non-tender Skin:  No lesions in the area of chief  complaint Neurologic:  Sensation intact distally Lymphatic:  No axillary or cervical lymphadenopathy  Orthopedic Exam:  Orthopedic examination is limited to the left hip and lower extremity. The left lower extremity appears to be out to length as compared to the right lower extremity. Presently, it is in the immobilizer. Inspection of the left hip is notable for a well-healed surgical incision without evidence for infection. There is mild-moderate swelling around the hip area, but no other skin violations are noted. He has mild tenderness to palpation over the lateral hip region. He is able to tolerate gentle log rolling without pain. He is neurovascularly intact to the right lower extremity and foot.  X-rays:  Recent pre-and postreduction views of the left hip are reviewed. It appears that there was a posterior dislocation of the hip which was well reduced in the emergency room. A subsequent CT scan  of the pelvis and left hip the following day shows that the hip is concentrically located. No fractures of the acetabulum or proximal femur are noted.  Assessment: Status post closed reduction of posterior dislocation status post prior left hip hemiarthroplasty.  Plan: The treatment options are discussed with the patient. At this point, the hip is well located so no additional formal treatment is necessary. The patient is able to be mobilized with physical therapy, weightbearing as tolerated on the left lower extremity with a walker for balance and support. He is to remain in his knee immobilizer at all times, both during ambulation as well as when he is in bed.  Thank you for asking me to participate in the care of this most pleasant man. I will be happy to follow him with you.   Maryagnes Amos, MD  Beeper #:  [phone]  04/04/2016 4:49 PM

## 2016-04-05 ENCOUNTER — Encounter: Payer: Self-pay | Admitting: General Practice

## 2016-04-05 LAB — CBC
HEMATOCRIT: 24.7 % — AB (ref 40.0–52.0)
Hemoglobin: 8.3 g/dL — ABNORMAL LOW (ref 13.0–18.0)
MCH: 32.1 pg (ref 26.0–34.0)
MCHC: 33.6 g/dL (ref 32.0–36.0)
MCV: 95.5 fL (ref 80.0–100.0)
Platelets: 169 10*3/uL (ref 150–440)
RBC: 2.58 MIL/uL — AB (ref 4.40–5.90)
RDW: 15.3 % — ABNORMAL HIGH (ref 11.5–14.5)
WBC: 6.2 10*3/uL (ref 3.8–10.6)

## 2016-04-05 LAB — TYPE AND SCREEN
ABO/RH(D): O POS
Antibody Screen: NEGATIVE
UNIT DIVISION: 0

## 2016-04-05 LAB — GLUCOSE, CAPILLARY
GLUCOSE-CAPILLARY: 270 mg/dL — AB (ref 65–99)
Glucose-Capillary: 117 mg/dL — ABNORMAL HIGH (ref 65–99)
Glucose-Capillary: 128 mg/dL — ABNORMAL HIGH (ref 65–99)
Glucose-Capillary: 143 mg/dL — ABNORMAL HIGH (ref 65–99)

## 2016-04-05 MED ORDER — HALOPERIDOL LACTATE 5 MG/ML IJ SOLN
2.5000 mg | Freq: Once | INTRAMUSCULAR | Status: AC
  Administered 2016-04-05: 2.5 mg via INTRAMUSCULAR
  Filled 2016-04-05: qty 1

## 2016-04-05 MED ORDER — HALOPERIDOL 0.5 MG PO TABS: 2.5000 mg | ORAL_TABLET | Freq: Once | ORAL | Status: DC

## 2016-04-05 NOTE — Progress Notes (Signed)
Patient severely combative, refusing medication, pulled both IV access. Paged MD, new order for 2.5mg  Haldol once. Will continue to monitor.

## 2016-04-05 NOTE — Progress Notes (Signed)
Patient continues to refuse medication. Conversation with MD ok to hold medication r/t 116/78, HR 58. Will continue to monitor. Alerted oncoming nurse

## 2016-04-05 NOTE — Progress Notes (Signed)
Subjective: No new complaints pertaining to left hip. Patient appears depressed and not very cooperative.   Objective: Vital signs in last 24 hours: Temp:  [97.3 F (36.3 C)-98.2 F (36.8 C)] 98.2 F (36.8 C) (01/16 1541) Pulse Rate:  [54-72] 66 (01/16 1541) Resp:  [18-19] 18 (01/16 1541) BP: (116-209)/(39-78) 153/39 (01/16 1541) SpO2:  [92 %-99 %] 93 % (01/16 1541)  Intake/Output from previous day: 01/15 0701 - 01/16 0700 In: 2234 [P.O.:360; I.V.:1200; Blood:674] Out: -  Intake/Output this shift: Total I/O In: 240 [P.O.:240] Out: -    Recent Labs  04/03/16 1153 04/03/16 1809 04/04/16 1006 04/05/16 0511  HGB 7.7* 7.9* 6.9* 8.3*    Recent Labs  04/04/16 1006 04/05/16 0511  WBC 6.2 6.2  RBC 2.11* 2.58*  HCT 20.5* 24.7*  PLT 172 169    Recent Labs  04/03/16 1153 04/04/16 1006  NA 141 139  K 5.0 4.6  CL 107 108  CO2 28 23  BUN 49* 42*  CREATININE 2.60* 2.36*  GLUCOSE 77 101*  CALCIUM 8.3* 8.0*    Recent Labs  04/03/16 1312  INR 1.01    Physical Exam: Examination of his left hip and lower extremity is unchanged as compared to yesterday. He remains neurovascularly intact to the right lower extremity and foot.  Assessment: Status post prosthetic dislocation left hip.  Plan: Please continue to mobilize patient as symptoms permit and maintain knee immobilizer at all times. He may weight-bear as tolerated on the left lower extremity with the immobilizer on.   Patrick SeltzerJohn Morrison [name] 04/05/2016, 6:28 PM

## 2016-04-05 NOTE — Clinical Social Work Note (Signed)
Clinical Social Work Assessment  Patient Details  Name: Patrick Morrison MRN: [id number] Date of Birth: 1934-02-22  Date of referral:  04/05/16               Reason for consult:  Facility Placement                Permission sought to share information with:  Chartered certified accountant granted to share information::  Yes, Verbal Permission Granted  Name::      [place]::   [facility])  Relationship::     Contact Information:     Housing/Transportation Living arrangements for the past 2 months:  [place] ([facility]) Source of Information:  Spouse, Facility Patient Interpreter Needed:  None Criminal Activity/Legal Involvement Pertinent to Current Situation/Hospitalization:  No - Comment as needed Significant Relationships:  Adult Children, Spouse Lives with:  Facility Resident ([facility]) Do you feel safe going back to the place where you live?  Yes Need for family participation in patient care:  No ([place])  Care giving concerns:  Patient is a resident at [facility].    Social Worker assessment / plan:  Social work Theatre manager received social work consult. PT has not worked with patient at this time. Social work Theatre manager met with patient at bedside. Social work Theatre manager introduced herself and explained role of social work department. Patient seemed confused and was not answering the questions the social work intern asked. Social work Theatre manager spoke to patients wife Patrick Morrison and the Marine scientist at [employer]. Per patient's wife, patient has lived at [facility] for about a month now. Patient's wife is also patient's HPOA. Social work Theatre manager explained that PT will work with patient and determine if he needs rehab. Social work Theatre manager explained to wife that patient needs a higher level of care due to current behavior and a SNF search would have to placed. Patient wife was agreeable to SNF search. Patient's wife is looking at  [facility] or [place] and is refusing to send patient to [facility] or [facility]. Patient's wife is to call social work Theatre manager when she has decided on a SNF.   Social work Theatre manager spoke to a Marine scientist from [place]. Nanine Means confirmed that patient has been a resident for about a month now. According to the nurse, patient has a walker but does not always walk with it and is on continuous oxygen. In order for patient to come back, Nanine Means would have to come reassess patient. Social work Solicitor ([place]) will keep facility and patient's wife updated on patient's status. Social work Theatre manager will continue to assist as needed.   Fl2 completed and faxed out.   Employment status:  Unemployed Nurse, adult PT Recommendations:  Not assessed at this time Information / Referral to community resources:  [place]  Patient/Family's Response to care:  Patient's wife is agreeable to [facility] and is looking at [facility].   Patient/Family's Understanding of and Emotional Response to Diagnosis, Current Treatment, and Prognosis:  Patient's wife and nurse from [place] were both pleasant and thanked social work Theatre manager for calling.   Emotional Assessment Appearance:  Appears stated age Attitude/Demeanor/Rapport:    Affect (typically observed):  Accepting, Adaptable, Appropriate Orientation:  Oriented to Self, Oriented to Place, Oriented to  Time, Oriented to Situation Alcohol / Substance use:  Not Applicable Psych involvement (Current and /or in the community):  No (  Comment)  Discharge Needs  Concerns to be addressed:  Basic Needs, Discharge Planning Concerns Readmission within the last 30 days:  No Current discharge risk:  Dependent with Mobility Barriers to Discharge:  Continued Medical Work up   [employer], [place] Work 04/05/2016, 3:44 PM

## 2016-04-05 NOTE — Progress Notes (Signed)
[facility] [place] [facility]   PATIENT NAME: Patrick ColaceWendell Armentor    MR#:  [id number]  DATE OF BIRTH:  Sep 26, 1933  SUBJECTIVE:   Pt. Here after a fall and noted to have Left hip Hematoma.  Hg improved post transfusion. Agitated this a.m. And refusing meds and pulled out IV.  Given some Haldol.   REVIEW OF SYSTEMS:    Review of Systems  Unable to perform ROS: Mental acuity    Nutrition: Heart Healthy/Carb modified Tolerating Diet: Yes but very little Tolerating PT:  Await eval.    DRUG ALLERGIES:  No Known Allergies  VITALS:  Blood pressure (!) 153/39, pulse 66, temperature 98.2 F (36.8 C), temperature source Oral, resp. rate 18, height 5\' 6"  (1.676 m), weight 57.7 kg (127 lb 4.8 oz), SpO2 93 %.  PHYSICAL EXAMINATION:   Physical Exam  GENERAL:  83 y.o.-year-old cachectic patient lying in bed in no acute distress.  EYES: Pupils equal, round, reactive to light and accommodation. No scleral icterus. Extraocular muscles intact.  HEENT: Head atraumatic, normocephalic. Oropharynx and nasopharynx clear.  NECK:  Supple, no jugular venous distention. No thyroid enlargement, no tenderness.  LUNGS: Normal breath sounds bilaterally, no wheezing, rales, rhonchi. No use of accessory muscles of respiration.  CARDIOVASCULAR: S1, S2 normal. No murmurs, rubs, or gallops.  ABDOMEN: Soft, nontender, nondistended. Bowel sounds present. No organomegaly or mass.  EXTREMITIES: No cyanosis, clubbing or edema b/l. Left left splint in place.    NEUROLOGIC: Cranial nerves II through XII are intact. No focal Motor or sensory deficits b/l. Globally weak  PSYCHIATRIC: The patient is alert and oriented x 1.  SKIN: No obvious rash, lesion, or ulcer.    LABORATORY PANEL:   CBC  Recent Labs Lab 04/05/16 0511  WBC 6.2  HGB 8.3*  HCT 24.7*  PLT 169   ------------------------------------------------------------------------------------------------------------------  Chemistries    Recent Labs Lab 04/02/16 1749  04/04/16 1006  NA 138  < > 139  K 3.8  < > 4.6  CL 103  < > 108  CO2 27  < > 23  GLUCOSE 174*  < > 101*  BUN 43*  < > 42*  CREATININE 2.20*  < > 2.36*  CALCIUM 8.2*  < > 8.0*  AST 19  --   --   ALT 11*  --   --   ALKPHOS 79  --   --   BILITOT 0.3  --   --   < > = values in this interval not displayed. ------------------------------------------------------------------------------------------------------------------  Cardiac Enzymes  Recent Labs Lab 04/02/16 1749  TROPONINI <0.03   ------------------------------------------------------------------------------------------------------------------  RADIOLOGY:  No results found.   ASSESSMENT AND PLAN:   83 year old male with past medical history of dementia, hypertension, hyperlipidemia, diabetes, COPD, BPH, history of osteoarthritis who presented to the hospital after a fall and noted to have a left hip hematoma.  1. Left hip hematoma-secondary to a traumatic fall. This was noted on his CT scan on admission. - Hg. Improved post transfusion from 6.9 to 8.3 today.   - appreciate Ortho input and no need for surgical intervention as no fracture noted.  -Continue supportive care with pain control, follow serial hemoglobin.  - cont. Supportive care for now.   2. Acute blood loss anemia - secondary on Hematoma.  - Hg. Improved post transfusion and will monitor.   3. DM type II - cont. Lantus, SSI.   - follow BS.    4. HTN - cont. Metoprolol, Lisinopril.  5. COPD - no acute exacerbation.  - cont. Dulera, albuterol nebs as needed  6. CKD Stage III - Cr. Close to baseline and will cont. To monitor.  - renal dose meds.   7. Dementia - cont. Aricept.  - given some Haldol for agitation.    All the records are reviewed and case discussed with Care Management/Social Worker. Management plans discussed with the patient, family and they are in agreement.  CODE STATUS: DNR  DVT Prophylaxis:  Ted's and SCD's.   TOTAL TIME TAKING CARE OF THIS PATIENT: 30 minutes.   POSSIBLE D/C IN 1-2 DAYS, DEPENDING ON CLINICAL CONDITION.   Houston Siren M.D on 04/05/2016 at 4:52 PM  Between 7am to 6pm - Pager - [phone]  After 6pm go to [email or web address] - Social research officer, government  [employer] [facility]  Office  [phone]  CC: Primary care physician; Lynnea Ferrier, MD

## 2016-04-05 NOTE — Clinical Social Work Placement (Signed)
   CLINICAL SOCIAL WORK PLACEMENT  NOTE  Date:  04/05/2016  Patient Details  Name: Patrick Morrison MRN: [id number] Date of Birth: October 27, 1933  Clinical Social Work is seeking post-discharge placement for this patient at the Skilled  Nursing Facility level of care (*CSW will initial, date and re-position this form in  chart as items are completed):  Yes   Patient/family provided with [place] Clinical Social Work Department's list of facilities offering this level of care within the geographic area requested by the patient (or if unable, by the patient's family).  Yes   Patient/family informed of their freedom to choose among providers that offer the needed level of care, that participate in Medicare, Medicaid or managed care program needed by the patient, have an available bed and are willing to accept the patient.  Yes   Patient/family informed of [place]'s ownership interest in [facility] and [facility], as well as of the fact that they are under no obligation to receive care at these facilities.  PASRR submitted to EDS on       PASRR number received on       Existing PASRR number confirmed on 04/05/16     FL2 transmitted to all facilities in geographic area requested by pt/family on 04/05/16     FL2 transmitted to all facilities within larger geographic area on       Patient informed that his/her managed care company has contracts with or will negotiate with certain facilities, including the following:        Yes   Patient/family informed of bed offers received.  Patient chooses bed at       Physician recommends and patient chooses bed at      Patient to be transferred to   on  .  Patient to be transferred to facility by       Patient family notified on   of transfer.  Name of family member notified:        PHYSICIAN       Additional Comment:    _______________________________________________ Patrick Morrison, Patrick CrockerBailey M, LCSW 04/05/2016, 3:50 PM

## 2016-04-05 NOTE — Progress Notes (Signed)
Inpatient Diabetes Program Recommendations  AACE/ADA: New Consensus Statement on Inpatient Glycemic Control (2015)  Target Ranges:  Prepandial:   less than 140 mg/dL      Peak postprandial:   less than 180 mg/dL (1-2 hours)      Critically ill patients:  140 - 180 mg/dL   Results for Patrick Morrison, Patrick Morrison (MRN [id number]) as of 04/05/2016 10:56  Ref. Range 04/04/2016 07:33 04/04/2016 12:07 04/04/2016 16:10 04/04/2016 19:54 04/05/2016 08:36  Glucose-Capillary Latest Ref Range: 65 - 99 mg/dL 99 96 64 (L) 97 130117 (H)   Review of Glycemic Control  Diabetes history: DM2 Outpatient Diabetes medications: Levemir 3 units daily Current orders for Inpatient glycemic control: Levemir 3 units daily, Novolog 0-9 units TID with meals, Novolog 0-5 units QHS  Inpatient Diabetes Program Recommendations: Insulin - Basal: Glucose down to 64 mg/dl on 8/65/781/15/17 and fasting glucose 117 mg/dl today. Patient received Levemir 3 units on 04/04/16. Please discontinued Levemir 3 units daily and just use Novolog correction scale for inpatient glycemic control at this time.  Thanks, Orlando PennerMarie Danta Baumgardner, RN, MSN, CDE Diabetes Coordinator Inpatient Diabetes Program [phone] (Team Pager from 8am to 5pm)

## 2016-04-06 LAB — GLUCOSE, CAPILLARY
GLUCOSE-CAPILLARY: 149 mg/dL — AB (ref 65–99)
GLUCOSE-CAPILLARY: 187 mg/dL — AB (ref 65–99)
Glucose-Capillary: 124 mg/dL — ABNORMAL HIGH (ref 65–99)
Glucose-Capillary: 258 mg/dL — ABNORMAL HIGH (ref 65–99)

## 2016-04-06 MED ORDER — HYDRALAZINE HCL 20 MG/ML IJ SOLN: 10.0000 mg | Freq: Four times a day (QID) | INTRAMUSCULAR | Status: DC | PRN

## 2016-04-06 MED ORDER — METOPROLOL TARTRATE 50 MG PO TABS
75.0000 mg | ORAL_TABLET | Freq: Two times a day (BID) | ORAL | Status: DC
  Administered 2016-04-06 – 2016-04-08 (×5): 75 mg via ORAL
  Filled 2016-04-06 (×6): qty 1

## 2016-04-06 NOTE — Progress Notes (Signed)
[facility] [place] [facility]   PATIENT NAME: Patrick ColaceWendell Ratti    MR#:  [id number]  DATE OF BIRTH:  19-Mar-1934  SUBJECTIVE:   Pt. Here after a fall and noted to have Left hip Hematoma.  Hg improved post transfusion.The patient was agitated yesterday and given some Haldol.  He is quite today, but refused hypertension medication this morning. He finally took hypertension medication. His blood pressure is elevated at 220/78.  REVIEW OF SYSTEMS:    Review of Systems  Unable to perform ROS: Mental acuity    Nutrition: Heart Healthy/Carb modified Tolerating Diet: Yes but very little Tolerating PT:  Await eval.    DRUG ALLERGIES:  No Known Allergies  VITALS:  Blood pressure (!) 179/84, pulse 69, temperature 98.2 F (36.8 C), temperature source Oral, resp. rate 16, height 5\' 6"  (1.676 m), weight 127 lb 4.8 oz (57.7 kg), SpO2 100 %.  PHYSICAL EXAMINATION:   Physical Exam  GENERAL:  83 y.o.-year-old cachectic patient lying in bed in no acute distress.  EYES: Pupils equal, round, reactive to light and accommodation. No scleral icterus. Extraocular muscles intact.  HEENT: Head atraumatic, normocephalic. Oropharynx and nasopharynx clear.  NECK:  Supple, no jugular venous distention. No thyroid enlargement, no tenderness.  LUNGS: Normal breath sounds bilaterally, no wheezing, rales, rhonchi. No use of accessory muscles of respiration.  CARDIOVASCULAR: S1, S2 normal. No murmurs, rubs, or gallops.  ABDOMEN: Soft, nontender, nondistended. Bowel sounds present. No organomegaly or mass.  EXTREMITIES: No cyanosis, clubbing or edema b/l. Left left splint in place.    NEUROLOGIC: follow commands.  PSYCHIATRIC: The patient is confused and demented. SKIN: No obvious rash, lesion, or ulcer.    LABORATORY PANEL:   CBC  Recent Labs Lab 04/05/16 0511  WBC 6.2  HGB 8.3*  HCT 24.7*  PLT 169    ------------------------------------------------------------------------------------------------------------------  Chemistries   Recent Labs Lab 04/02/16 1749  04/04/16 1006  NA 138  < > 139  K 3.8  < > 4.6  CL 103  < > 108  CO2 27  < > 23  GLUCOSE 174*  < > 101*  BUN 43*  < > 42*  CREATININE 2.20*  < > 2.36*  CALCIUM 8.2*  < > 8.0*  AST 19  --   --   ALT 11*  --   --   ALKPHOS 79  --   --   BILITOT 0.3  --   --   < > = values in this interval not displayed. ------------------------------------------------------------------------------------------------------------------  Cardiac Enzymes  Recent Labs Lab 04/02/16 1749  TROPONINI <0.03   ------------------------------------------------------------------------------------------------------------------  RADIOLOGY:  No results found.   ASSESSMENT AND PLAN:   83 year old male with past medical history of dementia, hypertension, hyperlipidemia, diabetes, COPD, BPH, history of osteoarthritis who presented to the hospital after a fall and noted to have a left hip hematoma.  1. Left hip hematoma-secondary to a traumatic fall. This was noted on his CT scan on admission. - Hg. Improved post transfusion from 6.9 to 8.3.Marland Kitchen.   - appreciate Ortho input and no need for surgical intervention as no fracture noted.  -Continue supportive care with pain control, follow serial hemoglobin.  - cont. Supportive care for now.   2. Acute blood loss anemia - secondary on Hematoma.  - Hg. Improved post transfusion and follow-up hemoglobin.  3. DM type II - cont. Lantus, SSI.   - follow BS.    4. HTN malignancy. Start IV hydralazine, increased Metoprolol, continue  Lisinopril.   5. COPD - no acute exacerbation.  - cont. Dulera, albuterol nebs as needed  6. CKD Stage III - Cr. Close to baseline. - renal dose meds.   7. Dementia - cont. Aricept.  - given some Haldol for agitation.    All the records are reviewed and case discussed  with Care Management/Social Worker. Management plans discussed with the patient, family and they are in agreement.  CODE STATUS: DNR  DVT Prophylaxis: Ted's and SCD's.   TOTAL TIME TAKING CARE OF THIS PATIENT: 37 minutes.   POSSIBLE D/C IN 1-2 DAYS, DEPENDING ON CLINICAL CONDITION.   Shaune Pollack M.D on 04/06/2016 at 4:01 PM  Between 7am to 6pm - Pager - [phone]  After 6pm go to [email or web address] - Social research officer, government  [employer] [facility]  Office  [phone]  CC: Primary care physician; Lynnea Ferrier, MD

## 2016-04-06 NOTE — Progress Notes (Addendum)
Clinical Social Worker (CSW) contacted patient's wife Corrie DandyMary to get SNF choice. Wife did not answer and a voicemail was left.   Wife called CSW back and chose [facility]. [facility] are [facility] and will have to see if they have a bed when patient is ready for D/C.    Baker Hughes IncorporatedBailey Mcdonald Reiling, LCSW [phone]

## 2016-04-07 LAB — GLUCOSE, CAPILLARY
GLUCOSE-CAPILLARY: 176 mg/dL — AB (ref 65–99)
Glucose-Capillary: 142 mg/dL — ABNORMAL HIGH (ref 65–99)
Glucose-Capillary: 272 mg/dL — ABNORMAL HIGH (ref 65–99)
Glucose-Capillary: 229 mg/dL — ABNORMAL HIGH (ref 65–99)
Glucose-Capillary: 84 mg/dL (ref 65–99)
Glucose-Capillary: 148 mg/dL — ABNORMAL HIGH (ref 65–99)

## 2016-04-07 LAB — HEMOGLOBIN: Hemoglobin: 8.2 g/dL — ABNORMAL LOW (ref 13.0–18.0)

## 2016-04-07 MED ORDER — AMLODIPINE BESYLATE 10 MG PO TABS
10.0000 mg | ORAL_TABLET | Freq: Every day | ORAL | Status: DC
  Administered 2016-04-07 – 2016-04-08 (×2): 10 mg via ORAL
  Filled 2016-04-07 (×2): qty 1

## 2016-04-07 NOTE — Evaluation (Signed)
Physical Therapy Evaluation Patient Details Name: Patrick Morrison E Gassner MRN: [id number] DOB: Apr 29, 1933 Today's Date: 04/07/2016   History of Present Illness  83 y.o. male with a known history of Hypertension, diabetes, COPD, bilateral carotid artery stenosis and BPH. The patient was found yesterday and was diagnosed with the left hip dislocation.  No fracture/surgery needed, KI donned.  Clinical Impression  Pt is weak and limited with mobility and ambulation but generally was able to show good effort.  He was confused and though he was able to follow along with basic instructions generally he needed constant cuing for safety.      Follow Up Recommendations SNF    Equipment Recommendations       Recommendations for Other Services       Precautions / Restrictions Precautions Precautions: Fall Restrictions Weight Bearing Restrictions: No      Mobility  Bed Mobility Overal bed mobility: Needs Assistance Bed Mobility: Supine to Sit     Supine to sit: Min assist     General bed mobility comments: Pt able to get himself part of the way up w/o assist, but is unable to get L LE off EOB and did need assist to get himself to EOB  Transfers Overall transfer level: Needs assistance Equipment used: Rolling walker (2 wheeled) Transfers: Sit to/from Stand Sit to Stand: Min assist         General transfer comment: Pt is generally weak and limited but able to maintain standing balance w/ walker  Ambulation/Gait Ambulation/Gait assistance: Min assist Ambulation Distance (Feet): 65 Feet Assistive device: Rolling walker (2 wheeled)       General Gait Details: Pt is able to ambulate with very slow, but relatively safe cadence.  KI on L LE the entire time, he needed a lot of cuing and instruction for directions and general safety/awareness  Stairs            Wheelchair Mobility    Modified Rankin (Stroke Patients Only)       Balance Overall balance assessment: Modified  Independent                                           Pertinent Vitals/Pain Pain Assessment:  (unable to rate, indicates only minimal pain with mobility)    Home Living Family/patient expects to be discharged to:: Skilled nursing facility                 Additional Comments: Pt from assisted living facility?  Reports he lives with wife and child...?    Prior Function           Comments: Pt reports being independent and using SPC vs RW for ambulation; Pt also appearing to be poor historian but no family present to confirm PLOF and home set-up     Hand Dominance        Extremity/Trunk Assessment   Upper Extremity Assessment Upper Extremity Assessment: Difficult to assess due to impaired cognition    Lower Extremity Assessment Lower Extremity Assessment: Difficult to assess due to impaired cognition       Communication   Communication: No difficulties  Cognition Arousal/Alertness: Awake/alert Behavior During Therapy: Restless;Impulsive Overall Cognitive Status: History of cognitive impairments - at baseline                      General Comments  Exercises     Assessment/Plan    PT Assessment Patient needs continued PT services  PT Problem List Decreased balance;Decreased strength;Decreased safety awareness;Decreased cognition;Decreased mobility;Decreased activity tolerance;Decreased range of motion;Decreased coordination;Decreased knowledge of use of DME;Decreased knowledge of precautions          PT Treatment Interventions DME instruction;Gait training;Stair training;Functional mobility training;Therapeutic exercise;Balance training;Therapeutic activities;Cognitive remediation;Patient/family education    PT Goals (Current goals can be found in the Care Plan section)  Acute Rehab PT Goals PT Goal Formulation: Patient unable to participate in goal setting Time For Goal Achievement: 04/21/16 Potential to Achieve Goals:  Fair    Frequency Min 2X/week   Barriers to discharge        Co-evaluation               End of Session Equipment Utilized During Treatment: Gait belt Activity Tolerance:  (confusion biggest limiter) Patient left: with chair alarm set;with call bell/phone within reach      Functional Assessment Tool Used: clinical judgement Functional Limitation: Mobility: Walking and moving around Mobility: Walking and Moving Around Current Status [phone]): At least 40 percent but less than 60 percent impaired, limited or restricted Mobility: Walking and Moving Around Goal Status [phone]): At least 20 percent but less than 40 percent impaired, limited or restricted    Time: 1041-1057 PT Time Calculation (min) (ACUTE ONLY): 16 min   Charges:   PT Evaluation $PT Eval Low Complexity: 1 Procedure     PT G Codes:   PT G-Codes **NOT FOR INPATIENT CLASS** Functional Assessment Tool Used: clinical judgement Functional Limitation: Mobility: Walking and moving around Mobility: Walking and Moving Around Current Status ([id number]): At least 40 percent but less than 60 percent impaired, limited or restricted Mobility: Walking and Moving Around Goal Status [phone]): At least 20 percent but less than 40 percent impaired, limited or restricted    Malachi Pro, DPT 04/07/2016, 12:12 PM

## 2016-04-07 NOTE — Discharge Summary (Signed)
[facility] - [place] at [facility]   PATIENT NAME: Patrick Morrison    MR#:  [id number]  DATE OF BIRTH:  23-Mar-1933  DATE OF ADMISSION:  04/03/2016   ADMITTING PHYSICIAN: Shaune Pollack, MD  DATE OF DISCHARGE: No discharge date for patient encounter.  PRIMARY CARE PHYSICIAN: Curtis Sites III, MD   ADMISSION DIAGNOSIS:  Hematoma [T14.8XXA] DISCHARGE DIAGNOSIS:  Active Problems:   Hip hematoma, left, initial encounter  SECONDARY DIAGNOSIS:   Past Medical History:  Diagnosis Date  . Abnormal echocardiogram   . Asymptomatic bilateral carotid artery stenosis   . Bilateral knee pain   . BPH (benign prostatic hyperplasia)   . Bruit of right carotid artery   . Chronic rhinitis   . COPD (chronic obstructive pulmonary disease) (HCC)   . Diabetic macular edema (HCC)   . DM (diabetes mellitus) (HCC)   . Dysplasia of prostate   . Edema   . Hyperlipidemia   . Hypertension   . NPDR (nonproliferative diabetic retinopathy) (HCC)   . Osteoarthritis   . Personal history of other malignant neoplasm of skin   . Primary osteoarthritis of both knees   . Pseudophakia of both eyes    HOSPITAL COURSE:   83 year old male with past medical history of dementia, hypertension, hyperlipidemia, diabetes, COPD, BPH, history of osteoarthritis who presented to the hospital after a fall and noted to have a left hip hematoma.  1. Left hip hematoma-secondary to a traumatic fall. This was noted on his CT scan on admission. - Hg. Improved post transfusion from 6.9 to 8.3.  - appreciate Ortho input and no need for surgical intervention as no fracture noted.  -Continue supportive care with pain control, follow serial hemoglobin is stable.  - cont. Supportive care for now.   2. Acute blood loss anemia - secondary on Hematoma.  - Hg. Improved post transfusion and hemoglobin is stable.  3. DM type II - cont. Lantus, SSI.   - follow BS.    4. HTN malignancy. on IV hydralazine, increased  Metoprolol, continue Lisinopril.   5. COPD - no acute exacerbation.  - cont. Dulera, albuterol nebs as needed  6. CKD Stage III - Cr. Close to baseline.  - renal dose meds.   7. Dementia - cont. Aricept.  - given some Haldol for agitation.    DISCHARGE CONDITIONS:  Stable, discharge to SNF today. CONSULTS OBTAINED:  Treatment Team:  Christena Flake, MD DRUG ALLERGIES:  No Known Allergies DISCHARGE MEDICATIONS:   Allergies as of 04/07/2016   No Known Allergies     Medication List    STOP taking these medications   enoxaparin 30 MG/0.3ML injection Commonly known as:  LOVENOX   HYDROcodone-acetaminophen 5-325 MG tablet Commonly known as:  NORCO/VICODIN   oxyCODONE-acetaminophen 5-325 MG tablet Commonly known as:  PERCOCET     TAKE these medications   acetaminophen 500 MG tablet Commonly known as:  TYLENOL Take 500 mg by mouth every 6 (six) hours as needed.   acidophilus Caps capsule Take by mouth daily.   ADVAIR DISKUS 250-50 MCG/DOSE Aepb Generic drug:  Fluticasone-Salmeterol Take 1 puff by mouth 2 (two) times daily.   amLODipine 10 MG tablet Commonly known as:  NORVASC Take 1 tablet (10 mg total) by mouth daily.   docusate sodium 100 MG capsule Commonly known as:  COLACE Take 1 capsule (100 mg total) by mouth 2 (two) times daily.   donepezil 5 MG tablet Commonly known as:  ARICEPT Take  5 mg by mouth at bedtime.   ferrous sulfate 325 (65 FE) MG tablet Take 1 tablet (325 mg total) by mouth 3 (three) times daily after meals.   fluticasone 50 MCG/ACT nasal spray Commonly known as:  FLONASE Place 2 sprays into both nostrils daily.   Insulin Detemir 100 UNIT/ML Pen Commonly known as:  LEVEMIR FLEXTOUCH Inject 3 Units into the skin daily.   lisinopril 10 MG tablet Commonly known as:  PRINIVIL,ZESTRIL Take 10 mg by mouth daily.   loperamide 2 MG capsule Commonly known as:  IMODIUM Take 2 mg by mouth as needed for diarrhea or loose stools.     Melatonin 3 MG Tabs Take 1 tablet by mouth at bedtime.   metoprolol 50 MG tablet Commonly known as:  LOPRESSOR Take 1 tablet (50 mg total) by mouth 2 (two) times daily.   PROAIR HFA 108 (90 Base) MCG/ACT inhaler Generic drug:  albuterol Inhale 2 puffs into the lungs every 6 (six) hours as needed.   Vitamin D3 2000 units capsule Take 1 capsule by mouth daily.        DISCHARGE INSTRUCTIONS:  See AVS. If you experience worsening of your admission symptoms, develop shortness of breath, life threatening emergency, suicidal or homicidal thoughts you must seek medical attention immediately by calling 911 or calling your MD immediately  if symptoms less severe.  You Must read complete instructions/literature along with all the possible adverse reactions/side effects for all the Medicines you take and that have been prescribed to you. Take any new Medicines after you have completely understood and accpet all the possible adverse reactions/side effects.   Please note  You were cared for by a hospitalist during your hospital stay. If you have any questions about your discharge medications or the care you received while you were in the hospital after you are discharged, you can call the unit and asked to speak with the hospitalist on call if the hospitalist that took care of you is not available. Once you are discharged, your primary care physician will handle any further medical issues. Please note that NO REFILLS for any discharge medications will be authorized once you are discharged, as it is imperative that you return to your primary care physician (or establish a relationship with a primary care physician if you do not have one) for your aftercare needs so that they can reassess your need for medications and monitor your lab values.    On the day of Discharge:  VITAL SIGNS:  Blood pressure (!) 160/62, pulse 71, temperature 98 F (36.7 C), temperature source Oral, resp. rate 18, height 5'  6" (1.676 m), weight 127 lb 4.8 oz (57.7 kg), SpO2 93 %. PHYSICAL EXAMINATION:  GENERAL:  83 y.o.-year-old patient lying in the bed with no acute distress.  EYES: Pupils equal, round, reactive to light and accommodation. No scleral icterus. Extraocular muscles intact.  HEENT: Head atraumatic, normocephalic. Oropharynx and nasopharynx clear.  NECK:  Supple, no jugular venous distention. No thyroid enlargement, no tenderness.  LUNGS: Normal breath sounds bilaterally, no wheezing, rales,rhonchi or crepitation. No use of accessory muscles of respiration.  CARDIOVASCULAR: S1, S2 normal. No murmurs, rubs, or gallops.  ABDOMEN: Soft, non-tender, non-distended. Bowel sounds present. No organomegaly or mass.  EXTREMITIES: No pedal edema, cyanosis, or clubbing.  NEUROLOGIC: unable to exam..  PSYCHIATRIC: The patient is demented. SKIN: No obvious rash, lesion, or ulcer.  DATA REVIEW:   CBC  Recent Labs Lab 04/05/16 0511 04/07/16 0421  WBC 6.2  --  HGB 8.3* 8.2*  HCT 24.7*  --   PLT 169  --     Chemistries   Recent Labs Lab 04/02/16 1749  04/04/16 1006  NA 138  < > 139  K 3.8  < > 4.6  CL 103  < > 108  CO2 27  < > 23  GLUCOSE 174*  < > 101*  BUN 43*  < > 42*  CREATININE 2.20*  < > 2.36*  CALCIUM 8.2*  < > 8.0*  AST 19  --   --   ALT 11*  --   --   ALKPHOS 79  --   --   BILITOT 0.3  --   --   < > = values in this interval not displayed.   Microbiology Results  Results for orders placed or performed during the hospital encounter of 04/03/16  MRSA PCR Screening     Status: None   Collection Time: 04/04/16 10:41 AM  Result Value Ref Range Status   MRSA by PCR NEGATIVE NEGATIVE Final    Comment:        The GeneXpert MRSA Assay (FDA approved for NASAL specimens only), is one component of a comprehensive MRSA colonization surveillance program. It is not intended to diagnose MRSA infection nor to guide or monitor treatment for MRSA infections.     RADIOLOGY:  No  results found.   Management plans discussed with the patient, family and they are in agreement.  CODE STATUS:     Code Status Orders        Start     Ordered   04/03/16 1538  Do not attempt resuscitation (DNR)  Continuous    Question Answer Comment  In the event of cardiac or respiratory ARREST Do not call a "code blue"   In the event of cardiac or respiratory ARREST Do not perform Intubation, CPR, defibrillation or ACLS   In the event of cardiac or respiratory ARREST Use medication by any route, position, wound care, and other measures to relive pain and suffering. May use oxygen, suction and manual treatment of airway obstruction as needed for comfort.      04/03/16 1537    Code Status History    Date Active Date Inactive Code Status Order ID Comments User Context   02/09/2016  3:25 PM 02/13/2016  2:48 PM Full Code [id number]  Katha HammingSnehalatha Konidena, MD ED    Advance Directive Documentation   Flowsheet Row Most Recent Value  Type of Advance Directive  Out of facility DNR (pink MOST or yellow form)  Pre-existing out of facility DNR order (yellow form or pink MOST form)  Pink MOST form placed in chart (order not valid for inpatient use), Physician notified to receive inpatient order  "MOST" Form in Place?  No data      TOTAL TIME TAKING CARE OF THIS PATIENT: 35 minutes.    Shaune Pollackhen, Rustin Erhart M.D on 04/07/2016 at 11:00 AM  Between 7am to 6pm - Pager - [phone]  After 6pm go to [email or web address] - Social research officer, governmentpassword EPAS [facility]  [facility] [place] [facility]  Office  [phone]  CC: Primary care physician; Lynnea FerrierBERT J KLEIN III, MD   Note: This dictation was prepared with Dragon dictation along with smaller phrase technology. Any transcriptional errors that result from this process are unintentional.

## 2016-04-07 NOTE — Discharge Instructions (Signed)
Heart healthy and ADA diet. °Fall and aspiration precaution. °

## 2016-04-07 NOTE — Progress Notes (Addendum)
[facility] - [place] at [facility]   PATIENT NAME: Patrick Morrison    MR#:  [id number]  DATE OF BIRTH:  07/07/1933  SUBJECTIVE:   Pt. Here after a fall and noted to have Left hip Hematoma.  Hg improved post transfusion.The patient was agitated yesterday and given some Haldol.    He is quite and demented. No agitation since yesterday. REVIEW OF SYSTEMS:    Review of Systems  Unable to perform ROS: Mental acuity    Nutrition: Heart Healthy/Carb modified Tolerating Diet: DRUG ALLERGIES:  No Known Allergies  VITALS:  Blood pressure (!) 160/62, pulse 71, temperature 98 F (36.7 C), temperature source Oral, resp. rate 18, height 5\' 6"  (1.676 m), weight 127 lb 4.8 oz (57.7 kg), SpO2 93 %.  PHYSICAL EXAMINATION:   Physical Exam  GENERAL:  83 y.o.-year-old cachectic patient lying in bed in no acute distress.  EYES: Pupils equal, round, reactive to light and accommodation. No scleral icterus. Extraocular muscles intact.  HEENT: Head atraumatic, normocephalic. Oropharynx and nasopharynx clear.  NECK:  Supple, no jugular venous distention. No thyroid enlargement, no tenderness.  LUNGS: Normal breath sounds bilaterally, no wheezing, rales, rhonchi. No use of accessory muscles of respiration.  CARDIOVASCULAR: S1, S2 normal. No murmurs, rubs, or gallops.  ABDOMEN: Soft, nontender, nondistended. Bowel sounds present. No organomegaly or mass.  EXTREMITIES: No cyanosis, clubbing or edema b/l. Left left splint in place.    NEUROLOGIC: follow commands.  PSYCHIATRIC: The patient is demented. SKIN: No obvious rash, lesion, or ulcer.    LABORATORY PANEL:   CBC  Recent Labs Lab 04/05/16 0511 04/07/16 0421  WBC 6.2  --   HGB 8.3* 8.2*  HCT 24.7*  --   PLT 169  --    ------------------------------------------------------------------------------------------------------------------  Chemistries   Recent Labs Lab 04/02/16 1749  04/04/16 1006  NA 138  < > 139  K 3.8   < > 4.6  CL 103  < > 108  CO2 27  < > 23  GLUCOSE 174*  < > 101*  BUN 43*  < > 42*  CREATININE 2.20*  < > 2.36*  CALCIUM 8.2*  < > 8.0*  AST 19  --   --   ALT 11*  --   --   ALKPHOS 79  --   --   BILITOT 0.3  --   --   < > = values in this interval not displayed. ------------------------------------------------------------------------------------------------------------------  Cardiac Enzymes  Recent Labs Lab 04/02/16 1749  TROPONINI <0.03   ------------------------------------------------------------------------------------------------------------------  RADIOLOGY:  No results found.   ASSESSMENT AND PLAN:   83 year old male with past medical history of dementia, hypertension, hyperlipidemia, diabetes, COPD, BPH, history of osteoarthritis who presented to the hospital after a fall and noted to have a left hip hematoma.  1. Left hip hematoma-secondary to a traumatic fall. This was noted on his CT scan on admission. - Hg. Improved post transfusion from 6.9 to 8.3. Stable at 8.2.   - appreciate Ortho input and no need for surgical intervention as no fracture noted.  -Continue supportive care with pain control, follow serial hemoglobin.  - cont. Supportive care for now.   2. Acute blood loss anemia - secondary on Hematoma.  - Hg. Improved post transfusion and follow-up hemoglobin.  3. DM type II - cont. Lantus, SSI.   - follow BS.    4. HTN malignancy. Started IV hydralazine, increased Metoprolol, continue Lisinopril. Add norvasc.  5. COPD - no acute exacerbation.  -  contElwin Sleight. Dulera, albuterol nebs as needed  6. CKD Stage III - Cr. Close to baseline. - renal dose meds.   7. Dementia - cont. Aricept.  - given some Haldol for agitation.    All the records are reviewed and case discussed with Care Management/Social Worker. Management plans discussed with the patient, family and they are in agreement.  CODE STATUS: DNR  DVT Prophylaxis: Ted's and SCD's.   TOTAL TIME  TAKING CARE OF THIS PATIENT: 28 minutes.   POSSIBLE D/C to SNF IN 1-2 DAYS, DEPENDING ON CLINICAL CONDITION.   Shaune Pollackhen, Giannis Corpuz M.D on 04/07/2016 at 2:46 PM  Between 7am to 6pm - Pager - [phone]  After 6pm go to [email or web address] - Social research officer, governmentpassword EPAS [facility]  [employer] [place] [facility]  Office  [phone]  CC: Primary care physician; Lynnea FerrierBERT J KLEIN III, MD

## 2016-04-07 NOTE — Progress Notes (Addendum)
PT is recommending SNF. Blue Medicare authorization was started today. Plan is for [facility] to [facility] if they have a bed available. Wife has chosen [facility] if patient can't go [facility].   [facility]. Auth # [id number], RVB, next review date 04/10/16, recommended 579 therapy minutes per week.   Baker Hughes IncorporatedBailey Griffyn Kucinski, LCSW [phone]

## 2016-04-08 LAB — GLUCOSE, CAPILLARY
Glucose-Capillary: 126 mg/dL — ABNORMAL HIGH (ref 65–99)
Glucose-Capillary: 107 mg/dL — ABNORMAL HIGH (ref 65–99)

## 2016-04-08 MED ORDER — BISACODYL 10 MG RE SUPP
10.0000 mg | Freq: Every day | RECTAL | Status: DC | PRN
  Administered 2016-04-08: 10 mg via RECTAL

## 2016-04-08 NOTE — Progress Notes (Signed)
Report called Eber Jonesarolyn, EMS called for transportation

## 2016-04-08 NOTE — Clinical Social Work Placement (Signed)
   CLINICAL SOCIAL WORK PLACEMENT  NOTE  Date:  04/08/2016  Patient Details  Name: Patrick Morrison E Nolley MRN: [id number] Date of Birth: [birth year]  Clinical Social Work is seeking post-discharge placement for this patient at the Skilled  Nursing Facility level of care (*CSW will initial, date and re-position this form in  chart as items are completed):  Yes   Patient/family provided with [place] Clinical Social Work Department's list of facilities offering this level of care within the geographic area requested by the patient (or if unable, by the patient's family).  Yes   Patient/family informed of their freedom to choose among providers that offer the needed level of care, that participate in Medicare, Medicaid or managed care program needed by the patient, have an available bed and are willing to accept the patient.  Yes   Patient/family informed of 's ownership interest in [facility] and [facility], as well as of the fact that they are under no obligation to receive care at these facilities.  PASRR submitted to EDS on       PASRR number received on       Existing PASRR number confirmed on 04/05/16     FL2 transmitted to all facilities in geographic area requested by pt/family on 04/05/16     FL2 transmitted to all facilities within larger geographic area on       Patient informed that his/her managed care company has contracts with or will negotiate with certain facilities, including the following:        Yes   Patient/family informed of bed offers received.  Patient chooses bed at  [facility] )     Physician recommends and patient chooses bed at      Patient to be transferred to  [facility] ) on 04/08/16.  Patient to be transferred to facility by  [facility] )     Patient family notified on 04/08/16 of transfer.  Name of family member notified:   (Patient's wife Corrie DandyMary is aware of D/C today. )     PHYSICIAN       Additional Comment:     _______________________________________________ Maximilian Tallo, Darleen CrockerBailey M, LCSW 04/08/2016, 9:47 AM

## 2016-04-08 NOTE — Progress Notes (Signed)
EMS here for transportation. 

## 2016-04-08 NOTE — Discharge Summary (Signed)
[facility] - [place] at [facility]   PATIENT NAME: Patrick Morrison    MR#:  [id number]  DATE OF BIRTH:  07/06/1933  DATE OF ADMISSION:  04/03/2016   ADMITTING PHYSICIAN: Shaune Pollack, MD  DATE OF DISCHARGE: 04/08/2016 PRIMARY CARE PHYSICIAN: Curtis Sites III, MD   ADMISSION DIAGNOSIS:  Hematoma [T14.8XXA] DISCHARGE DIAGNOSIS:  Active Problems:   Hip hematoma, left, initial encounter  SECONDARY DIAGNOSIS:   Past Medical History:  Diagnosis Date  . Abnormal echocardiogram   . Asymptomatic bilateral carotid artery stenosis   . Bilateral knee pain   . BPH (benign prostatic hyperplasia)   . Bruit of right carotid artery   . Chronic rhinitis   . COPD (chronic obstructive pulmonary disease) (HCC)   . Diabetic macular edema (HCC)   . DM (diabetes mellitus) (HCC)   . Dysplasia of prostate   . Edema   . Hyperlipidemia   . Hypertension   . NPDR (nonproliferative diabetic retinopathy) (HCC)   . Osteoarthritis   . Personal history of other malignant neoplasm of skin   . Primary osteoarthritis of both knees   . Pseudophakia of both eyes    HOSPITAL COURSE:   83 year old male with past medical history of dementia, hypertension, hyperlipidemia, diabetes, COPD, BPH, history of osteoarthritis who presented to the hospital after a fall and noted to have a left hip hematoma.  1. Left hip hematoma-secondary to a traumatic fall. This was noted on his CT scan on admission. - Hg. Improved post transfusion from 6.9 to 8.3.  - appreciate Ortho input and no need for surgical intervention as no fracture noted.  -Continue supportive care with pain control, follow serial hemoglobin is stable.  - cont. Supportive care for now.   2. Acute blood loss anemia - secondary on Hematoma.  - Hg. Improved post transfusion and hemoglobin is stable.  3. DM type II - cont. Lantus, SSI.   - follow BS.    4. HTN malignancy. Better controlled, on IV hydralazine, increased Metoprolol,  continue Lisinopril and Norvasc.   5. COPD - no acute exacerbation.  - cont. Dulera, albuterol nebs as needed  6. CKD Stage III - Cr. Close to baseline.  - renal dose meds.   7. Dementia - cont. Aricept.  - given some Haldol for agitation.    DISCHARGE CONDITIONS:  Stable, discharge to SNF today. CONSULTS OBTAINED:  Treatment Team:  Christena Flake, MD DRUG ALLERGIES:  No Known Allergies DISCHARGE MEDICATIONS:   Allergies as of 04/08/2016   No Known Allergies     Medication List    STOP taking these medications   enoxaparin 30 MG/0.3ML injection Commonly known as:  LOVENOX   HYDROcodone-acetaminophen 5-325 MG tablet Commonly known as:  NORCO/VICODIN   oxyCODONE-acetaminophen 5-325 MG tablet Commonly known as:  PERCOCET     TAKE these medications   acetaminophen 500 MG tablet Commonly known as:  TYLENOL Take 500 mg by mouth every 6 (six) hours as needed.   acidophilus Caps capsule Take by mouth daily.   ADVAIR DISKUS 250-50 MCG/DOSE Aepb Generic drug:  Fluticasone-Salmeterol Take 1 puff by mouth 2 (two) times daily.   amLODipine 10 MG tablet Commonly known as:  NORVASC Take 1 tablet (10 mg total) by mouth daily.   docusate sodium 100 MG capsule Commonly known as:  COLACE Take 1 capsule (100 mg total) by mouth 2 (two) times daily.   donepezil 5 MG tablet Commonly known as:  ARICEPT Take 5 mg  by mouth at bedtime.   ferrous sulfate 325 (65 FE) MG tablet Take 1 tablet (325 mg total) by mouth 3 (three) times daily after meals.   fluticasone 50 MCG/ACT nasal spray Commonly known as:  FLONASE Place 2 sprays into both nostrils daily.   Insulin Detemir 100 UNIT/ML Pen Commonly known as:  LEVEMIR FLEXTOUCH Inject 3 Units into the skin daily.   lisinopril 10 MG tablet Commonly known as:  PRINIVIL,ZESTRIL Take 10 mg by mouth daily.   loperamide 2 MG capsule Commonly known as:  IMODIUM Take 2 mg by mouth as needed for diarrhea or loose stools.     Melatonin 3 MG Tabs Take 1 tablet by mouth at bedtime.   metoprolol 50 MG tablet Commonly known as:  LOPRESSOR Take 1 tablet (50 mg total) by mouth 2 (two) times daily.   PROAIR HFA 108 (90 Base) MCG/ACT inhaler Generic drug:  albuterol Inhale 2 puffs into the lungs every 6 (six) hours as needed.   Vitamin D3 2000 units capsule Take 1 capsule by mouth daily.        DISCHARGE INSTRUCTIONS:  See AVS. If you experience worsening of your admission symptoms, develop shortness of breath, life threatening emergency, suicidal or homicidal thoughts you must seek medical attention immediately by calling 911 or calling your MD immediately  if symptoms less severe.  You Must read complete instructions/literature along with all the possible adverse reactions/side effects for all the Medicines you take and that have been prescribed to you. Take any new Medicines after you have completely understood and accpet all the possible adverse reactions/side effects.   Please note  You were cared for by a hospitalist during your hospital stay. If you have any questions about your discharge medications or the care you received while you were in the hospital after you are discharged, you can call the unit and asked to speak with the hospitalist on call if the hospitalist that took care of you is not available. Once you are discharged, your primary care physician will handle any further medical issues. Please note that NO REFILLS for any discharge medications will be authorized once you are discharged, as it is imperative that you return to your primary care physician (or establish a relationship with a primary care physician if you do not have one) for your aftercare needs so that they can reassess your need for medications and monitor your lab values.    On the day of Discharge:  VITAL SIGNS:  Blood pressure (!) 167/80, pulse 68, temperature 98.1 F (36.7 C), resp. rate 16, height 5\' 6"  (1.676 m), weight  127 lb 4.8 oz (57.7 kg), SpO2 93 %. PHYSICAL EXAMINATION:  GENERAL:  83 y.o.-year-old patient lying in the bed with no acute distress.  EYES: Pupils equal, round, reactive to light and accommodation. No scleral icterus. Extraocular muscles intact.  HEENT: Head atraumatic, normocephalic. Oropharynx and nasopharynx clear.  NECK:  Supple, no jugular venous distention. No thyroid enlargement, no tenderness.  LUNGS: Normal breath sounds bilaterally, no wheezing, rales,rhonchi or crepitation. No use of accessory muscles of respiration.  CARDIOVASCULAR: S1, S2 normal. No murmurs, rubs, or gallops.  ABDOMEN: Soft, non-tender, non-distended. Bowel sounds present. No organomegaly or mass.  EXTREMITIES: No pedal edema, cyanosis, or clubbing.  NEUROLOGIC: unable to exam..  PSYCHIATRIC: The patient is demented. SKIN: No obvious rash, lesion, or ulcer.  DATA REVIEW:   CBC  Recent Labs Lab 04/05/16 0511 04/07/16 0421  WBC 6.2  --   HGB  8.3* 8.2*  HCT 24.7*  --   PLT 169  --     Chemistries   Recent Labs Lab 04/02/16 1749  04/04/16 1006  NA 138  < > 139  K 3.8  < > 4.6  CL 103  < > 108  CO2 27  < > 23  GLUCOSE 174*  < > 101*  BUN 43*  < > 42*  CREATININE 2.20*  < > 2.36*  CALCIUM 8.2*  < > 8.0*  AST 19  --   --   ALT 11*  --   --   ALKPHOS 79  --   --   BILITOT 0.3  --   --   < > = values in this interval not displayed.   Microbiology Results  Results for orders placed or performed during the hospital encounter of 04/03/16  MRSA PCR Screening     Status: None   Collection Time: 04/04/16 10:41 AM  Result Value Ref Range Status   MRSA by PCR NEGATIVE NEGATIVE Final    Comment:        The GeneXpert MRSA Assay (FDA approved for NASAL specimens only), is one component of a comprehensive MRSA colonization surveillance program. It is not intended to diagnose MRSA infection nor to guide or monitor treatment for MRSA infections.     RADIOLOGY:  No results  found.   Management plans discussed with the patient, family and they are in agreement.  CODE STATUS:     Code Status Orders        Start     Ordered   04/03/16 1538  Do not attempt resuscitation (DNR)  Continuous    Question Answer Comment  In the event of cardiac or respiratory ARREST Do not call a "code blue"   In the event of cardiac or respiratory ARREST Do not perform Intubation, CPR, defibrillation or ACLS   In the event of cardiac or respiratory ARREST Use medication by any route, position, wound care, and other measures to relive pain and suffering. May use oxygen, suction and manual treatment of airway obstruction as needed for comfort.      04/03/16 1537    Code Status History    Date Active Date Inactive Code Status Order ID Comments User Context   02/09/2016  3:25 PM 02/13/2016  2:48 PM Full Code [id number]  Katha HammingSnehalatha Konidena, MD ED    Advance Directive Documentation   Flowsheet Row Most Recent Value  Type of Advance Directive  Out of facility DNR (pink MOST or yellow form)  Pre-existing out of facility DNR order (yellow form or pink MOST form)  Pink MOST form placed in chart (order not valid for inpatient use), Physician notified to receive inpatient order  "MOST" Form in Place?  No data      TOTAL TIME TAKING CARE OF THIS PATIENT: 33 minutes.    Shaune Pollackhen, Jaslene Marsteller M.D on 04/08/2016 at 10:07 AM  Between 7am to 6pm - Pager - [phone]  After 6pm go to [email or web address] - Social research officer, governmentpassword EPAS [facility]  [facility] [place] [facility]  Office  [phone]  CC: Primary care physician; Lynnea FerrierBERT J KLEIN III, MD   Note: This dictation was prepared with Dragon dictation along with smaller phrase technology. Any transcriptional errors that result from this process are unintentional.

## 2016-04-08 NOTE — Progress Notes (Signed)
Patient is medically stable for D/C [facility] today. [facility]. [facility] patient can come to room 702. RN will call report and arrange EMS for transport. Clinical Child psychotherapistocial Worker (CSW) sent D/C orders to [place] via [facility]. CSW contacted patient's wife Corrie DandyMary and made her aware of above. Corrie DandyMary is meeting Karla at [facility] at 1 pm today to complete admissions paper work. Please reconsult if future social work needs arise. CSW signing off.   [employer]Bailey Ryleeann Urquiza, LCSW [phone]

## 2016-04-11 ENCOUNTER — Non-Acute Institutional Stay (SKILLED_NURSING_FACILITY): Payer: Medicare Other | Admitting: Internal Medicine

## 2016-04-11 ENCOUNTER — Encounter: Payer: Self-pay | Admitting: Internal Medicine

## 2016-04-11 DIAGNOSIS — Z794 Long term (current) use of insulin: Secondary | ICD-10-CM

## 2016-04-11 DIAGNOSIS — J449 Chronic obstructive pulmonary disease, unspecified: Secondary | ICD-10-CM

## 2016-04-11 DIAGNOSIS — E1122 Type 2 diabetes mellitus with diabetic chronic kidney disease: Secondary | ICD-10-CM

## 2016-04-11 DIAGNOSIS — E43 Unspecified severe protein-calorie malnutrition: Secondary | ICD-10-CM

## 2016-04-11 DIAGNOSIS — N183 Chronic kidney disease, stage 3 unspecified: Secondary | ICD-10-CM

## 2016-04-11 DIAGNOSIS — T84029S Dislocation of unspecified internal joint prosthesis, sequela: Secondary | ICD-10-CM | POA: Diagnosis not present

## 2016-04-11 DIAGNOSIS — K5909 Other constipation: Secondary | ICD-10-CM

## 2016-04-11 DIAGNOSIS — D62 Acute posthemorrhagic anemia: Secondary | ICD-10-CM

## 2016-04-11 DIAGNOSIS — I10 Essential (primary) hypertension: Secondary | ICD-10-CM

## 2016-04-11 DIAGNOSIS — R2681 Unsteadiness on feet: Secondary | ICD-10-CM

## 2016-04-11 DIAGNOSIS — E785 Hyperlipidemia, unspecified: Secondary | ICD-10-CM

## 2016-04-11 DIAGNOSIS — F0390 Unspecified dementia without behavioral disturbance: Secondary | ICD-10-CM

## 2016-04-11 NOTE — Progress Notes (Signed)
LOCATION: [facility]  PCP: Lynnea Ferrier, MD    Code Status: DNR  Goals of care: Advanced Directive information Advanced Directives 04/11/2016  Does Patient Have a Medical Advance Directive? Yes  Type of Advance Directive Out of facility DNR (pink MOST or yellow form)  Does patient want to make changes to medical advance directive? No - Patient declined  Copy of Healthcare Power of Attorney in Chart? -  Pre-existing out of facility DNR order (yellow form or pink MOST form) -       Extended Emergency Contact Information Primary Emergency Contact: [facility] Address: [address]          [place], [place] [address] [place] Home Phone: [phone] Relation: Spouse Secondary Emergency Contact: Jones Skene [place] Home Phone: [phone] Mobile Phone: [phone] Relation: Son   No Known Allergies  Chief Complaint  Patient presents with  . New Admit To SNF    New Admission Visit      HPI:  Patient is a 83 y.o. male seen today for short term rehabilitation post hospital admission from 04/03/2016-04/08/2016 post fall with left hip hematoma and prosthetic dislocation of left hip. Acute fracture was ruled out. Orthopedic was consulted and no surgical intervention was recommended. He had a drop in his hemoglobin and required PRBC transfusion.He has medical history of dementia, BPH, chronic rhinitis, hypertension, COPD, diabetes mellitus among others. He is seen in his room today.  Review of Systems: Limited with dementia Constitutional: Negative for fever, chills.  HENT: Negative for headache, congestion, sore throat. Positive for difficulty swallowing.  Has runny nose. Eyes: Negative for eye pain, double vision and discharge.  Respiratory: Negative for shortness of breath and wheezing. Positive for occasional cough   Cardiovascular: Negative for chest pain, palpitations.  Gastrointestinal: Negative for heartburn, nausea,  vomiting, abdominal pain. Doesn't remember his last bowel movement.  Genitourinary: Negative for dysuria Musculoskeletal: Negative for back pain, fall in the facility. Positive for left leg pain.  Skin: Negative for itching, rash.  Neurological: Negative for dizziness. Psychiatric/Behavioral: Negative for depression   Past Medical History:  Diagnosis Date  . Abnormal echocardiogram   . Asymptomatic bilateral carotid artery stenosis   . Bilateral knee pain   . BPH (benign prostatic hyperplasia)   . Bruit of right carotid artery   . Chronic rhinitis   . COPD (chronic obstructive pulmonary disease) (HCC)   . Diabetic macular edema (HCC)   . DM (diabetes mellitus) (HCC)   . Dysplasia of prostate   . Edema   . Hyperlipidemia   . Hypertension   . NPDR (nonproliferative diabetic retinopathy) (HCC)   . Osteoarthritis   . Personal history of other malignant neoplasm of skin   . Primary osteoarthritis of both knees   . Pseudophakia of both eyes    Past Surgical History:  Procedure Laterality Date  . ANKLE SURGERY    . COLECTOMY    . COLON SURGERY    . HIP ARTHROPLASTY Left 02/10/2016   Procedure: ARTHROPLASTY BIPOLAR HIP (HEMIARTHROPLASTY);  Surgeon: Christena Flake, MD;  Location: [facility] ORS;  Service: Orthopedics;  Laterality: Left;   Social History:   reports that he quit smoking about 12 years ago. His smoking use included Cigarettes. He has a 60.00 pack-year smoking history. He has never used smokeless tobacco. He reports that he does not drink alcohol or use drugs.  Family History  Problem Relation Age of Onset  . Cancer Father   .  Stroke Father     Medications: Allergies as of 04/11/2016   No Known Allergies     Medication List       Accurate as of 04/11/16 12:16 PM. Always use your most recent med list.          acetaminophen 500 MG tablet Commonly known as:  TYLENOL Take 500 mg by mouth every 6 (six) hours as needed.   acidophilus Caps capsule Take by mouth  daily.   ADVAIR DISKUS 250-50 MCG/DOSE Aepb Generic drug:  Fluticasone-Salmeterol Take 1 puff by mouth 2 (two) times daily.   amLODipine 10 MG tablet Commonly known as:  NORVASC Take 1 tablet (10 mg total) by mouth daily.   docusate sodium 100 MG capsule Commonly known as:  COLACE Take 1 capsule (100 mg total) by mouth 2 (two) times daily.   donepezil 5 MG tablet Commonly known as:  ARICEPT Take 5 mg by mouth at bedtime.   ferrous sulfate 325 (65 FE) MG tablet Take 1 tablet (325 mg total) by mouth 3 (three) times daily after meals.   fluticasone 50 MCG/ACT nasal spray Commonly known as:  FLONASE Place 2 sprays into both nostrils daily.   insulin aspart 100 UNIT/ML injection Commonly known as:  novoLOG Inject 3 Units into the skin 4 (four) times daily -  before meals and at bedtime. If CBG is greater then 150   insulin detemir 100 UNIT/ML injection Commonly known as:  LEVEMIR Inject 6 Units into the skin at bedtime.   lisinopril 10 MG tablet Commonly known as:  PRINIVIL,ZESTRIL Take 10 mg by mouth daily.   loperamide 2 MG capsule Commonly known as:  IMODIUM Take 2 mg by mouth as needed for diarrhea or loose stools.   Melatonin 3 MG Tabs Take 1 tablet by mouth at bedtime.   metoprolol 50 MG tablet Commonly known as:  LOPRESSOR Take 1 tablet (50 mg total) by mouth 2 (two) times daily.   PROAIR HFA 108 (90 Base) MCG/ACT inhaler Generic drug:  albuterol Inhale 2 puffs into the lungs every 6 (six) hours as needed.   rosuvastatin 5 MG tablet Commonly known as:  CRESTOR Take 5 mg by mouth daily.   Vitamin D3 2000 units capsule Take 1 capsule by mouth daily.       Immunizations: Immunization History  Administered Date(s) Administered  . PPD Test 04/08/2016     Physical Exam: Vitals:   04/11/16 1152  BP: (!) 148/69  Pulse: 71  Resp: 18  Temp: 98.4 F (36.9 C)  TempSrc: Oral  SpO2: 94%  Weight: 111 lb (50.3 kg)  Height: 5\' 6"  (1.676 m)   Body  mass index is 17.92 kg/m.  General- elderly Male, thin built, frail, in no acute distress  Head- normocephalic, atraumatic Nose- no nasal discharge Throat- moist mucus membrane Eyes- PERRLA, EOMI, no pallor, no icterus, no discharge, normal conjunctiva, normal sclera Neck- no cervical lymphadenopathy Cardiovascular- normal s1,s2, no murmur Respiratory- bilateral clear to auscultation, no wheeze, no rhonchi, no crackles, no use of accessory muscles Abdomen- bowel sounds present, soft, non tender, no guarding or rigidity, no CVA tenderness Musculoskeletal- limited range of motion to left leg, able to move all other extremities, on wheelchair, no leg edema, knee immobilizer in place Neurological- alert and oriented to self only Skin- warm and dry, senile purpura present Psychiatry- normal mood and affect    Labs reviewed: Basic Metabolic Panel:  Recent Labs  69/62/95 1749 04/03/16 1153 04/04/16 1006  NA 138  141 139  K 3.8 5.0 4.6  CL 103 107 108  CO2 27 28 23   GLUCOSE 174* 77 101*  BUN 43* 49* 42*  CREATININE 2.20* 2.60* 2.36*  CALCIUM 8.2* 8.3* 8.0*   Liver Function Tests:  Recent Labs  05/05/15 2002 02/09/16 1250 04/02/16 1749  AST 16 22 19   ALT 17 14* 11*  ALKPHOS 87 70 79  BILITOT 0.6 0.9 0.3  PROT 7.0 6.2* 6.1*  ALBUMIN 3.6 3.2* 2.8*   No results for input(s): LIPASE, AMYLASE in the last 8760 hours. No results for input(s): AMMONIA in the last 8760 hours. CBC:  Recent Labs  02/11/16 0640  04/02/16 1749 04/03/16 1153  04/04/16 1006 04/05/16 0511 04/07/16 0421  WBC 9.7  < > 5.7 7.3  --  6.2 6.2  --   NEUTROABS 8.3*  --  4.1 5.4  --   --   --   --   HGB 8.8*  < > 9.9* 7.7*  < > 6.9* 8.3* 8.2*  HCT 26.0*  < > 29.0* 22.7*  --  20.5* 24.7*  --   MCV 98.3  < > 97.0 97.7  --  96.9 95.5  --   PLT 127*  < > 227 194  --  172 169  --   < > = values in this interval not displayed. Cardiac Enzymes:  Recent Labs  04/02/16 1749  CKTOTAL 102  TROPONINI  <0.03   BNP: Invalid input(s): POCBNP CBG:  Recent Labs  04/07/16 2140 04/08/16 0747 04/08/16 1129  GLUCAP 148* 126* 107*    Radiological Exams: Ct Hip Left Wo Contrast  Result Date: 04/03/2016 CLINICAL DATA:  Left hip prosthesis, dislocation yesterday with reduction, continued pain. EXAM: CT OF THE LEFT HIP WITHOUT CONTRAST TECHNIQUE: Multidetector CT imaging of the left hip was performed according to the standard protocol. Multiplanar CT image reconstructions were also generated. COMPARISON:  01/31/2017 FINDINGS: Bones/Joint/Cartilage No convincing fracture. Faint linear bands of calcification along the anterior cortical margin of the proximal femur without cortical displacement, this is probably chronic. Spurring along the acetabulum without an acetabular fracture. No lucency around the stem portion of the implant. No lower sacral or coccygeal fracture is seen. Ligaments Suboptimally assessed by CT. Muscles and Tendons Prominent piriformis hematoma measuring about 13.7 by 4.2 cm on image 1/4, this extends with the muscle through the sciatic notch. The hematoma also appears to extend down along the posterior portion of the operator internus and into the quadratus femoris and gluteus maximus with associated edema in these muscles and associated considerable subcutaneous edema in the region. The sciatic nerve is not impinged upon at the sciatic notch but is displaced by hematoma below the level of the sciatic notch. Nonspecific presacral edema along the lower sacrum. Suspected edema in the gluteus medius potentially with trochanteric bursitis. Soft tissues Extensive subcutaneous edema lateral and posterior to the left hip. IMPRESSION: 1. Considerable hematoma extending within and along the left piriformis, operator internus, quadratus femoris, and gluteus maximus muscles. The piriformis component of the hematoma extends within the muscle through the sciatic notch region, with both intrapelvic and  extrapelvic components. Although there is no direct sciatic notch impingement on the sciatic nerve, the sciatic nerve is displaced by hematoma just below the level of the sciatic notch. 2. Edema in the gluteus medius with adjacent trochanteric bursitis. 3. Nonspecific presacral edema. 4. No convincing fracture. There is some calcification along the anterior periosteal margin of the left proximal femur which is probably  incidental. Electronically Signed   By: Gaylyn Rong M.D.   On: 04/03/2016 12:06   Dg Hip Unilat W Or W/o Pelvis 2-3 Views Left  Result Date: 04/02/2016 CLINICAL DATA:  Status post left hip reduction. EXAM: DG HIP (WITH OR WITHOUT PELVIS) 2-3V LEFT COMPARISON:  Earlier the same day. FINDINGS: A single frontal view of the left hip shows interval reduction of the femoral component dislocation seen previously. No evidence for periprosthetic fracture. Bones are diffusely demineralized. IMPRESSION: Left hip dislocation seen previously has been reduced in the interval. Electronically Signed   By: Kennith Center M.D.   On: 04/02/2016 20:15   Dg Hip Unilat W Or W/o Pelvis 2-3 Views Left  Result Date: 04/02/2016 CLINICAL DATA:  Pt. Had fall at [facility]. Pt. Has pain to lt. Hip. EMS reports hx of fracture to lt. Hip. Pt. Denies LOC. Pt. Has skin tear to lt. Forearm. EXAM: DG HIP (WITH OR WITHOUT PELVIS) 2-3V LEFT COMPARISON:  02/10/2016 FINDINGS: The left hip prosthesis has dislocated superior to the acetabulum. There is no convincing fracture. The prosthetic component appears well seated. SI joints, symphysis pubis and right hip joint are normally spaced and aligned. Bones are diffusely demineralized. There are dense aortoiliac and femoral vessel atherosclerotic calcifications. A bowel anastomosis staple line lies in the central pelvis. IMPRESSION: Dislocated left hip prosthesis.  No convincing fracture. Electronically Signed   By: Amie Portland M.D.   On: 04/02/2016 18:35     Assessment/Plan  Unsteady gait From recent fall with left hip dislocation. Will need to work with physical therapy and occupational therapy to help regain his strength and restore his balance. Fall precautions to be taken.  Left hip dislocation Prosthetic hip dislocation. Seen by orthopedic in consultative management recommended. Will need to work with physical therapy and occupational therapy to work on his balance, safety transfers and gait training. Weightbearing as tolerated to left lower extremity and to wear his immobilizer all the time. Continue Tylenol 500 mg every 6 hours as needed for pain. PMR consult.   Blood loss anemia With left hip hematoma. Status post 1 unit packed red blood cell transfusion. Monitor H&H. Continue ferrous sulfate 325 mg 3 times a day for now.  Protein calorie malnutrition Get registered dietitian consult. Monitor oral intake and weekly weight.  Hypertension Monitor blood pressure reading. Continue Lopressor 50 mg twice a day, lisinopril 10 mg daily and amlodipine 10 mg daily. Check BMP.  Hyperlipidemia Continue Crestor 5 mg daily  Chronic constipation Continue Colace 100 mg twice a day  Type 2 diabetes mellitus with ckd 3 Monitor blood sugar reading. His Levemir has been increased to 6 units from 3 units over the weekend for elevated blood sugar reading and he is also now on sliding scale insulin NovoLog. No hemoglobin A1c for review. Given his age, recent falls, discontinue sliding scale insulin to avoid hypoglycemic episodes. Check hemoglobin A1c. Continue Levemir 6 units daily for now. Check bmp   No results found for: HGBA1C   COPD Breathing has been stable. Continue Advair  Dementia without behavioral disturbance High fall risk. Supportive care to be provided. Will need to take his Aricept 5 mg daily. Patient was discharged on Aricept from the hospital but has not been receiving it at this facility for unclear reason. Resume his Aricept.  SLP consult    Goals of care: short term rehabilitation   Labs/tests ordered: cbc, bmp 04/14/16  Family/ staff Communication: reviewed care plan with patient and nursing supervisor  Blanchie Serve, MD Internal Medicine [facility] [address] [place], [place] [address] Cell Phone (Monday-Friday 8 am - 5 pm): [phone] On Call: [phone] and follow prompts after 5 pm and on weekends Office Phone: [phone] Office Fax: [phone]

## 2016-04-13 ENCOUNTER — Non-Acute Institutional Stay (SKILLED_NURSING_FACILITY): Payer: Medicare Other | Admitting: Family

## 2016-04-13 DIAGNOSIS — M25552 Pain in left hip: Secondary | ICD-10-CM

## 2016-04-13 DIAGNOSIS — I10 Essential (primary) hypertension: Secondary | ICD-10-CM

## 2016-04-13 DIAGNOSIS — M25551 Pain in right hip: Secondary | ICD-10-CM

## 2016-04-13 NOTE — Progress Notes (Signed)
Location:  [facility] Room Number: 702  Place of Service:  SNF [phone] Provider: Samarra Ridgely FNP-C   [name], MD  Patient Care Team: Lynnea FerrierBert J Klein III, MD as PCP - General (Internal Medicine)  Extended Emergency Contact Information Primary Emergency Contact: [name] Address: [address]          [place], [place] [address] [place] Home Phone: [phone] Relation: Spouse Secondary Emergency Contact: [name],[name]  [place] Home Phone: [phone] Mobile Phone: [phone] Relation: Son  Code Status:  DNR  Goals of care: Advanced Directive information Advanced Directives 04/11/2016  Does Patient Have a Medical Advance Directive? Yes  Type of Advance Directive Out of facility DNR (pink MOST or yellow form)  Does patient want to make changes to medical advance directive? No - Patient declined  Copy of Healthcare Power of Attorney in Chart? -  Pre-existing out of facility DNR order (yellow form or pink MOST form) -     Chief Complaint  Patient presents with  . Acute Visit    elevated B/P     HPI:  Pt is a 83 y.o. male seen today for an acute visit for evaluation of elevated blood pressure. He has a significant medical history of HTN, Hyperlipidemia, Type 2 DM, OA among other conditions.He is seen in his room today per facility Nurse request. Facility Nurse reports patient's SBP has been running in the 160's-170's. Patient has refused to take B/P medication in the morning and also refuses pain medication. Patient moans and groans during peri-care due to hip pain but refused pain medication. He was seen by in house PMR specialist prior to visit and Lidocaine patch to left hip ordered and left immobilizer at all times. WBAT with walker.    Past Medical History:  Diagnosis Date  . Abnormal echocardiogram   . Asymptomatic bilateral carotid artery stenosis   . Bilateral knee pain   . BPH (benign  prostatic hyperplasia)   . Bruit of right carotid artery   . Chronic rhinitis   . COPD (chronic obstructive pulmonary disease) (HCC)   . Diabetic macular edema (HCC)   . DM (diabetes mellitus) (HCC)   . Dysplasia of prostate   . Edema   . Hyperlipidemia   . Hypertension   . NPDR (nonproliferative diabetic retinopathy) (HCC)   . Osteoarthritis   . Personal history of other malignant neoplasm of skin   . Primary osteoarthritis of both knees   . Pseudophakia of both eyes    Past Surgical History:  Procedure Laterality Date  . ANKLE SURGERY    . COLECTOMY    . COLON SURGERY    . HIP ARTHROPLASTY Left 02/10/2016   Procedure: ARTHROPLASTY BIPOLAR HIP (HEMIARTHROPLASTY);  Surgeon: Christena FlakeJohn J Poggi, MD;  Location: [facility] ORS;  Service: Orthopedics;  Laterality: Left;    No Known Allergies  Allergies as of 04/13/2016   No Known Allergies     Medication List       Accurate as of 04/13/16  5:38 PM. Always use your most recent med list.          acetaminophen 500 MG tablet Commonly known as:  TYLENOL Take 500 mg by mouth every 6 (six) hours as needed.   acidophilus Caps capsule Take by mouth daily.   ADVAIR DISKUS 250-50 MCG/DOSE Aepb Generic drug:  Fluticasone-Salmeterol Take 1 puff by mouth 2 (two) times daily.   amLODipine 10 MG tablet Commonly known  as:  NORVASC Take 1 tablet (10 mg total) by mouth daily.   docusate sodium 100 MG capsule Commonly known as:  COLACE Take 1 capsule (100 mg total) by mouth 2 (two) times daily.   donepezil 5 MG tablet Commonly known as:  ARICEPT Take 5 mg by mouth at bedtime.   ferrous sulfate 325 (65 FE) MG tablet Take 1 tablet (325 mg total) by mouth 3 (three) times daily after meals.   fluticasone 50 MCG/ACT nasal spray Commonly known as:  FLONASE Place 2 sprays into both nostrils daily.   insulin aspart 100 UNIT/ML injection Commonly known as:  novoLOG Inject 3 Units into the skin 4 (four) times daily -  before meals and at  bedtime. If CBG is greater then 150   insulin detemir 100 UNIT/ML injection Commonly known as:  LEVEMIR Inject 6 Units into the skin at bedtime.   lisinopril 10 MG tablet Commonly known as:  PRINIVIL,ZESTRIL Take 10 mg by mouth daily.   loperamide 2 MG capsule Commonly known as:  IMODIUM Take 2 mg by mouth as needed for diarrhea or loose stools.   Melatonin 3 MG Tabs Take 1 tablet by mouth at bedtime.   metoprolol 50 MG tablet Commonly known as:  LOPRESSOR Take 1 tablet (50 mg total) by mouth 2 (two) times daily.   PROAIR HFA 108 (90 Base) MCG/ACT inhaler Generic drug:  albuterol Inhale 2 puffs into the lungs every 6 (six) hours as needed.   rosuvastatin 5 MG tablet Commonly known as:  CRESTOR Take 5 mg by mouth daily.   Vitamin D3 2000 units capsule Take 1 capsule by mouth daily.       Review of Systems  Constitutional: Negative for activity change, appetite change, chills, fatigue and fever.  HENT: Negative for congestion, rhinorrhea, sinus pain, sinus pressure, sneezing and sore throat.   Eyes: Negative.   Respiratory: Negative for cough, chest tightness, shortness of breath and wheezing.   Cardiovascular: Negative for chest pain, palpitations and leg swelling.  Gastrointestinal: Negative for abdominal distention, abdominal pain, constipation, diarrhea, nausea and vomiting.  Genitourinary: Negative for dysuria, flank pain, frequency and urgency.  Musculoskeletal: Positive for gait problem.       Left hip pain worsen than the right.   Skin: Negative for color change, pallor and rash.  Neurological: Negative for dizziness, seizures, syncope, light-headedness and headaches.  Hematological: Does not bruise/bleed easily.  Psychiatric/Behavioral: Negative for agitation, confusion, hallucinations and sleep disturbance. The patient is not nervous/anxious.     Immunization History  Administered Date(s) Administered  . PPD Test 04/08/2016   Pertinent  Health  Maintenance Due  Topic Date Due  . HEMOGLOBIN A1C  [birth year]/06/1933  . FOOT EXAM  11/30/1945  . OPHTHALMOLOGY EXAM  11/30/1945  . PNA vac Low Risk Adult (1 of 2 - PCV13) 11/30/2000  . INFLUENZA VACCINE  10/20/2015   Fall Risk  06/15/2015 05/18/2015  Falls in the past year? Yes Yes  Number falls in past yr: - 2 or more  Injury with Fall? - Yes  Risk Factor Category  - High Fall Risk  Risk for fall due to : - History of fall(s)  Follow up - Education provided;Falls prevention discussed    Vitals:   04/13/16 1400  BP: (!) 168/74  Pulse: 66  Resp: 18  Temp: 97.7 F (36.5 C)  SpO2: 95%  Weight: 111 lb (50.3 kg)  Height: 5\' 6"  (1.676 m)   Body mass index is 17.92 kg/m. Physical  Exam  Constitutional:  Thin frail elderly  HENT:  Head: Normocephalic.  Mouth/Throat: Oropharynx is clear and moist. No oropharyngeal exudate.  Eyes: Conjunctivae and EOM are normal. Pupils are equal, round, and reactive to light. Right eye exhibits no discharge. Left eye exhibits no discharge. No scleral icterus.  Neck: Normal range of motion. No JVD present. No thyromegaly present.  Cardiovascular: Normal rate, regular rhythm, normal heart sounds and intact distal pulses.  Exam reveals no gallop and no friction rub.   No murmur heard. Pulmonary/Chest: Effort normal and breath sounds normal. No respiratory distress. He has no wheezes. He has no rales.  Abdominal: Soft. Bowel sounds are normal. He exhibits no distension. There is no tenderness. There is no rebound and no guarding.  Musculoskeletal: He exhibits no edema or tenderness.  Moves x 4 extremities unsteady gait. Limited ROM to left hip secondary to pain   Lymphadenopathy:    He has no cervical adenopathy.  Neurological: He is alert.  Skin: Skin is warm and dry. No rash noted. No erythema. No pallor.  Left hip bruise   Psychiatric: He has a normal mood and affect.    Labs reviewed:  Recent Labs  04/02/16 1749 04/03/16 1153 04/04/16 1006  NA  138 141 139  K 3.8 5.0 4.6  CL 103 107 108  CO2 27 28 23   GLUCOSE 174* 77 101*  BUN 43* 49* 42*  CREATININE 2.20* 2.60* 2.36*  CALCIUM 8.2* 8.3* 8.0*    Recent Labs  05/05/15 2002 02/09/16 1250 04/02/16 1749  AST 16 22 19   ALT 17 14* 11*  ALKPHOS 87 70 79  BILITOT 0.6 0.9 0.3  PROT 7.0 6.2* 6.1*  ALBUMIN 3.6 3.2* 2.8*    Recent Labs  02/11/16 0640  04/02/16 1749 04/03/16 1153  04/04/16 1006 04/05/16 0511 04/07/16 0421  WBC 9.7  < > 5.7 7.3  --  6.2 6.2  --   NEUTROABS 8.3*  --  4.1 5.4  --   --   --   --   HGB 8.8*  < > 9.9* 7.7*  < > 6.9* 8.3* 8.2*  HCT 26.0*  < > 29.0* 22.7*  --  20.5* 24.7*  --   MCV 98.3  < > 97.0 97.7  --  96.9 95.5  --   PLT 127*  < > 227 194  --  172 169  --   < > = values in this interval not displayed.  Assessment/Plan 1. Essential hypertension Refused B/p meds in the morning. Start clonidine 0.1 mg transdermal patch.continue to encourage patient to take oral medications. Monitor vital signs every shift.    2. Pain of both hip joints Refused to take pain medications.seen by in house PMR specialist prior to visit and Lidocaine patch to left hip ordered and left immobilizer at all times. WBAT with walker. Continue PT/OT for ROM, Exercise, gait stability and muscle strengthening.   Family/ staff Communication: Reviewed plan of care with patient, facility Nurse and supervisor.   Labs/tests ordered: None

## 2016-04-27 ENCOUNTER — Non-Acute Institutional Stay (SKILLED_NURSING_FACILITY): Payer: Medicare Other | Admitting: Family

## 2016-04-27 DIAGNOSIS — I82412 Acute embolism and thrombosis of left femoral vein: Secondary | ICD-10-CM | POA: Diagnosis not present

## 2016-04-27 MED ORDER — ENOXAPARIN SODIUM 30 MG/0.3ML ~~LOC~~ SOLN: 30.0000 mg | Freq: Two times a day (BID) | SUBCUTANEOUS | Status: DC

## 2016-04-27 NOTE — Progress Notes (Signed)
Location:  [facility] Room Number: 702  Place of Service:  SNF [phone] Provider: Cherae Marton FNP-C   Patrick Nani RavensJ KLEIN III, MD  Patient Care Team: Patrick FerrierBert J Klein III, MD as PCP - General (Internal Medicine)  Extended Emergency Contact Information Primary Emergency Contact: Patrick Morrison,Patrick Morrison Address: [address]          [place], [place] [address] Patrick Morrison Home Phone: [phone] Relation: Spouse Secondary Emergency Contact: Patrick Morrison,Patrick Morrison  Patrick Morrison Home Phone: [phone] Mobile Phone: [phone] Relation: Son  Code Status:  DNR  Goals of care: Advanced Directive information Advanced Directives 04/11/2016  Does Patient Have a Medical Advance Directive? Yes  Type of Advance Directive Out of facility DNR (pink MOST or yellow form)  Does patient want to make changes to medical advance directive? No - Patient declined  Copy of Healthcare Power of Attorney in Chart? -  Pre-existing out of facility DNR order (yellow form or pink MOST form) -     Chief Complaint  Patient presents with  . Acute Visit    right hand swelling     HPI:  Pt is a 83 y.o. male seen today for an acute visit for evaluation of right hand swelling per nurse report.He is seen in his room today.He denies any swelling on right hand. He states left hip pain has improved so long as he is lying still in bed but worse with movement. He continues to wear left immobilizer at all times.He denies any fever or chills. No swelling on right hand noted but has bilateral lower extremities swelling worst on left leg. Ultrasound already ordered by PMR specialist.    Past Medical History:  Diagnosis Date  . Abnormal echocardiogram   . Asymptomatic bilateral carotid artery stenosis   . Bilateral knee pain   . BPH (benign prostatic hyperplasia)   . Bruit of right carotid artery   . Chronic rhinitis   . COPD (chronic obstructive pulmonary disease) (HCC)   . Diabetic  macular edema (HCC)   . DM (diabetes mellitus) (HCC)   . Dysplasia of prostate   . Edema   . Hyperlipidemia   . Hypertension   . NPDR (nonproliferative diabetic retinopathy) (HCC)   . Osteoarthritis   . Personal history of other malignant neoplasm of skin   . Primary osteoarthritis of both knees   . Pseudophakia of both eyes    Past Surgical History:  Procedure Laterality Date  . ANKLE SURGERY    . COLECTOMY    . COLON SURGERY    . HIP ARTHROPLASTY Left 02/10/2016   Procedure: ARTHROPLASTY BIPOLAR HIP (HEMIARTHROPLASTY);  Surgeon: Christena FlakeJohn J Poggi, MD;  Location: [facility] ORS;  Service: Orthopedics;  Laterality: Left;    No Known Allergies  Allergies as of 04/27/2016   No Known Allergies     Medication List       Accurate as of 04/27/16  5:25 PM. Always use your most recent med list.          acetaminophen 500 MG tablet Commonly known as:  TYLENOL Take 500 mg by mouth every 6 (six) hours as needed.   acidophilus Caps capsule Take by mouth daily.   ADVAIR DISKUS 250-50 MCG/DOSE Aepb Generic drug:  Fluticasone-Salmeterol Take 1 puff by mouth 2 (two) times daily.   amLODipine 10 MG tablet Commonly known as:  NORVASC Take 1 tablet (10 mg total) by mouth daily.   docusate sodium 100 MG capsule Commonly known  as:  COLACE Take 1 capsule (100 mg total) by mouth 2 (two) times daily.   donepezil 5 MG tablet Commonly known as:  ARICEPT Take 5 mg by mouth at bedtime.   ferrous sulfate 325 (65 FE) MG tablet Take 1 tablet (325 mg total) by mouth 3 (three) times daily after meals.   fluticasone 50 MCG/ACT nasal spray Commonly known as:  FLONASE Place 2 sprays into both nostrils daily.   insulin aspart 100 UNIT/ML injection Commonly known as:  novoLOG Inject 3 Units into the skin 4 (four) times daily -  before meals and at bedtime. If CBG is greater then 150   insulin detemir 100 UNIT/ML injection Commonly known as:  LEVEMIR Inject 6 Units into the skin at bedtime.     lisinopril 10 MG tablet Commonly known as:  PRINIVIL,ZESTRIL Take 10 mg by mouth daily.   loperamide 2 MG capsule Commonly known as:  IMODIUM Take 2 mg by mouth as needed for diarrhea or loose stools.   Melatonin 3 MG Tabs Take 1 tablet by mouth at bedtime.   metoprolol 50 MG tablet Commonly known as:  LOPRESSOR Take 1 tablet (50 mg total) by mouth 2 (two) times daily.   PROAIR HFA 108 (90 Base) MCG/ACT inhaler Generic drug:  albuterol Inhale 2 puffs into the lungs every 6 (six) hours as needed.   rosuvastatin 5 MG tablet Commonly known as:  CRESTOR Take 5 mg by mouth daily.   Vitamin D3 2000 units capsule Take 1 capsule by mouth daily.       Review of Systems  Constitutional: Negative for activity change, appetite change, chills, fatigue and fever.  HENT: Negative for congestion, rhinorrhea, sinus pain, sinus pressure, sneezing and sore throat.   Eyes: Negative.   Respiratory: Negative for cough, chest tightness, shortness of breath and wheezing.   Cardiovascular: Positive for leg swelling. Negative for chest pain and palpitations.  Gastrointestinal: Negative for abdominal distention, abdominal pain, constipation, diarrhea, nausea and vomiting.  Genitourinary: Negative for dysuria, flank pain, frequency and urgency.  Musculoskeletal: Positive for gait problem.       Left hip pain    Skin: Negative for color change, pallor and rash.  Neurological: Negative for dizziness, seizures, syncope, light-headedness and headaches.  Hematological: Does not bruise/bleed easily.  Psychiatric/Behavioral: Negative for agitation, confusion, hallucinations and sleep disturbance. The patient is not nervous/anxious.     Immunization History  Administered Date(s) Administered  . PPD Test 04/08/2016   Pertinent  Health Maintenance Due  Topic Date Due  . HEMOGLOBIN A1C  [birth year]  . FOOT EXAM  11/30/1943  . OPHTHALMOLOGY EXAM  11/30/1943  . PNA vac Low Risk Adult (1 of 2 - PCV13)  11/30/2000  . INFLUENZA VACCINE  10/20/2015   Fall Risk  06/15/2015 05/18/2015  Falls in the past year? Yes Yes  Number falls in past yr: - 2 or more  Injury with Fall? - Yes  Risk Factor Category  - High Fall Risk  Risk for fall due to : - History of fall(s)  Follow up - Education provided;Falls prevention discussed    Vitals:   04/27/16 1130  BP: (!) 166/64  Pulse: 67  Resp: 18  Temp: 98.6 F (37 C)  SpO2: 93%  Weight: 114 lb 12.8 oz (52.1 kg)  Height: 5\' 6"  (1.676 m)   Body mass index is 18.53 kg/m. Physical Exam  Constitutional:  Thin frail elderly  HENT:  Head: Normocephalic.  Mouth/Throat: Oropharynx is clear and moist.  No oropharyngeal exudate.  Eyes: Conjunctivae and EOM are normal. Pupils are equal, round, and reactive to light. Right eye exhibits no discharge. Left eye exhibits no discharge. No scleral icterus.  Neck: Normal range of motion. No JVD present. No thyromegaly present.  Cardiovascular: Normal rate, regular rhythm, normal heart sounds and intact distal pulses.  Exam reveals no gallop and no friction rub.   No murmur heard. Pulmonary/Chest: Effort normal and breath sounds normal. No respiratory distress. He has no wheezes. He has no rales.  Abdominal: Soft. Bowel sounds are normal. He exhibits no distension. There is no tenderness. There is no rebound and no guarding.  Musculoskeletal: He exhibits no edema or tenderness.   Limited ROM to left hip secondary to pain. Left leg edema worse than right leg.   Lymphadenopathy:    He has no cervical adenopathy.  Neurological: He is alert.  Skin: Skin is warm and dry. No rash noted. No erythema. No pallor.  Psychiatric: He has a normal mood and affect.    Labs reviewed:  Recent Labs  04/02/16 1749 04/03/16 1153 04/04/16 1006  NA 138 141 139  K 3.8 5.0 4.6  CL 103 107 108  CO2 27 28 23   GLUCOSE 174* 77 101*  BUN 43* 49* 42*  CREATININE 2.20* 2.60* 2.36*  CALCIUM 8.2* 8.3* 8.0*    Recent Labs   05/05/15 2002 02/09/16 1250 04/02/16 1749  AST 16 22 19   ALT 17 14* 11*  ALKPHOS 87 70 79  BILITOT 0.6 0.9 0.3  PROT 7.0 6.2* 6.1*  ALBUMIN 3.6 3.2* 2.8*    Recent Labs  02/11/16 0640  04/02/16 1749 04/03/16 1153  04/04/16 1006 04/05/16 0511 04/07/16 0421  WBC 9.7  < > 5.7 7.3  --  6.2 6.2  --   NEUTROABS 8.3*  --  4.1 5.4  --   --   --   --   HGB 8.8*  < > 9.9* 7.7*  < > 6.9* 8.3* 8.2*  HCT 26.0*  < > 29.0* 22.7*  --  20.5* 24.7*  --   MCV 98.3  < > 97.0 97.7  --  96.9 95.5  --   PLT 127*  < > 227 194  --  172 169  --   < > = values in this interval not displayed.  Assessment/Plan  Left Femoral thrombus  Ultrasound positive for left Common femoral thrombus. Will hold therapy for now. Start Lovenox 30 mg /0.3 ml inject 30 mg SQ every 12 hours.obtain CBC, BMP 05/02/2016.    Family/ staff Communication: Reviewed plan of care with patient, facility Nurse and supervisor.   Labs/tests ordered: CBC, BMP 05/02/2016.

## 2016-04-29 ENCOUNTER — Encounter (HOSPITAL_COMMUNITY): Payer: Self-pay | Admitting: Emergency Medicine

## 2016-04-29 ENCOUNTER — Inpatient Hospital Stay (HOSPITAL_COMMUNITY)
Admission: EM | Admit: 2016-04-29 | Discharge: 2016-05-04 | DRG: 190 | Disposition: A | Discharge status: Skilled Nursing Facility | Payer: Medicare Other | Attending: Internal Medicine | Admitting: Internal Medicine

## 2016-04-29 ENCOUNTER — Emergency Department (HOSPITAL_COMMUNITY): Payer: Medicare Other

## 2016-04-29 DIAGNOSIS — N390 Urinary tract infection, site not specified: Secondary | ICD-10-CM | POA: Diagnosis present

## 2016-04-29 DIAGNOSIS — J449 Chronic obstructive pulmonary disease, unspecified: Secondary | ICD-10-CM | POA: Diagnosis not present

## 2016-04-29 DIAGNOSIS — Z794 Long term (current) use of insulin: Secondary | ICD-10-CM

## 2016-04-29 DIAGNOSIS — E43 Unspecified severe protein-calorie malnutrition: Secondary | ICD-10-CM | POA: Insufficient documentation

## 2016-04-29 DIAGNOSIS — Z7951 Long term (current) use of inhaled steroids: Secondary | ICD-10-CM

## 2016-04-29 DIAGNOSIS — E785 Hyperlipidemia, unspecified: Secondary | ICD-10-CM | POA: Diagnosis present

## 2016-04-29 DIAGNOSIS — E1165 Type 2 diabetes mellitus with hyperglycemia: Secondary | ICD-10-CM | POA: Diagnosis present

## 2016-04-29 DIAGNOSIS — E1122 Type 2 diabetes mellitus with diabetic chronic kidney disease: Secondary | ICD-10-CM | POA: Diagnosis present

## 2016-04-29 DIAGNOSIS — F0390 Unspecified dementia without behavioral disturbance: Secondary | ICD-10-CM | POA: Diagnosis present

## 2016-04-29 DIAGNOSIS — Z79899 Other long term (current) drug therapy: Secondary | ICD-10-CM | POA: Diagnosis not present

## 2016-04-29 DIAGNOSIS — I129 Hypertensive chronic kidney disease with stage 1 through stage 4 chronic kidney disease, or unspecified chronic kidney disease: Secondary | ICD-10-CM | POA: Diagnosis present

## 2016-04-29 DIAGNOSIS — Z86718 Personal history of other venous thrombosis and embolism: Secondary | ICD-10-CM

## 2016-04-29 DIAGNOSIS — R0902 Hypoxemia: Secondary | ICD-10-CM | POA: Diagnosis present

## 2016-04-29 DIAGNOSIS — Z96642 Presence of left artificial hip joint: Secondary | ICD-10-CM | POA: Diagnosis present

## 2016-04-29 DIAGNOSIS — J441 Chronic obstructive pulmonary disease with (acute) exacerbation: Secondary | ICD-10-CM | POA: Diagnosis not present

## 2016-04-29 DIAGNOSIS — E113219 Type 2 diabetes mellitus with mild nonproliferative diabetic retinopathy with macular edema, unspecified eye: Secondary | ICD-10-CM | POA: Diagnosis present

## 2016-04-29 DIAGNOSIS — Z85828 Personal history of other malignant neoplasm of skin: Secondary | ICD-10-CM | POA: Diagnosis not present

## 2016-04-29 DIAGNOSIS — N183 Chronic kidney disease, stage 3 (moderate): Secondary | ICD-10-CM | POA: Diagnosis present

## 2016-04-29 DIAGNOSIS — Z66 Do not resuscitate: Secondary | ICD-10-CM | POA: Diagnosis present

## 2016-04-29 DIAGNOSIS — J440 Chronic obstructive pulmonary disease with acute lower respiratory infection: Secondary | ICD-10-CM | POA: Diagnosis not present

## 2016-04-29 DIAGNOSIS — Z681 Body mass index (BMI) 19 or less, adult: Secondary | ICD-10-CM

## 2016-04-29 DIAGNOSIS — J209 Acute bronchitis, unspecified: Secondary | ICD-10-CM | POA: Diagnosis present

## 2016-04-29 DIAGNOSIS — E118 Type 2 diabetes mellitus with unspecified complications: Secondary | ICD-10-CM | POA: Diagnosis not present

## 2016-04-29 DIAGNOSIS — J9601 Acute respiratory failure with hypoxia: Secondary | ICD-10-CM | POA: Diagnosis present

## 2016-04-29 DIAGNOSIS — R0600 Dyspnea, unspecified: Secondary | ICD-10-CM

## 2016-04-29 DIAGNOSIS — E119 Type 2 diabetes mellitus without complications: Secondary | ICD-10-CM

## 2016-04-29 DIAGNOSIS — M170 Bilateral primary osteoarthritis of knee: Secondary | ICD-10-CM | POA: Diagnosis present

## 2016-04-29 DIAGNOSIS — T380X5A Adverse effect of glucocorticoids and synthetic analogues, initial encounter: Secondary | ICD-10-CM | POA: Diagnosis present

## 2016-04-29 DIAGNOSIS — E1121 Type 2 diabetes mellitus with diabetic nephropathy: Secondary | ICD-10-CM | POA: Diagnosis present

## 2016-04-29 DIAGNOSIS — N400 Enlarged prostate without lower urinary tract symptoms: Secondary | ICD-10-CM | POA: Diagnosis present

## 2016-04-29 DIAGNOSIS — Z87891 Personal history of nicotine dependence: Secondary | ICD-10-CM | POA: Diagnosis not present

## 2016-04-29 LAB — BLOOD GAS, ARTERIAL
Acid-base deficit: 3.3 mmol/L — ABNORMAL HIGH (ref 0.0–2.0)
BICARBONATE: 21 mmol/L (ref 20.0–28.0)
DRAWN BY: 437071
FIO2: 24
O2 CONTENT: 2 L/min
O2 Saturation: 95.1 %
Patient temperature: 98.5
pCO2 arterial: 36 mmHg (ref 32.0–48.0)
pH, Arterial: 7.382 (ref 7.350–7.450)
pO2, Arterial: 78.5 mmHg — ABNORMAL LOW (ref 83.0–108.0)

## 2016-04-29 LAB — CBC WITH DIFFERENTIAL/PLATELET
BASOS ABS: 0.1 10*3/uL (ref 0.0–0.1)
Basophils Relative: 2 %
Eosinophils Absolute: 0.3 10*3/uL (ref 0.0–0.7)
Eosinophils Relative: 5 %
HEMATOCRIT: 29.3 % — AB (ref 39.0–52.0)
Hemoglobin: 9.5 g/dL — ABNORMAL LOW (ref 13.0–17.0)
LYMPHS PCT: 12 %
Lymphs Abs: 0.6 10*3/uL — ABNORMAL LOW (ref 0.7–4.0)
MCH: 30.5 pg (ref 26.0–34.0)
MCHC: 32.4 g/dL (ref 30.0–36.0)
MCV: 94.2 fL (ref 78.0–100.0)
MONO ABS: 0.4 10*3/uL (ref 0.1–1.0)
Monocytes Relative: 8 %
NEUTROS ABS: 3.8 10*3/uL (ref 1.7–7.7)
Neutrophils Relative %: 73 %
PLATELETS: 268 10*3/uL (ref 150–400)
RBC: 3.11 MIL/uL — AB (ref 4.22–5.81)
RDW: 13.8 % (ref 11.5–15.5)
WBC: 5.2 10*3/uL (ref 4.0–10.5)

## 2016-04-29 LAB — GLUCOSE, CAPILLARY: GLUCOSE-CAPILLARY: 204 mg/dL — AB (ref 65–99)

## 2016-04-29 LAB — PROTIME-INR
INR: 1.15
PROTHROMBIN TIME: 14.7 s (ref 11.4–15.2)

## 2016-04-29 LAB — I-STAT CHEM 8, ED
BUN: 38 mg/dL — ABNORMAL HIGH (ref 6–20)
CHLORIDE: 103 mmol/L (ref 101–111)
CREATININE: 2.1 mg/dL — AB (ref 0.61–1.24)
Calcium, Ion: 1.12 mmol/L — ABNORMAL LOW (ref 1.15–1.40)
Glucose, Bld: 170 mg/dL — ABNORMAL HIGH (ref 65–99)
HEMATOCRIT: 28 % — AB (ref 39.0–52.0)
HEMOGLOBIN: 9.5 g/dL — AB (ref 13.0–17.0)
POTASSIUM: 3.5 mmol/L (ref 3.5–5.1)
Sodium: 140 mmol/L (ref 135–145)
TCO2: 27 mmol/L (ref 0–100)

## 2016-04-29 LAB — BRAIN NATRIURETIC PEPTIDE: B NATRIURETIC PEPTIDE 5: 315 pg/mL — AB (ref 0.0–100.0)

## 2016-04-29 LAB — TROPONIN I: Troponin I: <0.03 ng/mL (ref <0.03)

## 2016-04-29 LAB — I-STAT TROPONIN, ED
TROPONIN I, POC: 0 ng/mL (ref 0.00–0.08)
TROPONIN I, POC: 0.01 ng/mL (ref 0.00–0.08)

## 2016-04-29 MED ORDER — ATORVASTATIN CALCIUM 20 MG PO TABS
20.0000 mg | ORAL_TABLET | Freq: Every day | ORAL | Status: DC
  Administered 2016-04-29 – 2016-05-04 (×5): 20 mg via ORAL
  Filled 2016-04-29 (×6): qty 1

## 2016-04-29 MED ORDER — DONEPEZIL HCL 5 MG PO TABS
5.0000 mg | ORAL_TABLET | Freq: Every day | ORAL | Status: DC
  Administered 2016-04-29 – 2016-05-03 (×4): 5 mg via ORAL
  Filled 2016-04-29 (×5): qty 1

## 2016-04-29 MED ORDER — IPRATROPIUM-ALBUTEROL 0.5-2.5 (3) MG/3ML IN SOLN
3.0000 mL | Freq: Once | RESPIRATORY_TRACT | Status: AC
  Administered 2016-04-29: 3 mL via RESPIRATORY_TRACT
  Filled 2016-04-29: qty 3

## 2016-04-29 MED ORDER — DOCUSATE SODIUM 100 MG PO CAPS
100.0000 mg | ORAL_CAPSULE | Freq: Two times a day (BID) | ORAL | Status: DC
  Administered 2016-04-29 – 2016-05-04 (×8): 100 mg via ORAL
  Filled 2016-04-29 (×9): qty 1

## 2016-04-29 MED ORDER — SODIUM CHLORIDE 0.9 % IV SOLN: 250.0000 mL | INTRAVENOUS | Status: DC | PRN

## 2016-04-29 MED ORDER — INSULIN ASPART 100 UNIT/ML ~~LOC~~ SOLN
0.0000 [IU] | Freq: Three times a day (TID) | SUBCUTANEOUS | Status: DC
  Administered 2016-04-30: 2 [IU] via SUBCUTANEOUS
  Administered 2016-04-30 (×2): 5 [IU] via SUBCUTANEOUS
  Administered 2016-05-01 (×2): 3 [IU] via SUBCUTANEOUS
  Administered 2016-05-01: 7 [IU] via SUBCUTANEOUS
  Administered 2016-05-02: 1 [IU] via SUBCUTANEOUS
  Administered 2016-05-02: 3 [IU] via SUBCUTANEOUS

## 2016-04-29 MED ORDER — LIDOCAINE-MENTHOL 4-1 % EX PTCH: 2.0000 | MEDICATED_PATCH | CUTANEOUS | Status: DC

## 2016-04-29 MED ORDER — TRAMADOL HCL 50 MG PO TABS
50.0000 mg | ORAL_TABLET | Freq: Four times a day (QID) | ORAL | Status: DC | PRN
  Administered 2016-04-29 – 2016-05-02 (×3): 50 mg via ORAL
  Filled 2016-04-29 (×3): qty 1

## 2016-04-29 MED ORDER — AZITHROMYCIN 250 MG PO TABS
250.0000 mg | ORAL_TABLET | Freq: Every day | ORAL | Status: DC
  Administered 2016-04-29 – 2016-05-04 (×5): 250 mg via ORAL
  Filled 2016-04-29 (×6): qty 1

## 2016-04-29 MED ORDER — FLUTICASONE PROPIONATE 50 MCG/ACT NA SUSP
2.0000 | Freq: Every day | NASAL | Status: DC
  Administered 2016-04-30 – 2016-05-04 (×5): 2 via NASAL
  Filled 2016-04-29: qty 16

## 2016-04-29 MED ORDER — CEFTRIAXONE SODIUM 1 G IJ SOLR
1.0000 g | INTRAMUSCULAR | Status: DC
  Administered 2016-04-29 – 2016-05-03 (×5): 1 g via INTRAVENOUS
  Filled 2016-04-29 (×7): qty 10

## 2016-04-29 MED ORDER — FERROUS SULFATE 325 (65 FE) MG PO TABS
325.0000 mg | ORAL_TABLET | Freq: Three times a day (TID) | ORAL | Status: DC
  Administered 2016-04-30 – 2016-05-04 (×10): 325 mg via ORAL
  Filled 2016-04-29 (×13): qty 1

## 2016-04-29 MED ORDER — LIDOCAINE 5 % EX PTCH
1.0000 | MEDICATED_PATCH | CUTANEOUS | Status: DC
  Administered 2016-04-29 – 2016-05-03 (×5): 1 via TRANSDERMAL
  Filled 2016-04-29 (×5): qty 1

## 2016-04-29 MED ORDER — VITAMIN D 1000 UNITS PO TABS
1000.0000 [IU] | ORAL_TABLET | Freq: Every day | ORAL | Status: DC
  Administered 2016-04-30 – 2016-05-04 (×4): 1000 [IU] via ORAL
  Filled 2016-04-29 (×5): qty 1

## 2016-04-29 MED ORDER — INSULIN DETEMIR 100 UNIT/ML ~~LOC~~ SOLN
8.0000 [IU] | Freq: Every day | SUBCUTANEOUS | Status: DC
  Administered 2016-04-29 – 2016-05-01 (×3): 8 [IU] via SUBCUTANEOUS
  Filled 2016-04-29 (×3): qty 0.08

## 2016-04-29 MED ORDER — TECHNETIUM TC 99M DIETHYLENETRIAME-PENTAACETIC ACID: 32.0000 | Freq: Once | INTRAVENOUS | Status: DC | PRN

## 2016-04-29 MED ORDER — LISINOPRIL 10 MG PO TABS
10.0000 mg | ORAL_TABLET | Freq: Every day | ORAL | Status: DC
  Administered 2016-04-29 – 2016-05-02 (×4): 10 mg via ORAL
  Filled 2016-04-29 (×4): qty 1

## 2016-04-29 MED ORDER — METOPROLOL TARTRATE 50 MG PO TABS
50.0000 mg | ORAL_TABLET | Freq: Two times a day (BID) | ORAL | Status: DC
  Administered 2016-04-29 – 2016-05-04 (×8): 50 mg via ORAL
  Filled 2016-04-29 (×10): qty 1

## 2016-04-29 MED ORDER — RISAQUAD PO CAPS
1.0000 | ORAL_CAPSULE | Freq: Every day | ORAL | Status: DC
  Administered 2016-04-30 – 2016-05-04 (×4): 1 via ORAL
  Filled 2016-04-29 (×5): qty 1

## 2016-04-29 MED ORDER — METHYLPREDNISOLONE SODIUM SUCC 125 MG IJ SOLR
125.0000 mg | Freq: Once | INTRAMUSCULAR | Status: AC
Start: 2016-04-29 — End: 2016-04-29
  Administered 2016-04-29: 125 mg via INTRAVENOUS
  Filled 2016-04-29: qty 2

## 2016-04-29 MED ORDER — ALBUTEROL SULFATE (2.5 MG/3ML) 0.083% IN NEBU: 2.5000 mg | INHALATION_SOLUTION | Freq: Four times a day (QID) | RESPIRATORY_TRACT | Status: DC | PRN

## 2016-04-29 MED ORDER — SODIUM CHLORIDE 0.9% FLUSH: 3.0000 mL | INTRAVENOUS | Status: DC | PRN

## 2016-04-29 MED ORDER — MELATONIN 3 MG PO TABS: 1.0000 | ORAL_TABLET | Freq: Every day | ORAL | Status: DC

## 2016-04-29 MED ORDER — METHYLPREDNISOLONE SODIUM SUCC 125 MG IJ SOLR
125.0000 mg | Freq: Four times a day (QID) | INTRAMUSCULAR | Status: DC
  Administered 2016-04-29 – 2016-04-30 (×4): 125 mg via INTRAVENOUS
  Filled 2016-04-29 (×4): qty 2

## 2016-04-29 MED ORDER — IPRATROPIUM-ALBUTEROL 0.5-2.5 (3) MG/3ML IN SOLN
3.0000 mL | Freq: Three times a day (TID) | RESPIRATORY_TRACT | Status: DC
  Administered 2016-04-30: 3 mL via RESPIRATORY_TRACT
  Filled 2016-04-29: qty 3

## 2016-04-29 MED ORDER — CLONIDINE HCL 0.1 MG/24HR TD PTWK
0.1000 mg | MEDICATED_PATCH | TRANSDERMAL | Status: DC
  Administered 2016-05-03: 0.1 mg via TRANSDERMAL
  Filled 2016-04-29: qty 1

## 2016-04-29 MED ORDER — ALBUTEROL SULFATE HFA 108 (90 BASE) MCG/ACT IN AERS: 2.0000 | INHALATION_SPRAY | Freq: Four times a day (QID) | RESPIRATORY_TRACT | Status: DC | PRN

## 2016-04-29 MED ORDER — FLUTICASONE FUROATE-VILANTEROL 200-25 MCG/INH IN AEPB
1.0000 | INHALATION_SPRAY | Freq: Every day | RESPIRATORY_TRACT | Status: DC
  Administered 2016-04-30 – 2016-05-03 (×4): 1 via RESPIRATORY_TRACT
  Filled 2016-04-29: qty 28

## 2016-04-29 MED ORDER — AMLODIPINE BESYLATE 10 MG PO TABS
10.0000 mg | ORAL_TABLET | Freq: Every day | ORAL | Status: DC
  Administered 2016-04-29 – 2016-05-04 (×5): 10 mg via ORAL
  Filled 2016-04-29 (×6): qty 1

## 2016-04-29 MED ORDER — SODIUM CHLORIDE 0.9 % IV SOLN
INTRAVENOUS | Status: DC
  Administered 2016-04-29 – 2016-04-30 (×2): via INTRAVENOUS

## 2016-04-29 MED ORDER — TECHNETIUM TO 99M ALBUMIN AGGREGATED
4.0000 | Freq: Once | INTRAVENOUS | Status: AC | PRN
  Administered 2016-04-29: 4 via INTRAVENOUS

## 2016-04-29 MED ORDER — SODIUM CHLORIDE 0.9% FLUSH
3.0000 mL | Freq: Two times a day (BID) | INTRAVENOUS | Status: DC
  Administered 2016-04-29 – 2016-05-04 (×8): 3 mL via INTRAVENOUS

## 2016-04-29 MED ORDER — SODIUM CHLORIDE 0.9% FLUSH
3.0000 mL | Freq: Two times a day (BID) | INTRAVENOUS | Status: DC
  Administered 2016-04-29 – 2016-05-04 (×7): 3 mL via INTRAVENOUS

## 2016-04-29 MED ORDER — ENOXAPARIN SODIUM 60 MG/0.6ML ~~LOC~~ SOLN
50.0000 mg | Freq: Every day | SUBCUTANEOUS | Status: DC
  Administered 2016-04-29 – 2016-05-03 (×4): 50 mg via SUBCUTANEOUS
  Filled 2016-04-29 (×6): qty 0.6

## 2016-04-29 MED ORDER — IPRATROPIUM-ALBUTEROL 0.5-2.5 (3) MG/3ML IN SOLN
3.0000 mL | Freq: Once | RESPIRATORY_TRACT | Status: AC
Start: 2016-04-29 — End: 2016-04-29
  Administered 2016-04-29: 3 mL via RESPIRATORY_TRACT
  Filled 2016-04-29: qty 3

## 2016-04-29 MED ORDER — IPRATROPIUM-ALBUTEROL 0.5-2.5 (3) MG/3ML IN SOLN
3.0000 mL | Freq: Four times a day (QID) | RESPIRATORY_TRACT | Status: DC
  Administered 2016-04-29: 3 mL via RESPIRATORY_TRACT
  Filled 2016-04-29: qty 3

## 2016-04-29 MED ORDER — ACETAMINOPHEN 500 MG PO TABS: 500.0000 mg | ORAL_TABLET | Freq: Four times a day (QID) | ORAL | Status: DC | PRN

## 2016-04-29 NOTE — ED Notes (Signed)
Pt placed on 2L [place], O2-94%

## 2016-04-29 NOTE — ED Provider Notes (Signed)
[facility]-EMERGENCY DEPT Provider Note   CSN: [id number] Arrival date & time: 04/29/16  1019     History   Chief Complaint Chief Complaint  Patient presents with  . Shortness of Breath    HPI Patrick Morrison is a 83 y.o. male.  HPI Patient presents with reported shortness of breath. Reportedly has sats in the 80s today. Patient states he does not remember feeling short of breath. Awake and pleasant. Has a left hip hematoma that he was in the hospital for around 3 weeks ago. However he was found out to have a DVT in his left thigh around 2 days ago. He is on Lovenox. Denies chest pain. Denies shortness of breath but reportedly was short of breath earlier today. When asked how the hematoma of his hip is doing he said "you tell me". No fevers or chills.   Past Medical History:  Diagnosis Date  . Abnormal echocardiogram   . Asymptomatic bilateral carotid artery stenosis   . Bilateral knee pain   . BPH (benign prostatic hyperplasia)   . Bruit of right carotid artery   . Chronic rhinitis   . COPD (chronic obstructive pulmonary disease) (HCC)   . Diabetic macular edema (HCC)   . DM (diabetes mellitus) (HCC)   . Dysplasia of prostate   . Edema   . Hyperlipidemia   . Hypertension   . NPDR (nonproliferative diabetic retinopathy) (HCC)   . Osteoarthritis   . Personal history of other malignant neoplasm of skin   . Primary osteoarthritis of both knees   . Pseudophakia of both eyes     Patient Active Problem List   Diagnosis Date Noted  . Hip hematoma, left, initial encounter 04/03/2016  . Closed left hip fracture, initial encounter (HCC) 02/09/2016    Past Surgical History:  Procedure Laterality Date  . ANKLE SURGERY    . COLECTOMY    . COLON SURGERY    . HIP ARTHROPLASTY Left 02/10/2016   Procedure: ARTHROPLASTY BIPOLAR HIP (HEMIARTHROPLASTY);  Surgeon: Christena Flake, MD;  Location: [facility] ORS;  Service: Orthopedics;  Laterality: Left;       Home Medications    Prior to  Admission medications   Medication Sig Start Date End Date Taking? Authorizing Provider  acetaminophen (TYLENOL) 500 MG tablet Take 500 mg by mouth every 6 (six) hours as needed for mild pain or fever.    Yes Historical Provider, MD  acidophilus (RISAQUAD) CAPS capsule Take 1 capsule by mouth daily.    Yes Historical Provider, MD  albuterol (PROAIR HFA) 108 (90 Base) MCG/ACT inhaler Inhale 2 puffs into the lungs every 6 (six) hours as needed for wheezing or shortness of breath.  03/26/14  Yes Historical Provider, MD  amLODipine (NORVASC) 10 MG tablet Take 1 tablet (10 mg total) by mouth daily. 02/13/16  Yes Sital Mody, MD  atorvastatin (LIPITOR) 20 MG tablet Take 20 mg by mouth daily.   Yes Historical Provider, MD  budesonide-formoterol (SYMBICORT) 160-4.5 MCG/ACT inhaler Inhale 2 puffs into the lungs 2 (two) times daily.   Yes Historical Provider, MD  Cholecalciferol (VITAMIN D3) 2000 units capsule Take 1 capsule by mouth daily.   Yes Historical Provider, MD  cloNIDine (CATAPRES - DOSED IN MG/24 HR) 0.1 mg/24hr patch Place 0.1 mg onto the skin once a week.   Yes Historical Provider, MD  docusate sodium (COLACE) 100 MG capsule Take 1 capsule (100 mg total) by mouth 2 (two) times daily. 02/13/16  Yes Adrian Saran, MD  donepezil (  ARICEPT) 5 MG tablet Take 5 mg by mouth at bedtime.   Yes Historical Provider, MD  enoxaparin (LOVENOX) 30 MG/0.3ML injection Inject 0.3 mLs (30 mg total) into the skin every 12 (twelve) hours. 04/27/16  Yes Dinah C Ngetich, NP  ferrous sulfate 325 (65 FE) MG tablet Take 1 tablet (325 mg total) by mouth 3 (three) times daily after meals. 02/13/16  Yes Sital Mody, MD  fluticasone (FLONASE) 50 MCG/ACT nasal spray Place 2 sprays into both nostrils daily.  01/12/15  Yes Historical Provider, MD  insulin detemir (LEVEMIR) 100 UNIT/ML injection Inject 8 Units into the skin at bedtime.    Yes Historical Provider, MD  insulin lispro (HUMALOG) 100 UNIT/ML injection Inject 5 Units into the  skin 3 (three) times daily before meals.   Yes Historical Provider, MD  Lidocaine-Menthol (ICY HOT LIDOCAINE PLUS MENTHOL) 4-1 % PTCH Apply 2 patches topically every morning. To left hip remove after 12 hours   Yes Historical Provider, MD  lisinopril (PRINIVIL,ZESTRIL) 10 MG tablet Take 10 mg by mouth daily. 03/25/16  Yes Historical Provider, MD  loperamide (IMODIUM) 2 MG capsule Take 2 mg by mouth daily as needed for diarrhea or loose stools.    Yes Historical Provider, MD  Melatonin 3 MG TABS Take 1 tablet by mouth at bedtime. 02/25/15  Yes Historical Provider, MD  metoprolol (LOPRESSOR) 50 MG tablet Take 1 tablet (50 mg total) by mouth 2 (two) times daily. 02/13/16  Yes Adrian Saran, MD  traMADol (ULTRAM) 50 MG tablet Take 50 mg by mouth every 6 (six) hours as needed for moderate pain.   Yes Historical Provider, MD  triamcinolone cream (KENALOG) 0.1 % Apply 1 application topically 2 (two) times daily.   Yes Historical Provider, MD    Family History Family History  Problem Relation Age of Onset  . Cancer Father   . Stroke Father     Social History Social History  Substance Use Topics  . Smoking status: Former Smoker    Packs/day: 1.00    Years: 60.00    Types: Cigarettes    Quit date: 11/24/2003  . Smokeless tobacco: Never Used  . Alcohol use No     Comment: occasional beer     Allergies   Patient has no known allergies.   Review of Systems Review of Systems  Constitutional: Negative for appetite change.  HENT: Negative for congestion.   Eyes: Negative for pain.  Respiratory: Positive for shortness of breath.   Cardiovascular: Positive for leg swelling. Negative for chest pain.  Gastrointestinal: Negative for abdominal pain.  Genitourinary: Negative for flank pain.  Musculoskeletal: Negative for back pain.  Neurological: Negative for numbness.  Hematological: Bruises/bleeds easily.  Psychiatric/Behavioral: Negative for confusion.     Physical Exam Updated Vital  Signs BP 189/86   Pulse (!) 58   Temp 98.6 F (37 C) (Oral)   Resp 19   SpO2 93%   Physical Exam  Constitutional: He appears well-developed.  HENT:  Head: Atraumatic.  Eyes: EOM are normal.  Neck: Neck supple.  Cardiovascular: Normal rate.   Pulmonary/Chest: Effort normal. No respiratory distress.  Equal breath sounds bilaterally. On room air and not hypoxic. Slight wheezing.  Abdominal: There is no tenderness.  Musculoskeletal:  Left lower leg is in knee immobilizer. Some edema.  Neurological: He is alert.  Skin: Skin is warm.     ED Treatments / Results  Labs (all labs ordered are listed, but only abnormal results are displayed) Labs Reviewed  CBC WITH DIFFERENTIAL/PLATELET - Abnormal; Notable for the following:       Result Value   RBC 3.11 (*)    Hemoglobin 9.5 (*)    HCT 29.3 (*)    Lymphs Abs 0.6 (*)    All other components within normal limits  I-STAT CHEM 8, ED - Abnormal; Notable for the following:    BUN 38 (*)    Creatinine, Ser 2.10 (*)    Glucose, Bld 170 (*)    Calcium, Ion 1.12 (*)    Hemoglobin 9.5 (*)    HCT 28.0 (*)    All other components within normal limits  Beverlee NimsROTIME-INR  I-STAT TROPOININ, ED    EKG  EKG Interpretation  Date/Time:  Friday April 29 2016 10:27:09 EST Ventricular Rate:  58 PR Interval:    QRS Duration: 102 QT Interval:  445 QTC Calculation: 438 R Axis:   -65 Text Interpretation:  Sinus rhythm Atrial premature complex LAD, consider left anterior fascicular block Low voltage, precordial leads Confirmed by Rubin PayorPICKERING  MD, Conor Filsaime [phone]([id number]) on 04/29/2016 10:46:00 AM       Radiology Dg Chest 2 View  Result Date: 04/29/2016 CLINICAL DATA:  Shortness of Breath EXAM: CHEST  2 VIEW COMPARISON:  02/11/2016 FINDINGS: There is hyperinflation of the lungs compatible with COPD. Heart and mediastinal contours are within normal limits. No focal opacities or effusions. No acute bony abnormality. IMPRESSION: COPD.  No active disease.  Electronically Signed   By: Charlett NoseKevin  Dover M.D.   On: 04/29/2016 11:25    Procedures Procedures (including critical care time)  Medications Ordered in ED Medications - No data to display   Initial Impression / Assessment and Plan / ED Course  I have reviewed the triage vital signs and the nursing notes.  Pertinent labs & imaging results that were available during my care of the patient were reviewed by me and considered in my medical decision making (see chart for details).     Patient with reported dyspnea and hypoxia. Feels much better now. Not hypoxic here. He does however have a recent DVT in his left thigh. He is artery on anticoagulation. Creatinine is elevated but at baseline. Will get VQ scan to evaluate for pulmonary embolism. If negative likely discharge home. Care turned over to Dr. Silverio LayYao.  Final  Clinical Impressions(s) / ED Diagnoses   Final diagnoses:  Dyspnea, unspecified type    New Prescriptions New Prescriptions   No medications on file     Benjiman CoreNathan Carolyna Yerian, MD 04/29/16 1552

## 2016-04-29 NOTE — ED Notes (Signed)
Report given to Erica,RN on receiving floor 3E; concerns about BP discussed; pt has hx of HTN with medications rx; BP taken at 8pm had decreased from most resent BP's from earlier in the day; On coming RN was not given any report from prior RN of BP concerns or issues addressed thought out the day; RN spoke with charge RN who reviewed pt's chart and pt is to continue 3E;

## 2016-04-29 NOTE — ED Notes (Addendum)
Pt to xray

## 2016-04-29 NOTE — ED Notes (Signed)
Pt want wear the mask for the breathing treatment. Me and his family have tried to assist him and he just will not cooperate

## 2016-04-29 NOTE — H&P (Signed)
History and Physical    Patrick Morrison:[id number] DOB: [birth year] DOA: 04/29/2016  Referring MD/NP/PA: EDP PCP: Lynnea Ferrier, MD  Outpatient Specialists:  Patient coming from:   Chief Complaint: Shortness of breath  HPI: Patrick Morrison is a 83 y.o. male with medical history significant of COPD, diabetes mellitus, hypertension, hyperlipidemia and dementia. He was recently discharged from [facility] on 04/08/2006 for left traumatic hip injury. He was also recently started on Lovenox 2 days ago for left lower extremity DVT.  He presented to:[facility] ED with shortness of breath and cough without fever or chills. Denied chest pain, diaphoresis admitted to chest congestion.  ED Course: Patient was noted to be hypoxic with mild wheezing at the ED and underwent VQ scan which was read as showing heterogeneous VQ pattern without matched deffect c/w  COPD. Chest x-ray was negative for acute infiltrate or airspace disease.  Patient admitted for COPD with acute bronchitis  Review of Systems: As per HPI otherwise 10 point review of systems negative.  Past Medical History:  Diagnosis Date  . Abnormal echocardiogram   . Asymptomatic bilateral carotid artery stenosis   . Bilateral knee pain   . BPH (benign prostatic hyperplasia)   . Bruit of right carotid artery   . Chronic rhinitis   . COPD (chronic obstructive pulmonary disease) (HCC)   . Diabetic macular edema (HCC)   . DM (diabetes mellitus) (HCC)   . Dysplasia of prostate   . Edema   . Hyperlipidemia   . Hypertension   . NPDR (nonproliferative diabetic retinopathy) (HCC)   . Osteoarthritis   . Personal history of other malignant neoplasm of skin   . Primary osteoarthritis of both knees   . Pseudophakia of both eyes     Past Surgical History:  Procedure Laterality Date  . ANKLE SURGERY    . COLECTOMY    . COLON SURGERY    . HIP ARTHROPLASTY Left 02/10/2016   Procedure: ARTHROPLASTY BIPOLAR HIP  (HEMIARTHROPLASTY);  Surgeon: Christena Flake, MD;  Location: [facility] ORS;  Service: Orthopedics;  Laterality: Left;     reports that he quit smoking about 12 years ago. His smoking use included Cigarettes. He has a 60.00 pack-year smoking history. He has never used smokeless tobacco. He reports that he does not drink alcohol or use drugs.  No Known Allergies  Family History  Problem Relation Age of Onset  . Cancer Father   . Stroke Father     Prior to Admission medications   Medication Sig Start Date End Date Taking? Authorizing Provider  acetaminophen (TYLENOL) 500 MG tablet Take 500 mg by mouth every 6 (six) hours as needed for mild pain or fever.    Yes Historical Provider, MD  acidophilus (RISAQUAD) CAPS capsule Take 1 capsule by mouth daily.    Yes Historical Provider, MD  albuterol (PROAIR HFA) 108 (90 Base) MCG/ACT inhaler Inhale 2 puffs into the lungs every 6 (six) hours as needed for wheezing or shortness of breath.  03/26/14  Yes Historical Provider, MD  amLODipine (NORVASC) 10 MG tablet Take 1 tablet (10 mg total) by mouth daily. 02/13/16  Yes Sital Mody, MD  atorvastatin (LIPITOR) 20 MG tablet Take 20 mg by mouth daily.   Yes Historical Provider, MD  budesonide-formoterol (SYMBICORT) 160-4.5 MCG/ACT inhaler Inhale 2 puffs into the lungs 2 (two) times daily.   Yes Historical Provider, MD  Cholecalciferol (VITAMIN D3) 2000 units capsule Take 1 capsule by mouth daily.  Yes Historical Provider, MD  cloNIDine (CATAPRES - DOSED IN MG/24 HR) 0.1 mg/24hr patch Place 0.1 mg onto the skin once a week.   Yes Historical Provider, MD  docusate sodium (COLACE) 100 MG capsule Take 1 capsule (100 mg total) by mouth 2 (two) times daily. 02/13/16  Yes Sital Mody, MD  donepezil (ARICEPT) 5 MG tablet Take 5 mg by mouth at bedtime.   Yes Historical Provider, MD  enoxaparin (LOVENOX) 30 MG/0.3ML injection Inject 0.3 mLs (30 mg total) into the skin every 12 (twelve) hours. 04/27/16  Yes Dinah C Ngetich, NP    ferrous sulfate 325 (65 FE) MG tablet Take 1 tablet (325 mg total) by mouth 3 (three) times daily after meals. 02/13/16  Yes Sital Mody, MD  fluticasone (FLONASE) 50 MCG/ACT nasal spray Place 2 sprays into both nostrils daily.  01/12/15  Yes Historical Provider, MD  insulin detemir (LEVEMIR) 100 UNIT/ML injection Inject 8 Units into the skin at bedtime.    Yes Historical Provider, MD  insulin lispro (HUMALOG) 100 UNIT/ML injection Inject 5 Units into the skin 3 (three) times daily before meals.   Yes Historical Provider, MD  Lidocaine-Menthol (ICY HOT LIDOCAINE PLUS MENTHOL) 4-1 % PTCH Apply 2 patches topically every morning. To left hip remove after 12 hours   Yes Historical Provider, MD  lisinopril (PRINIVIL,ZESTRIL) 10 MG tablet Take 10 mg by mouth daily. 03/25/16  Yes Historical Provider, MD  loperamide (IMODIUM) 2 MG capsule Take 2 mg by mouth daily as needed for diarrhea or loose stools.    Yes Historical Provider, MD  Melatonin 3 MG TABS Take 1 tablet by mouth at bedtime. 02/25/15  Yes Historical Provider, MD  metoprolol (LOPRESSOR) 50 MG tablet Take 1 tablet (50 mg total) by mouth 2 (two) times daily. 02/13/16  Yes Adrian SaranSital Mody, MD  traMADol (ULTRAM) 50 MG tablet Take 50 mg by mouth every 6 (six) hours as needed for moderate pain.   Yes Historical Provider, MD  triamcinolone cream (KENALOG) 0.1 % Apply 1 application topically 2 (two) times daily.   Yes Historical Provider, MD    Physical Exam: Vitals:   04/29/16 1630 04/29/16 1655 04/29/16 1730 04/29/16 2000  BP: 190/77  181/59 173/73  Pulse: (!) 55  60 62  Resp:    23  Temp:      TempSrc:      SpO2: 91% (!) 86% 98% (!) 89%      Constitutional: NAD, calm, comfortable Vitals:   04/29/16 1630 04/29/16 1655 04/29/16 1730 04/29/16 2000  BP: 190/77  181/59 173/73  Pulse: (!) 55  60 62  Resp:    23  Temp:      TempSrc:      SpO2: 91% (!) 86% 98% (!) 89%   Eyes: PERRL, lids and conjunctivae normal ENMT: Mucous membranes are moist.  Posterior pharynx clear of any exudate or lesions.  Neck: normal, supple, no masses, no thyromegaly Respiratory: Diminished breath sounds with Bilateral occasional wheezing. Normal respiratory effort. No accessory muscle use.  Cardiovascular: Regular rate and rhythm, no murmurs / rubs / gallops. No extremity edema. 2+ pedal pulses. No carotid bruits.  Abdomen: no tenderness, no masses palpated. No hepatosplenomegaly. Bowel sounds positive.  Musculoskeletal: no clubbing / cyanosis. No joint deformity upper and lower extremities. Good ROM, no contractures. Normal muscle tone.  Skin: no rashes, lesions, ulcers. No induration Neurologic: Alert and responsive, no obvious acute focal deficits Psychiatric: Normal judgment and insight. Normal mood.   Labs on Admission:  I have personally reviewed following labs and imaging studies  CBC:  Recent Labs Lab 04/29/16 1034 04/29/16 1051  WBC 5.2  --   NEUTROABS 3.8  --   HGB 9.5* 9.5*  HCT 29.3* 28.0*  MCV 94.2  --   PLT 268  --    Basic Metabolic Panel:  Recent Labs Lab 04/29/16 1051  NA 140  K 3.5  CL 103  GLUCOSE 170*  BUN 38*  CREATININE 2.10*   GFR: Estimated Creatinine Clearance: 20.7 mL/min (by C-G formula based on SCr of 2.1 mg/dL (H)). Liver Function Tests: No results for input(s): AST, ALT, ALKPHOS, BILITOT, PROT, ALBUMIN in the last 168 hours. No results for input(s): LIPASE, AMYLASE in the last 168 hours. No results for input(s): AMMONIA in the last 168 hours. Coagulation Profile:  Recent Labs Lab 04/29/16 1042  INR 1.15   Cardiac Enzymes: No results for input(s): CKTOTAL, CKMB, CKMBINDEX, TROPONINI in the last 168 hours. BNP (last 3 results) No results for input(s): PROBNP in the last 8760 hours. HbA1C: No results for input(s): HGBA1C in the last 72 hours. CBG: No results for input(s): GLUCAP in the last 168 hours. Lipid Profile: No results for input(s): CHOL, HDL, LDLCALC, TRIG, CHOLHDL, LDLDIRECT in the last  72 hours. Thyroid Function Tests: No results for input(s): TSH, T4TOTAL, FREET4, T3FREE, THYROIDAB in the last 72 hours. Anemia Panel: No results for input(s): VITAMINB12, FOLATE, FERRITIN, TIBC, IRON, RETICCTPCT in the last 72 hours. Urine analysis:    Component Value Date/Time   COLORURINE YELLOW (A) 04/03/2016 1153   APPEARANCEUR HAZY (A) 04/03/2016 1153   APPEARANCEUR Clear 07/18/2012 2128   LABSPEC 1.013 04/03/2016 1153   LABSPEC 1.013 07/18/2012 2128   PHURINE 5.0 04/03/2016 1153   GLUCOSEU 50 (A) 04/03/2016 1153   GLUCOSEU Negative 07/18/2012 2128   HGBUR NEGATIVE 04/03/2016 1153   BILIRUBINUR NEGATIVE 04/03/2016 1153   BILIRUBINUR Negative 07/18/2012 2128   KETONESUR NEGATIVE 04/03/2016 1153   PROTEINUR >=300 (A) 04/03/2016 1153   NITRITE NEGATIVE 04/03/2016 1153   LEUKOCYTESUR MODERATE (A) 04/03/2016 1153   LEUKOCYTESUR 2+ 07/18/2012 2128   Sepsis Labs: @LABRCNTIP (procalcitonin:4,lacticidven:4) )No results found for this or any previous visit (from the past 240 hour(s)).   Radiological Exams on Admission: Dg Chest 2 View  Result Date: 04/29/2016 CLINICAL DATA:  Shortness of Breath EXAM: CHEST  2 VIEW COMPARISON:  02/11/2016 FINDINGS: There is hyperinflation of the lungs compatible with COPD. Heart and mediastinal contours are within normal limits. No focal opacities or effusions. No acute bony abnormality. IMPRESSION: COPD.  No active disease. Electronically Signed   By: Charlett Nose M.D.   On: 04/29/2016 11:25   Nm Pulmonary Perf And Vent  Result Date: 04/29/2016 CLINICAL DATA:  Short of breath.  DVT. EXAM: NUCLEAR MEDICINE VENTILATION - PERFUSION LUNG SCAN TECHNIQUE: Ventilation images were obtained in multiple projections using inhaled aerosol Tc-57m DTPA. Perfusion images were obtained in multiple projections after intravenous injection of Tc-54m MAA. RADIOPHARMACEUTICALS:  Thirty-one mCi Technetium-49m DTPA aerosol inhalation and 4.2 mCi Technetium-18m MAA IV  COMPARISON:  Radiograph 04/29/2016 FINDINGS: Ventilation: Extremely heterogeneous and patchy ventilation pattern within LEFT RIGHT lung. Focal decreased ventilation to the RIGHT upper lobe. Perfusion: Decrease regional perfusion to the RIGHT upper lobe matches the ventilation defect. Smaller defect within the lingula is also match. No un matched wedge-shaped peripheral perfusion defects to localize pulmonary emboli. IMPRESSION: Heterogeneous ventilation and profusion pattern with out matched defects is most consistent COPD pattern. No evidence of acute pulmonary  embolism. Electronically Signed   By: Genevive Bi M.D.   On: 04/29/2016 16:52    EKG: Independently reviewed.  Assessment/Plan Principal Problem:   Hypoxia Active Problems:   DM2 (diabetes mellitus, type 2) (HCC)   COPD (chronic obstructive pulmonary disease) (HCC)  #1. Hypoxia:  Patient with recent DVT on Lovenox started recently. CT angiogram not done due to renal dysfunction -  VQ scan heterogeneous V/Q pattern consistent with COPD (low probability) Objective wheezing on exam with chest x-ray negative for acute infiltrate/airspace disease COPD likely with acute bronchitis. Cardiac enzymes, BNP  #2 COPD exacerbation due to acute bronchitis: IV steroids, antibiotics, incentive spirometry, PT/OT. ABG  #3 UTI:  Antibiotic, culture urine  #4 left lower strength to DVT:  Post orthopedic surgery - continue Lovenox coverage. Monitor renal function   #5 recent left Hip Injury:  Continue analgesia, bowel regimen. Therapy.  #6 diabetes mellitus:  Insulin coverage, CBG monitoring Check A1c  DVT prophylaxis: (Lovenox) Code Status:  (DNR) Family Communication: Elia, [place] at bedside [facility] # [phone]) Disposition Plan: SNF Consults called:  Admission status:(inpatient / tele )   OSEI-BONSU,Toyoko Silos MD [facility] Pager [phone]  If 7PM-7AM, please contact night-coverage [email or web address] Password  [facility]  04/29/2016, 8:13 PM

## 2016-04-29 NOTE — ED Triage Notes (Signed)
Per ems, pt from [facility], staff called 911 due to pt being SOB and saying sats were 80s. EMS saturations were 94% on RA, pt denies any complaints at this time, breath sounds clear, ekg unremakrable.

## 2016-04-29 NOTE — ED Notes (Signed)
When pt arrived to room pt's sats in upper 80s with no respiratory distress; family at bedside; pt was off O2; Family and pt was advised that pt needs to continue to wear O2 or he will continue to drop in sats;  Nasal canula replaced on pt and sats raised to 95%; Pt a&ox 4 on assessment for on coming RN

## 2016-04-29 NOTE — ED Notes (Signed)
Pt transported to [facility] 

## 2016-04-29 NOTE — ED Provider Notes (Signed)
  Physical Exam  BP 181/59   Pulse 60   Temp 98.6 F (37 C) (Oral)   Resp 19   SpO2 98%   Physical Exam  ED Course  Procedures  MDM Patient care assumed at 4pm. Patient was hypoxic earlier today. Had some shortness of breath and started on lovenox 2 days for DVT. Patient's Cr at baseline is 2.1. VQ scan ordered and pending. VQ showed COPD. Desat to 86% on RA. Given 2 nebs, steroids. Will admit for hypoxia from COPD.       Charlynne Panderavid Hsienta Halei Hanover, MD 04/29/16 [phone]

## 2016-04-30 DIAGNOSIS — E43 Unspecified severe protein-calorie malnutrition: Secondary | ICD-10-CM | POA: Insufficient documentation

## 2016-04-30 LAB — PROTIME-INR
INR: 1.12
PROTHROMBIN TIME: 14.4 s (ref 11.4–15.2)

## 2016-04-30 LAB — BASIC METABOLIC PANEL
Anion gap: 12 (ref 5–15)
BUN: 37 mg/dL — ABNORMAL HIGH (ref 6–20)
CALCIUM: 8.2 mg/dL — AB (ref 8.9–10.3)
CO2: 23 mmol/L (ref 22–32)
CREATININE: 2.13 mg/dL — AB (ref 0.61–1.24)
Chloride: 100 mmol/L — ABNORMAL LOW (ref 101–111)
GFR calc Af Amer: 32 mL/min — ABNORMAL LOW (ref >60)
GFR calc non Af Amer: 28 mL/min — ABNORMAL LOW (ref >60)
Glucose, Bld: 201 mg/dL — ABNORMAL HIGH (ref 65–99)
Potassium: 4.1 mmol/L (ref 3.5–5.1)
SODIUM: 135 mmol/L (ref 135–145)

## 2016-04-30 LAB — CBC
HCT: 32 % — ABNORMAL LOW (ref 39.0–52.0)
Hemoglobin: 10.5 g/dL — ABNORMAL LOW (ref 13.0–17.0)
MCH: 30.7 pg (ref 26.0–34.0)
MCHC: 32.8 g/dL (ref 30.0–36.0)
MCV: 93.6 fL (ref 78.0–100.0)
PLATELETS: 292 10*3/uL (ref 150–400)
RBC: 3.42 MIL/uL — AB (ref 4.22–5.81)
RDW: 13.7 % (ref 11.5–15.5)
WBC: 4.2 10*3/uL (ref 4.0–10.5)

## 2016-04-30 LAB — APTT: aPTT: 52 seconds — ABNORMAL HIGH (ref 24–36)

## 2016-04-30 LAB — GLUCOSE, CAPILLARY
GLUCOSE-CAPILLARY: 241 mg/dL — AB (ref 65–99)
Glucose-Capillary: 179 mg/dL — ABNORMAL HIGH (ref 65–99)
Glucose-Capillary: 291 mg/dL — ABNORMAL HIGH (ref 65–99)
Glucose-Capillary: 271 mg/dL — ABNORMAL HIGH (ref 65–99)

## 2016-04-30 LAB — TROPONIN I: Troponin I: <0.03 ng/mL (ref <0.03)

## 2016-04-30 MED ORDER — METHYLPREDNISOLONE SODIUM SUCC 40 MG IJ SOLR
40.0000 mg | Freq: Two times a day (BID) | INTRAMUSCULAR | Status: DC
  Administered 2016-05-01 – 2016-05-02 (×3): 40 mg via INTRAVENOUS
  Filled 2016-04-30 (×3): qty 1

## 2016-04-30 MED ORDER — ENSURE ENLIVE PO LIQD
237.0000 mL | Freq: Two times a day (BID) | ORAL | Status: DC
Start: 2016-04-30 — End: 2016-04-30

## 2016-04-30 MED ORDER — IPRATROPIUM-ALBUTEROL 0.5-2.5 (3) MG/3ML IN SOLN
3.0000 mL | Freq: Four times a day (QID) | RESPIRATORY_TRACT | Status: DC
  Administered 2016-04-30 – 2016-05-01 (×3): 3 mL via RESPIRATORY_TRACT
  Filled 2016-04-30 (×3): qty 3

## 2016-04-30 MED ORDER — IPRATROPIUM-ALBUTEROL 0.5-2.5 (3) MG/3ML IN SOLN: 3.0000 mL | Freq: Two times a day (BID) | RESPIRATORY_TRACT | Status: DC

## 2016-04-30 MED ORDER — ENSURE ENLIVE PO LIQD
237.0000 mL | Freq: Three times a day (TID) | ORAL | Status: DC
  Administered 2016-04-30 – 2016-05-04 (×11): 237 mL via ORAL

## 2016-04-30 MED ORDER — ALBUTEROL SULFATE (2.5 MG/3ML) 0.083% IN NEBU: 2.5000 mg | INHALATION_SOLUTION | RESPIRATORY_TRACT | Status: DC | PRN

## 2016-04-30 NOTE — Progress Notes (Addendum)
Patient ID: Patrick Morrison, male   DOB: February 07, 1934, 83 y.o.   MRN: [id number]    PROGRESS NOTE  NIKOLAJ GERAGHTY  WUJ:[id number] DOB: [birth year] DOA: 04/29/2016  PCP: Lynnea Ferrier, MD   Brief Narrative:  83 y.o. male with medical history significant of COPD, diabetes mellitus, hypertension, hyperlipidemia and dementia, recently discharged from [facility] on 04/08/2006 for left traumatic hip injury. He was also recently started on Lovenox 2 days prior to this admission, for left lower extremity DVT. Pt presented [facility] ED with shortness of breath and cough without fever or chills. Patient admitted for COPD with acute bronchitis  Assessment & Plan:   Principal Problem:   Hypoxia - secondary to acute COPD exacerbation - still on solumedrol Q6 hours, less wheezing on exam this AM, will taper down - continue empiric Zithromax and Rocephin day #2 - attempt to taper off oxygen  - continue BD's scheduled and as needed   Active Problems:   DM2 (diabetes mellitus, type 2) (HCC) with complications of nephropathy  - keep on Insulin Levemir - also on SSI for now     CKD stage III - Cr in January was ~ 2.6 - currently at baseline - BMP In AM    Recent DVT - continue with Lovenox     HTN, essential - continue home medical regimen     Protein-calorie malnutrition, severe - appreciate nutritionist consultation    DVT prophylaxis: Lovenox SQ Code Status: DNR Family Communication: Patient at bedside  Disposition Plan: To be determined, PT eval done, SNF recommended   Consultants:   None  Procedures:   None  Antimicrobials:   Zithromax 2/09 -->  Rocephin 2/09 -->   Subjective: No events overnight.   Objective: Vitals:   04/30/16 0823 04/30/16 0826 04/30/16 1230 04/30/16 1400  BP:   (!) 176/52 140/80  Pulse:   62   Resp:   18   Temp:   98.2 F (36.8 C)   TempSrc:   Oral   SpO2: 96% 96% 94%   Weight:      Height:        Intake/Output Summary  (Last 24 hours) at 04/30/16 1626 Last data filed at 04/30/16 0302  Gross per 24 hour  Intake              503 ml  Output                0 ml  Net              503 ml   Filed Weights   04/29/16 2042 04/30/16 0612  Weight: 51.5 kg (113 lb 9.6 oz) 52.6 kg (116 lb)    Examination:  General exam: Appears calm and comfortable  Respiratory system: Course breath sounds with exp wheezing noted  Cardiovascular system: S1 & S2 heard, RRR. No JVD, murmurs, rubs, gallops or clicks. Gastrointestinal system: Abdomen is nondistended, soft and nontender. No organomegaly or masses felt. Normal bowel sounds heard. Central nervous system: Alert and oriented. No focal neurological deficits.   Data Reviewed: I have personally reviewed following labs and imaging studies  CBC:  Recent Labs Lab 04/29/16 1034 04/29/16 1051 04/30/16 0341  WBC 5.2  --  4.2  NEUTROABS 3.8  --   --   HGB 9.5* 9.5* 10.5*  HCT 29.3* 28.0* 32.0*  MCV 94.2  --  93.6  PLT 268  --  292   Basic Metabolic Panel:  Recent Labs Lab  04/29/16 1051 04/30/16 0341  NA 140 135  K 3.5 4.1  CL 103 100*  CO2  --  23  GLUCOSE 170* 201*  BUN 38* 37*  CREATININE 2.10* 2.13*  CALCIUM  --  8.2*   Coagulation Profile:  Recent Labs Lab 04/29/16 1042 04/30/16 0341  INR 1.15 1.12   Cardiac Enzymes:  Recent Labs Lab 04/29/16 2120 04/30/16 0341 04/30/16 1025  TROPONINI <0.03 <0.03 <0.03   CBG:  Recent Labs Lab 04/29/16 2128 04/30/16 0736 04/30/16 1245  GLUCAP 204* 179* 291*   Radiology Studies: Dg Chest 2 View Result Date: 04/29/2016 COPD.  No active disease.  Nm Pulmonary Perf And Vent Result Date: 04/29/2016 Heterogeneous ventilation and profusion pattern with out matched defects is most consistent COPD pattern. No evidence of acute pulmonary embolism.  Scheduled Meds: . acidophilus  1 capsule Oral Daily  . amLODipine  10 mg Oral Daily  . atorvastatin  20 mg Oral Daily  . azithromycin  250 mg Oral Daily    . cefTRIAXone (ROCEPHIN)  IV  1 g Intravenous Q24H  . cholecalciferol  1,000 Units Oral Daily  . [START ON 05/03/2016] cloNIDine  0.1 mg Transdermal Weekly  . docusate sodium  100 mg Oral BID  . donepezil  5 mg Oral QHS  . enoxaparin  50 mg Subcutaneous QHS  . feeding supplement (ENSURE ENLIVE)  237 mL Oral TID BM  . ferrous sulfate  325 mg Oral TID PC  . fluticasone  2 spray Each Nare Daily  . fluticasone furoate-vilanterol  1 puff Inhalation Daily  . insulin aspart  0-9 Units Subcutaneous TID WC  . insulin detemir  8 Units Subcutaneous QHS  . ipratropium-albuterol  3 mL Nebulization BID  . lidocaine  1 patch Transdermal Q24H  . lisinopril  10 mg Oral Daily  . methylPREDNISolone (SOLU-MEDROL) injection  125 mg Intravenous Q6H  . metoprolol  50 mg Oral BID  . sodium chloride flush  3 mL Intravenous Q12H  . sodium chloride flush  3 mL Intravenous Q12H   Continuous Infusions: . sodium chloride 75 mL/hr at 04/30/16 1058    LOS: 1 day    Time spent: 20 minutes   Debbora PrestoMAGICK-Obert Espindola, MD [facility] Pager [phone]  If 7PM-7AM, please contact night-coverage [email or web address] Password [email or web address] 04/30/2016, 4:26 PM

## 2016-04-30 NOTE — Progress Notes (Addendum)
Initial Nutrition Assessment  DOCUMENTATION CODES:   Severe malnutrition in context of chronic illness, Underweight  INTERVENTION:    Ensure Enlive po TID, each supplement provides 350 kcal and 20 grams of protein  NUTRITION DIAGNOSIS:   Malnutrition related to chronic illness as evidenced by severe depletion of muscle mass, severe depletion of body fat  GOAL:   Patient will meet greater than or equal to 90% of their needs  MONITOR:   PO intake, Supplement acceptance, Labs, Weight trends, I & O's  REASON FOR ASSESSMENT:   Consult COPD Protocol  ASSESSMENT:   83 y.o. Male with medical history significant of COPD, diabetes mellitus, hypertension, hyperlipidemia and dementia. He was recently discharged from [facility] on 04/08/2006 for left traumatic hip injury. He was also recently started on Lovenox 2 days ago for left lower extremity DVT.  Pt reports his appetite is "ok". No % PO intake records available. States he had an Ensure Enlive supplement "the other day". Amenable to RD ordering during hospitalization. CBG's 204-179.  Nutrition-Focused physical exam completed. Findings are severe fat depletion, severe muscle depletion, and no edema.   Diet Order:  Diet Carb Modified Fluid consistency: Thin; Room service appropriate? Yes  Skin:  Reviewed, no issues  Last BM:  2/10  Height:   Ht Readings from Last 1 Encounters:  04/29/16 5\' 8"  (1.727 m)    Weight:   Wt Readings from Last 1 Encounters:  04/30/16 116 lb (52.6 kg)   Ideal Body Weight:  70 kg  BMI:  Body mass index is 17.64 kg/m.  Estimated Nutritional Needs:   Kcal:  1500-1700  Protein:  70-80 gm  Fluid:  1.5-1.7 L  EDUCATION NEEDS:   No education needs identified at this time  Maureen ChattersKatie Janyth Riera, RD, LDN Pager #: [phone] After-Hours Pager #: [phone]

## 2016-04-30 NOTE — Progress Notes (Signed)
Patient transferred to 3 east form the Ed. Patient oriented to room. Patient is a poor historian and is unable to answer admission questions. Patient stable. Call bell within reach. Will continue to monitor patient.

## 2016-04-30 NOTE — Evaluation (Signed)
Physical Therapy Evaluation Patient Details Name: Patrick Morrison MRN: [id number] DOB: 10/18/1933 Today's Date: 04/30/2016   History of Present Illness  Pt is an 83 y/o male admitted secondary to SOB with hypoxia due to a COPD exacerbation. PMH including but not limited to COPD, DM, HTN and dementia.  Clinical Impression  Pt presented supine in bed with HOB elevated, awake and willing to participate in therapy session. Prior to admission, pt reported that he occasionally used a RW to ambulate; however, pt proved to be an unreliable source of information with cognitive deficits at baseline (dementia) and no family members present to confirm information provided. Pt currently requires mod A for bed mobility and mod-max A x2 for transfers. Pt would continue to benefit from skilled physical therapy services at this time while admitted and after d/c to address his below listed limitations in order to improve his overall safety and independence with functional mobility.      Follow Up Recommendations SNF    Equipment Recommendations  None recommended by PT    Recommendations for Other Services       Precautions / Restrictions Precautions Precautions: Fall Restrictions Weight Bearing Restrictions: No      Mobility  Bed Mobility Overal bed mobility: Needs Assistance Bed Mobility: Supine to Sit     Supine to sit: Mod assist;+2 for safety/equipment     General bed mobility comments: mod A to elevate trunk and use of bed pads to position hips at EOB  Transfers Overall transfer level: Needs assistance Equipment used: 2 person hand held assist Transfers: Sit to/from [employer] Sit to Stand: Mod assist;+2 physical assistance Stand pivot transfers: Max assist;+2 physical assistance       General transfer comment: increased time, verbal and tactile cueing for sequencing and hand placement, mod A x2 to rise from bed and max A x2 for pivotal movements to recliner  chair  Ambulation/Gait                Stairs            Wheelchair Mobility    Modified Rankin (Stroke Patients Only)       Balance Overall balance assessment: Needs assistance Sitting-balance support: Feet supported;Bilateral upper extremity supported Sitting balance-Leahy Scale: Poor     Standing balance support: During functional activity Standing balance-Leahy Scale: Poor                               Pertinent Vitals/Pain Pain Assessment: No/denies pain    Home Living Family/patient expects to be discharged to:: Skilled nursing facility                      Prior Function Level of Independence: Independent with assistive device(s)         Comments: Pt reports that he occasionally uses a RW for ambulation; Pt also appearing to be poor historian but no family present to confirm PLOF and home set-up     Hand Dominance        Extremity/Trunk Assessment   Upper Extremity Assessment Upper Extremity Assessment: Generalized weakness    Lower Extremity Assessment Lower Extremity Assessment: Generalized weakness;LLE deficits/detail LLE Deficits / Details: pt with decreased strength and ROM limitations following a recent traumatic injury  LLE: Unable to fully assess due to pain       Communication   Communication: No difficulties  Cognition Arousal/Alertness: Awake/alert Behavior During Therapy:  WFL for tasks assessed/performed Overall Cognitive Status: History of cognitive impairments - at baseline                 General Comments: pt with dementia at baseline    General Comments      Exercises     Assessment/Plan    PT Assessment Patient needs continued PT services  PT Problem List Decreased strength;Decreased range of motion;Decreased activity tolerance;Decreased balance;Decreased mobility;Decreased coordination;Decreased cognition;Decreased knowledge of use of DME;Decreased safety awareness;Pain           PT Treatment Interventions DME instruction;Gait training;Functional mobility training;Therapeutic activities;Therapeutic exercise;Balance training;Neuromuscular re-education;Patient/family education    PT Goals (Current goals can be found in the Care Plan section)  Acute Rehab PT Goals Patient Stated Goal: none stated PT Goal Formulation: Patient unable to participate in goal setting Time For Goal Achievement: 05/14/16 Potential to Achieve Goals: Fair    Frequency Min 2X/week   Barriers to discharge        Co-evaluation               End of Session Equipment Utilized During Treatment: Gait belt Activity Tolerance: Patient limited by pain Patient left: in chair;with call bell/phone within reach;with chair alarm set Nurse Communication: Mobility status         Time: [id number] PT Time Calculation (min) (ACUTE ONLY): 17 min   Charges:   PT Evaluation $PT Eval Moderate Complexity: 1 Procedure     PT G CodesAlessandra Bevels:        Kabeer Hoagland M Tyshawn Ciullo 04/30/2016, 1:41 PM Deborah ChalkJennifer Yeison Sippel, PT, DPT [phone]

## 2016-04-30 NOTE — Progress Notes (Signed)
Pt has a brace on his left leg, did not found documentation that where did he get that from, will continue to follow up on that

## 2016-05-01 LAB — CBC
HCT: 29.9 % — ABNORMAL LOW (ref 39.0–52.0)
Hemoglobin: 10.5 g/dL — ABNORMAL LOW (ref 13.0–17.0)
MCH: 31.7 pg (ref 26.0–34.0)
MCHC: 35.1 g/dL (ref 30.0–36.0)
MCV: 90.3 fL (ref 78.0–100.0)
Platelets: 279 10*3/uL (ref 150–400)
RBC: 3.31 MIL/uL — ABNORMAL LOW (ref 4.22–5.81)
RDW: 13.7 % (ref 11.5–15.5)
WBC: 13.3 10*3/uL — ABNORMAL HIGH (ref 4.0–10.5)

## 2016-05-01 LAB — BASIC METABOLIC PANEL
Anion gap: 10 (ref 5–15)
BUN: 45 mg/dL — AB (ref 6–20)
CALCIUM: 8.3 mg/dL — AB (ref 8.9–10.3)
CO2: 19 mmol/L — ABNORMAL LOW (ref 22–32)
CREATININE: 2.15 mg/dL — AB (ref 0.61–1.24)
Chloride: 107 mmol/L (ref 101–111)
GFR calc non Af Amer: 27 mL/min — ABNORMAL LOW (ref >60)
GFR, EST AFRICAN AMERICAN: 32 mL/min — AB (ref >60)
Glucose, Bld: 270 mg/dL — ABNORMAL HIGH (ref 65–99)
Potassium: 3.8 mmol/L (ref 3.5–5.1)
SODIUM: 136 mmol/L (ref 135–145)

## 2016-05-01 LAB — GLUCOSE, CAPILLARY
GLUCOSE-CAPILLARY: 231 mg/dL — AB (ref 65–99)
GLUCOSE-CAPILLARY: 458 mg/dL — AB (ref 65–99)
Glucose-Capillary: 334 mg/dL — ABNORMAL HIGH (ref 65–99)

## 2016-05-01 LAB — HEMOGLOBIN A1C
Hgb A1c MFr Bld: 7.5 % — ABNORMAL HIGH (ref 4.8–5.6)
Mean Plasma Glucose: 169 mg/dL

## 2016-05-01 MED ORDER — METOPROLOL TARTRATE 5 MG/5ML IV SOLN
5.0000 mg | Freq: Once | INTRAVENOUS | Status: AC
  Administered 2016-05-01: 5 mg via INTRAVENOUS
  Filled 2016-05-01: qty 5

## 2016-05-01 MED ORDER — IPRATROPIUM-ALBUTEROL 0.5-2.5 (3) MG/3ML IN SOLN
3.0000 mL | Freq: Three times a day (TID) | RESPIRATORY_TRACT | Status: DC
  Administered 2016-05-01 – 2016-05-02 (×2): 3 mL via RESPIRATORY_TRACT
  Filled 2016-05-01 (×2): qty 3

## 2016-05-01 NOTE — Progress Notes (Signed)
Pt's vitals stable, no any complain of pain, tolerating meds and diet, will continue to monitor the patient.  Lonia Farberekha, RN

## 2016-05-01 NOTE — Evaluation (Signed)
Occupational Therapy Evaluation Patient Details Name: Patrick Morrison MRN: [id number] DOB: June 02, 1933 Today's Date: 05/01/2016    History of Present Illness Pt is an 83 y/o male admitted secondary to SOB with hypoxia due to a COPD exacerbation. PMH including but not limited to COPD, DM, HTN and dementia.   Clinical Impression   Unsure of pts PLOF; he is unable to provide and no family present. Currently pt able to complete self feeding and grooming activities at bed level. Requires total assist for peri care as he was found incontinent upon arrival. Max assist to roll in bed. Recommending SNF for follow up to maximize independence and safety with ADL and functional mobility. Pt would benefit from continued skilled OT to address established goals.    Follow Up Recommendations  SNF;Supervision/Assistance - 24 hour    Equipment Recommendations  Other (comment) (TBD at next venue)    Recommendations for Other Services       Precautions / Restrictions Precautions Precautions: Fall Precaution Comments: L knee immoblizer donned in bed Restrictions Weight Bearing Restrictions: No      Mobility Bed Mobility Overal bed mobility: Needs Assistance Bed Mobility: Rolling Rolling: Max assist         General bed mobility comments: Cues for hand placement and use of bed rails.  Transfers                      Balance                                            ADL Overall ADL's : Needs assistance/impaired Eating/Feeding: Set up;Supervision/ safety;Bed level Eating/Feeding Details (indicate cue type and reason): Pt finishing breakfast upon arrival Grooming: Set up;Supervision/safety;Bed level;Wash/dry face;Wash/dry hands       Lower Body Bathing: Total assistance;Bed level   Upper Body Dressing : Maximal assistance;Bed level Upper Body Dressing Details (indicate cue type and reason): to doff/don gown         Toileting- Clothing Manipulation and  Hygiene: Total assistance;Bed level         General ADL Comments: Pt found incontinent of urine upon arrival. Total assist to clean at bed level. Max assist to roll L and R and scoot up in bed. Pt assisting with UEs and use of bed rail.     Vision     Perception     Praxis      Pertinent Vitals/Pain Pain Assessment: Faces Faces Pain Scale: Hurts even more Pain Location: L hip Pain Descriptors / Indicators: Grimacing;Guarding Pain Intervention(s): Monitored during session;Limited activity within patient's tolerance     Hand Dominance     Extremity/Trunk Assessment Upper Extremity Assessment Upper Extremity Assessment: Generalized weakness   Lower Extremity Assessment Lower Extremity Assessment: Defer to PT evaluation       Communication Communication Communication: No difficulties   Cognition Arousal/Alertness: Awake/alert Behavior During Therapy: WFL for tasks assessed/performed Overall Cognitive Status: No family/caregiver present to determine baseline cognitive functioning                 General Comments: Pt with baseline dementia. Reports he lives at home with his mom and dad and that he is 76 years old.   General Comments       Exercises       Shoulder Instructions      Home Living Family/patient expects to be  discharged to:: Skilled nursing facility                                        Prior Functioning/Environment          Comments: Unsure, pt is a poor historian and unable to provide information. No family present        OT Problem List: Decreased strength;Decreased activity tolerance;Impaired balance (sitting and/or standing);Decreased cognition;Decreased safety awareness;Decreased knowledge of use of DME or AE;Decreased knowledge of precautions;Pain   OT Treatment/Interventions: Self-care/ADL training;Energy conservation;DME and/or AE instruction;Therapeutic exercise;Therapeutic activities;Patient/family  education;Balance training    OT Goals(Current goals can be found in the care plan section) Acute Rehab OT Goals Patient Stated Goal: none stated OT Goal Formulation: With patient Time For Goal Achievement: 05/15/16 Potential to Achieve Goals: Good ADL Goals Pt Will Perform Grooming: with min guard assist;sitting Pt Will Perform Lower Body Bathing: with min assist;sit to/from stand Pt Will Perform Lower Body Dressing: with min assist;sit to/from stand Pt Will Transfer to Toilet: with min assist;stand pivot transfer;bedside commode  OT Frequency: Min 2X/week   Barriers to D/C:            Co-evaluation              End of Session    Activity Tolerance: Patient limited by pain;Patient tolerated treatment well Patient left: in bed;with call bell/phone within reach;with bed alarm set   Time: 1132-1150 OT Time Calculation (min): 18 min Charges:  OT General Charges $OT Visit: 1 Procedure OT Evaluation $OT Eval Moderate Complexity: 1 Procedure G-Codes:     Gaye AlkenBailey A Chaniyah Jahr M.S., OTR/L Pager: [phone]  05/01/2016, 1:00 PM

## 2016-05-01 NOTE — Progress Notes (Signed)
Patient ID: Patrick Morrison, male   DOB: [birth year], 83 y.o.   MRN: [id number]    PROGRESS NOTE  Patrick Morrison  WUJ:[id number] DOB: 25-Mar-1933 DOA: 04/29/2016  PCP: Lynnea Ferrier, MD   Brief Narrative:  83 y.o. male with medical history significant of COPD, diabetes mellitus, hypertension, hyperlipidemia and dementia, recently discharged from [facility] on 04/08/2006 for left traumatic hip injury. He was also recently started on Lovenox 2 days prior to this admission, for left lower extremity DVT. Pt presented [facility] with shortness of breath and cough without fever or chills. 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  - keep on Insulin Levemir - also on SSI for now     CKD stage III - Cr in January was ~ 2.6 - currently at baseline - BMP In AM    Recent DVT - continue with Lovenox     HTN, essential - continue home medical regimen     Protein-calorie malnutrition, severe - appreciate nutritionist consultation    DVT prophylaxis: Lovenox SQ Code Status: DNR Family Communication: Patient at bedside  Disposition Plan: SNF in 1-2 days depending on solumedrol requirement, once able to transition to oral Prednisone, can plan d/c   Consultants:   None  Procedures:   None  Antimicrobials:   Zithromax 2/09 -->  Rocephin 2/09 -->  Subjective: No events overnight. Still with significant wheezing and exertional dyspnea.   Objective: Vitals:   05/01/16 0519 05/01/16  0815 05/01/16 1151 05/01/16 1200  BP: (!) 184/59   139/61  Pulse: (!) 55   (!) 55  Resp: 19   18  Temp: 97.4 F (36.3 C)   97.7 F (36.5 C)  TempSrc: Oral   Oral  SpO2: 95% 95% 95% 97%  Weight: 50.7 kg (111 lb 12.8 oz)     Height:        Intake/Output Summary (Last 24 hours) at 05/01/16 1450 Last data filed at 05/01/16 0520  Gross per 24 hour  Intake              170 ml  Output              200 ml  Net              -30 ml   Filed Weights   04/29/16 2042 04/30/16 0612 05/01/16 0519  Weight: 51.5 kg (113 lb 9.6 oz) 52.6 kg (116 lb) 50.7 kg (111 lb 12.8 oz)    Examination:  General exam: Appears calm and comfortable  Respiratory system: Course breath sounds bilaterally with diffuse bilateral wheezing.  Cardiovascular system: RRR. No JVD, rubs, gallops or clicks. No pedal edema. Gastrointestinal system: Abdomen is nondistended, soft and nontender. No organomegaly or masses felt.   Data Reviewed: I have personally reviewed following labs and imaging studies  CBC:  Recent Labs Lab 04/29/16 1034 04/29/16 1051 04/30/16 0341 05/01/16 0710  WBC 5.2  --  4.2 13.3*  NEUTROABS 3.8  --   --   --  HGB 9.5* 9.5* 10.5* 10.5*  HCT 29.3* 28.0* 32.0* 29.9*  MCV 94.2  --  93.6 90.3  PLT 268  --  292 279   Basic Metabolic Panel:  Recent Labs Lab 04/29/16 1051 04/30/16 0341 05/01/16 0710  NA 140 135 136  K 3.5 4.1 3.8  CL 103 100* 107  CO2  --  23 19*  GLUCOSE 170* 201* 270*  BUN 38* 37* 45*  CREATININE 2.10* 2.13* 2.15*  CALCIUM  --  8.2* 8.3*   Coagulation Profile:  Recent Labs Lab 04/29/16 1042 04/30/16 0341  INR 1.15 1.12   Cardiac Enzymes:  Recent Labs Lab 04/29/16 2120 04/30/16 0341 04/30/16 1025  TROPONINI <0.03 <0.03 <0.03   CBG:  Recent Labs Lab 04/30/16 0736 04/30/16 1245 04/30/16 1606 04/30/16 2309 05/01/16 1205  GLUCAP 179* 291* 271* 241* 231*   Radiology Studies: Dg Chest 2 View Result Date: 04/29/2016 COPD.  No active  disease.  Nm Pulmonary Perf And Vent Result Date: 04/29/2016 Heterogeneous ventilation and profusion pattern with out matched defects is most consistent COPD pattern. No evidence of acute pulmonary embolism.  Scheduled Meds: . acidophilus  1 capsule Oral Daily  . amLODipine  10 mg Oral Daily  . atorvastatin  20 mg Oral Daily  . azithromycin  250 mg Oral Daily  . cefTRIAXone (ROCEPHIN)  IV  1 g Intravenous Q24H  . cholecalciferol  1,000 Units Oral Daily  . [START ON 05/03/2016] cloNIDine  0.1 mg Transdermal Weekly  . docusate sodium  100 mg Oral BID  . donepezil  5 mg Oral QHS  . enoxaparin  50 mg Subcutaneous QHS  . feeding supplement (ENSURE ENLIVE)  237 mL Oral TID BM  . ferrous sulfate  325 mg Oral TID PC  . fluticasone  2 spray Each Nare Daily  . fluticasone furoate-vilanterol  1 puff Inhalation Daily  . insulin aspart  0-9 Units Subcutaneous TID WC  . insulin detemir  8 Units Subcutaneous QHS  . ipratropium-albuterol  3 mL Nebulization TID  . lidocaine  1 patch Transdermal Q24H  . lisinopril  10 mg Oral Daily  . methylPREDNISolone (SOLU-MEDROL) injection  40 mg Intravenous Q12H  . metoprolol  50 mg Oral BID  . sodium chloride flush  3 mL Intravenous Q12H  . sodium chloride flush  3 mL Intravenous Q12H   Continuous Infusions:   LOS: 2 days    Time spent: 20 minutes   Debbora PrestoMAGICK-Rainen Vanrossum, MD [facility] Pager [phone]  If 7PM-7AM, please contact night-coverage [email or web address] Password [email or web address] 05/01/2016, 2:50 PM

## 2016-05-02 DIAGNOSIS — J441 Chronic obstructive pulmonary disease with (acute) exacerbation: Secondary | ICD-10-CM

## 2016-05-02 DIAGNOSIS — E43 Unspecified severe protein-calorie malnutrition: Secondary | ICD-10-CM

## 2016-05-02 DIAGNOSIS — E1165 Type 2 diabetes mellitus with hyperglycemia: Secondary | ICD-10-CM

## 2016-05-02 LAB — CBC
HCT: 27.2 % — ABNORMAL LOW (ref 39.0–52.0)
HEMOGLOBIN: 9 g/dL — AB (ref 13.0–17.0)
MCH: 30.5 pg (ref 26.0–34.0)
MCHC: 33.1 g/dL (ref 30.0–36.0)
MCV: 92.2 fL (ref 78.0–100.0)
Platelets: 267 10*3/uL (ref 150–400)
RBC: 2.95 MIL/uL — AB (ref 4.22–5.81)
RDW: 13.6 % (ref 11.5–15.5)
WBC: 10.4 10*3/uL (ref 4.0–10.5)

## 2016-05-02 LAB — BASIC METABOLIC PANEL
Anion gap: 12 (ref 5–15)
BUN: 56 mg/dL — ABNORMAL HIGH (ref 6–20)
CO2: 21 mmol/L — ABNORMAL LOW (ref 22–32)
Calcium: 7.9 mg/dL — ABNORMAL LOW (ref 8.9–10.3)
Chloride: 98 mmol/L — ABNORMAL LOW (ref 101–111)
Creatinine, Ser: 2.4 mg/dL — ABNORMAL HIGH (ref 0.61–1.24)
GFR calc Af Amer: 28 mL/min — ABNORMAL LOW (ref >60)
GFR calc non Af Amer: 24 mL/min — ABNORMAL LOW (ref >60)
Glucose, Bld: 534 mg/dL (ref 65–99)
Potassium: 3.9 mmol/L (ref 3.5–5.1)
Sodium: 131 mmol/L — ABNORMAL LOW (ref 135–145)

## 2016-05-02 LAB — GLUCOSE, CAPILLARY
GLUCOSE-CAPILLARY: 130 mg/dL — AB (ref 65–99)
GLUCOSE-CAPILLARY: 238 mg/dL — AB (ref 65–99)
Glucose-Capillary: 244 mg/dL — ABNORMAL HIGH (ref 65–99)
Glucose-Capillary: 420 mg/dL — ABNORMAL HIGH (ref 65–99)
Glucose-Capillary: 239 mg/dL — ABNORMAL HIGH (ref 65–99)

## 2016-05-02 MED ORDER — INSULIN ASPART 100 UNIT/ML ~~LOC~~ SOLN
4.0000 [IU] | Freq: Three times a day (TID) | SUBCUTANEOUS | Status: DC
  Administered 2016-05-02: 4 [IU] via SUBCUTANEOUS

## 2016-05-02 MED ORDER — INSULIN ASPART 100 UNIT/ML ~~LOC~~ SOLN
10.0000 [IU] | Freq: Once | SUBCUTANEOUS | Status: AC
  Administered 2016-05-02: 10 [IU] via SUBCUTANEOUS

## 2016-05-02 MED ORDER — PREDNISONE 20 MG PO TABS: 40.0000 mg | ORAL_TABLET | Freq: Every day | ORAL | Status: DC

## 2016-05-02 MED ORDER — IPRATROPIUM-ALBUTEROL 0.5-2.5 (3) MG/3ML IN SOLN
3.0000 mL | Freq: Two times a day (BID) | RESPIRATORY_TRACT | Status: DC
  Administered 2016-05-02 – 2016-05-03 (×2): 3 mL via RESPIRATORY_TRACT
  Filled 2016-05-02 (×4): qty 3

## 2016-05-02 MED ORDER — INSULIN DETEMIR 100 UNIT/ML ~~LOC~~ SOLN
20.0000 [IU] | Freq: Every day | SUBCUTANEOUS | Status: DC
  Administered 2016-05-02: 20 [IU] via SUBCUTANEOUS
  Filled 2016-05-02: qty 0.2

## 2016-05-02 NOTE — NC FL2 (Signed)
[place] MEDICAID FL2 LEVEL OF CARE SCREENING TOOL     IDENTIFICATION  Patient Name: Patrick Morrison Birthdate: 09/12/1933 Sex: male Admission Date (Current Location): 04/29/2016  [facility] [place]   Facility and Address:  [facility], [address], [place], [place] [address]      Provider Number: [id number]  Attending Physician Name and Address:  Maxie Barbron Prasad Bhandari, MD  Relative Name and Phone Number:       Current Level of Care: Hospital Recommended Level of Care: Skilled Nursing Facility Prior Approval Number:    Date Approved/Denied:   PASRR Number: [id number]  Discharge Plan: SNF    Current Diagnoses: Patient Active Problem List   Diagnosis Date Noted  . Protein-calorie malnutrition, severe 04/30/2016  . Hypoxia 04/29/2016  . COPD exacerbation (HCC) 04/29/2016  . DM2 (diabetes mellitus, type 2) (HCC) 04/29/2016  . Hip hematoma, left, initial encounter 04/03/2016  . Closed left hip fracture, sequela 02/09/2016    Orientation RESPIRATION BLADDER Height & Weight     Self  Normal Incontinent Weight: 110 lb 14.4 oz (50.3 kg) Height:  5\' 8"  (172.7 cm)  BEHAVIORAL SYMPTOMS/MOOD NEUROLOGICAL BOWEL NUTRITION STATUS   (None)  (None) Incontinent Diet (Carb modified)  AMBULATORY STATUS COMMUNICATION OF NEEDS Skin   Extensive Assist Verbally Surgical wounds, Bruising, Other (Comment) (Skin tear)                       Personal Care Assistance Level of Assistance  Bathing, Feeding, Dressing Bathing Assistance: Maximum assistance Feeding assistance: Limited assistance Dressing Assistance: Maximum assistance     Functional Limitations Info  Sight, Hearing, Speech Sight Info: Adequate Hearing Info: Adequate Speech Info: Adequate    SPECIAL CARE FACTORS FREQUENCY  PT (By licensed PT), OT (By licensed OT)     PT Frequency: 5 x week OT Frequency: 5 x week            Contractures Contractures Info: Not  present    Additional Factors Info  Code Status, Allergies Code Status Info: DNR Allergies Info: NKDA           Current Medications (05/02/2016):  This is the current hospital active medication list Current Facility-Administered Medications  Medication Dose Route Frequency Provider Last Rate Last Dose  . 0.9 %  sodium chloride infusion  250 mL Intravenous PRN Jackie PlumGeorge Osei-Bonsu, MD      . acetaminophen (TYLENOL) tablet 500 mg  500 mg Oral Q6H PRN Jackie PlumGeorge Osei-Bonsu, MD      . acidophilus (RISAQUAD) capsule 1 capsule  1 capsule Oral Daily Jackie PlumGeorge Osei-Bonsu, MD   1 capsule at 05/02/16 1033  . albuterol (PROVENTIL) (2.5 MG/3ML) 0.083% nebulizer solution 2.5 mg  2.5 mg Nebulization Q2H PRN Dorothea OgleIskra M Myers, MD      . amLODipine (NORVASC) tablet 10 mg  10 mg Oral Daily Jackie PlumGeorge Osei-Bonsu, MD   10 mg at 05/02/16 1033  . atorvastatin (LIPITOR) tablet 20 mg  20 mg Oral Daily Jackie PlumGeorge Osei-Bonsu, MD   20 mg at 05/02/16 1033  . azithromycin (ZITHROMAX) tablet 250 mg  250 mg Oral Daily Jackie PlumGeorge Osei-Bonsu, MD   250 mg at 05/02/16 1032  . cefTRIAXone (ROCEPHIN) 1 g in dextrose 5 % 50 mL IVPB  1 g Intravenous Q24H Darl HouseholderAlison M Masters, RPH   1 g at 05/01/16 2029  . cholecalciferol (VITAMIN D) tablet 1,000 Units  1,000 Units Oral Daily Jackie PlumGeorge Osei-Bonsu, MD   1,000 Units  at 05/02/16 1029  . [START ON 05/03/2016] cloNIDine (CATAPRES - Dosed in mg/24 hr) patch 0.1 mg  0.1 mg Transdermal Weekly Jackie Plum, MD      . docusate sodium (COLACE) capsule 100 mg  100 mg Oral BID Jackie Plum, MD   100 mg at 05/02/16 1032  . donepezil (ARICEPT) tablet 5 mg  5 mg Oral QHS Jackie Plum, MD   5 mg at 05/01/16 2213  . enoxaparin (LOVENOX) injection 50 mg  50 mg Subcutaneous QHS Jackie Plum, MD   50 mg at 05/01/16 2212  . feeding supplement (ENSURE ENLIVE) (ENSURE ENLIVE) liquid 237 mL  237 mL Oral TID BM Dorothea Ogle, MD   237 mL at 05/02/16 1030  . ferrous sulfate tablet 325 mg  325 mg Oral TID PC Jackie Plum, MD   325 mg at 05/02/16 0851  . fluticasone (FLONASE) 50 MCG/ACT nasal spray 2 spray  2 spray Each Nare Daily Jackie Plum, MD   2 spray at 05/02/16 1039  . fluticasone furoate-vilanterol (BREO ELLIPTA) 200-25 MCG/INH 1 puff  1 puff Inhalation Daily Jackie Plum, MD   1 puff at 05/02/16 0744  . insulin aspart (novoLOG) injection 0-9 Units  0-9 Units Subcutaneous TID WC Jackie Plum, MD   1 Units at 05/02/16 [phone]  . insulin aspart (novoLOG) injection 4 Units  4 Units Subcutaneous TID WC Dron Jaynie Collins, MD      . insulin detemir (LEVEMIR) injection 20 Units  20 Units Subcutaneous QHS Dron Jaynie Collins, MD      . ipratropium-albuterol (DUONEB) 0.5-2.5 (3) MG/3ML nebulizer solution 3 mL  3 mL Nebulization BID Dron Jaynie Collins, MD      . lidocaine (LIDODERM) 5 % 1 patch  1 patch Transdermal Q24H Jackie Plum, MD   1 patch at 05/01/16 2211  . lisinopril (PRINIVIL,ZESTRIL) tablet 10 mg  10 mg Oral Daily Jackie Plum, MD   10 mg at 05/02/16 1033  . metoprolol (LOPRESSOR) tablet 50 mg  50 mg Oral BID Jackie Plum, MD   50 mg at 05/02/16 1033  . [START ON 05/03/2016] predniSONE (DELTASONE) tablet 40 mg  40 mg Oral Q breakfast Dron Jaynie Collins, MD      . sodium chloride flush (NS) 0.9 % injection 3 mL  3 mL Intravenous Q12H Jackie Plum, MD   3 mL at 05/02/16 1039  . sodium chloride flush (NS) 0.9 % injection 3 mL  3 mL Intravenous Q12H Jackie Plum, MD   3 mL at 05/02/16 1040  . sodium chloride flush (NS) 0.9 % injection 3 mL  3 mL Intravenous PRN Jackie Plum, MD      . technetium TC 68M diethylenetriame-pentaacetic acid (DTPA) injection 32 millicurie  32 millicurie Intravenous Once PRN Audry Riles, MD      . traMADol Janean Sark) tablet 50 mg  50 mg Oral Q6H PRN Jackie Plum, MD   50 mg at 05/02/16 1034     Discharge Medications: Please see discharge summary for a list of discharge medications.  Relevant Imaging  Results:  Relevant Lab Results:   Additional Information SS#: [id number]  Margarito Liner, LCSW

## 2016-05-02 NOTE — Progress Notes (Signed)
Shift summary:  Patient non compliant with care, meals and medicine.  Spouse visited patient today.  Spouse stated that patient was the same at home and at the skilled nursing facility.  Staff encouraged patient to eat and to take his medicine.  Will continue to monitor.  Elnita MaxwellSalome N Destina Mantei, RN

## 2016-05-02 NOTE — Progress Notes (Signed)
PROGRESS NOTE    Thereasa SoloWendell E Morrison  KVQ:[id number] DOB: Feb 04, 1934 DOA: 04/29/2016 PCP: Lynnea FerrierBERT J KLEIN III, MD   Brief Narrative: 83 y.o.malewith medical history significant of COPD, diabetes mellitus, hypertension, hyperlipidemia and dementia, recently discharged from [facility] on 04/08/2006 for left traumatic hip injury. He was also recently started on Lovenox 2 days prior to this admission, for left lower extremity DVT. Pt presented IE:PPIRto:[place] with shortness of breath and cough without fever or chills. Patient admitted for COPD with acute bronchitis.  Assessment & Plan:  #  Acute COPD exacerbation with acute hypoxic respiratory failure on admission: -Chest x-ray consistent with COPD. VQ scan pulmonary with no PE -Patient has very minimal wheezing. Switch to oral regimen because of uncontrolled hyperglycemia. Continue azithromycin and ceftriaxone, day 4. Today he is on room air. -Continue bronchodilators.  #Type 2 diabetes with uncontrolled hyperglycemia: Blood sugar level around 500s likely contributed by the steroid. Patient required higher dose of as needed insulin. I increased the dose on levemir to 20 units and added premeal lispro to 4 units before meals. May need to adjust the dose of insulin depending on blood sugar level and probably with tapering prednisone.  #Recent left lower extremities DVT: Continue Lovenox. Patient has recent left leg fracture.  #Hypertension: Continue current medications. Patient is currently on lisinopril, metoprololl Norvasc. Suboptimal control of blood pressure likely due to steroid use.  #Chronic kidney disease is stage III: Serum creatinine level around baseline. Mild elevation in creatinine level today likely contributed by uncontrolled hyperglycemia. Monitor BMP. Avoid nephrotoxins.  #Severe protein calorie malnutrition: Dietary referral. Encourage oral intake.   Principal Problem:   Hypoxia Active Problems:   COPD (chronic  obstructive pulmonary disease) (HCC)   DM2 (diabetes mellitus, type 2) (HCC)   Protein-calorie malnutrition, severe  DVT prophylaxis: systemic anticoagulation with Lovenox subcutaneous  Code Status: DO NOT RESUSCITATE  Family Communication: no family present at bedside  Disposition Plan:Likely discharge to SNF in 1-2 days. Social worker consulted for nursing home discharge planning.    Consultants:   None   Procedures: none  Antimicrobials:Azithromycin and ceftriaxone since 2/9  Subjective: Patient was seen and examined at bedside. Reported weakness and fatigue. No chest pain, shortness of breath, nausea or vomiting.   Objective: Vitals:   05/02/16 0706 05/02/16 0744 05/02/16 1029 05/02/16 1033  BP: (!) 158/60  (!) 165/68   Pulse: 62   72  Resp: 18     Temp: 98 F (36.7 C)     TempSrc: Oral     SpO2: 97% 93% 93%   Weight: 50.3 kg (110 lb 14.4 oz)     Height:        Intake/Output Summary (Last 24 hours) at 05/02/16 1116 Last data filed at 05/02/16 0941  Gross per 24 hour  Intake              323 ml  Output                0 ml  Net              323 ml   Filed Weights   04/30/16 0612 05/01/16 0519 05/02/16 0706  Weight: 52.6 kg (116 lb) 50.7 kg (111 lb 12.8 oz) 50.3 kg (110 lb 14.4 oz)    Examination:  General exam: Appears calm and comfortable  Respiratory system: Clear to auscultation. Respiratory effort normal.  Cardiovascular system: S1 & S2 heard, RRR.  No pedal edema. Gastrointestinal system: Abdomen is  nondistended, soft and nontender. Normal bowel sounds heard. Skin: No rashes, lesions or ulcers  extremities: Left lower extremity has cast. No edema.  Data Reviewed: I have personally reviewed following labs and imaging studies  CBC:  Recent Labs Lab 04/29/16 1034 04/29/16 1051 04/30/16 0341 05/01/16 0710 05/02/16 0024  WBC 5.2  --  4.2 13.3* 10.4  NEUTROABS 3.8  --   --   --   --   HGB 9.5* 9.5* 10.5* 10.5* 9.0*  HCT 29.3* 28.0* 32.0* 29.9*  27.2*  MCV 94.2  --  93.6 90.3 92.2  PLT 268  --  292 279 267   Basic Metabolic Panel:  Recent Labs Lab 04/29/16 1051 04/30/16 0341 05/01/16 0710 05/02/16 0019  NA 140 135 136 131*  K 3.5 4.1 3.8 3.9  CL 103 100* 107 98*  CO2  --  23 19* 21*  GLUCOSE 170* 201* 270* 534*  BUN 38* 37* 45* 56*  CREATININE 2.10* 2.13* 2.15* 2.40*  CALCIUM  --  8.2* 8.3* 7.9*   GFR: Estimated Creatinine Clearance: 17.5 mL/min (by C-G formula based on SCr of 2.4 mg/dL (H)). Liver Function Tests: No results for input(s): AST, ALT, ALKPHOS, BILITOT, PROT, ALBUMIN in the last 168 hours. No results for input(s): LIPASE, AMYLASE in the last 168 hours. No results for input(s): AMMONIA in the last 168 hours. Coagulation Profile:  Recent Labs Lab 04/29/16 1042 04/30/16 0341  INR 1.15 1.12   Cardiac Enzymes:  Recent Labs Lab 04/29/16 2120 04/30/16 0341 04/30/16 1025  TROPONINI <0.03 <0.03 <0.03   BNP (last 3 results) No results for input(s): PROBNP in the last 8760 hours. HbA1C:  Recent Labs  04/29/16 2120  HGBA1C 7.5*   CBG:  Recent Labs Lab 05/01/16 1205 05/01/16 1631 05/01/16 2319 05/02/16 0440 05/02/16 0745  GLUCAP 231* 334* 458* 420* 130*   Lipid Profile: No results for input(s): CHOL, HDL, LDLCALC, TRIG, CHOLHDL, LDLDIRECT in the last 72 hours. Thyroid Function Tests: No results for input(s): TSH, T4TOTAL, FREET4, T3FREE, THYROIDAB in the last 72 hours. Anemia Panel: No results for input(s): VITAMINB12, FOLATE, FERRITIN, TIBC, IRON, RETICCTPCT in the last 72 hours. Sepsis Labs: No results for input(s): PROCALCITON, LATICACIDVEN in the last 168 hours.  Recent Results (from the past 240 hour(s))  Culture, Urine     Status: Abnormal (Preliminary result)   Collection Time: 04/29/16  8:25 PM  Result Value Ref Range Status   Specimen Description URINE, RANDOM  Final   Special Requests NONE  Final   Culture 50,000 COLONIES/mL PSEUDOMONAS AERUGINOSA (A)  Final   Report  Status PENDING  Incomplete         Radiology Studies: No results found.      Scheduled Meds: . acidophilus  1 capsule Oral Daily  . amLODipine  10 mg Oral Daily  . atorvastatin  20 mg Oral Daily  . azithromycin  250 mg Oral Daily  . cefTRIAXone (ROCEPHIN)  IV  1 g Intravenous Q24H  . cholecalciferol  1,000 Units Oral Daily  . [START ON 05/03/2016] cloNIDine  0.1 mg Transdermal Weekly  . docusate sodium  100 mg Oral BID  . donepezil  5 mg Oral QHS  . enoxaparin  50 mg Subcutaneous QHS  . feeding supplement (ENSURE ENLIVE)  237 mL Oral TID BM  . ferrous sulfate  325 mg Oral TID PC  . fluticasone  2 spray Each Nare Daily  . fluticasone furoate-vilanterol  1 puff Inhalation Daily  . insulin aspart  0-9 Units Subcutaneous TID WC  . insulin detemir  20 Units Subcutaneous QHS  . ipratropium-albuterol  3 mL Nebulization BID  . lidocaine  1 patch Transdermal Q24H  . lisinopril  10 mg Oral Daily  . metoprolol  50 mg Oral BID  . [START ON 05/03/2016] predniSONE  40 mg Oral Q breakfast  . sodium chloride flush  3 mL Intravenous Q12H  . sodium chloride flush  3 mL Intravenous Q12H   Continuous Infusions:   LOS: 3 days    Keitra Carusone Jaynie Collins, MD [facility] Pager [phone]  If 7PM-7AM, please contact night-coverage [email or web address] Password [facility] 05/02/2016, 11:16 AM

## 2016-05-02 NOTE — Clinical Social Work Note (Signed)
Clinical Social Work Assessment  Patient Details  Name: Patrick Morrison MRN: [id number] Date of Birth: 07/13/1933  Date of referral:  05/02/16               Reason for consult:  Facility Placement, Discharge Planning                Permission sought to share information with:  Facility Medical sales representativeContact Representative, Family Supports Permission granted to share information::  Yes, Verbal Permission Granted  Name::     Kerry DoryMary Sobel  Agency::  SNF's  Relationship::  Wife  Contact Information:  [phone]  Housing/Transportation Living arrangements for the past 2 months:  Skilled Nursing Facility, Assisted Living Facility Source of Information:  Medical Team, Spouse Patient Interpreter Needed:  None Criminal Activity/Legal Involvement Pertinent to Current Situation/Hospitalization:  No - Comment as needed Significant Relationships:  Adult Children, Spouse Lives with:  Facility Resident Do you feel safe going back to the place where you live?  Yes Need for family participation in patient care:  Yes (Comment)  Care giving concerns:  Patient was admitted from [facility]. PT recommending SNF once medically stable for discharge.   Social Worker assessment / plan:  Patient oriented to self only. CSW called patient's wife as she was not in the room. CSW introduced role and explained that PT recommendations would be discussed. Patient's wife confirmed that patient was previously at [facility] for [facility] and would like for him to return. Discussed possibility of no bed being available once stable for discharge. No bed hold. Discussed other options in the [place] area as a back up plan. Patient's wife is agreeable. Patient's wife was also happy with [facility]. Patient's wife would like RN to call her with an update. CSW will notify RN. No further concerns. CSW encouraged patient's wife to contact CSW as needed. CSW will continue to follow patient and his wife for support and  facilitate discharge to SNF once medically stable.  Employment status:  Retired Database administratornsurance information:  Managed Medicare PT Recommendations:  Skilled Nursing Facility Information / Referral to community resources:  Skilled Nursing Facility  Patient/Family's Response to care:  Patient oriented to self only. Patient's wife agreeable to return to SNF. Patient's family supportive and involved in patient's care. Patient's wife appreciated social work intervention.  Patient/Family's Understanding of and Emotional Response to Diagnosis, Current Treatment, and Prognosis:  Patient oriented to self only. Patient's wife does not have a good understanding of the reason for admission and would like RN to call her with an update. Patient's wife appears relatively happy with hospital care.  Emotional Assessment Appearance:  Appears stated age Attitude/Demeanor/Rapport:  Unable to Assess Affect (typically observed):  Unable to Assess Orientation:  Oriented to Self Alcohol / Substance use:  Never Used Psych involvement (Current and /or in the community):  No (Comment)  Discharge Needs  Concerns to be addressed:  Care Coordination Readmission within the last 30 days:  Yes Current discharge risk:  Cognitively Impaired, Dependent with Mobility Barriers to Discharge:  Continued Medical Work up, Insurance Authorization   Margarito LinerSarah C Shirlean Berman, LCSW 05/02/2016, 12:58 PM

## 2016-05-02 NOTE — Progress Notes (Signed)
Patient CBG 420 after 10 units of insulin. Paged Ap. Order to give additional 10 units of insulin and give solumedrol as ordered.

## 2016-05-02 NOTE — Progress Notes (Addendum)
Patient CBG 458. On call AP notified. Will continue to monitor patient. Order received to give 10 units of novolog.

## 2016-05-02 NOTE — Progress Notes (Signed)
Physical Therapy Treatment Patient Details Name: Patrick Morrison MRN: [id number] DOB: Mar 08, 1934 Today's Date: 05/02/2016    History of Present Illness Pt is an 83 y/o male admitted secondary to SOB with hypoxia due to a COPD exacerbation. PMH including but not limited to COPD, DM, HTN and dementia.    PT Comments    Pt with some hallucinations today during session.  Pt more resistant with attempts to mobilize with L LE pain, although did have pain meds prior to session. Con't to recommend SNF.  Follow Up Recommendations  SNF     Equipment Recommendations  None recommended by PT    Recommendations for Other Services       Precautions / Restrictions Precautions Precautions: Fall Precaution Comments: L knee immoblizer donned in bed Restrictions Weight Bearing Restrictions: No    Mobility  Bed Mobility Overal bed mobility: Needs Assistance Bed Mobility: Rolling Rolling: Max assist         General bed mobility comments: Pt resistant to all mobility in the bed. Hand over hand for use of rail then pt would not let go of rail. MAX A to re-position legs and upper body.  Transfers                 General transfer comment: Pt too resistant to attempt EOB today.  Ambulation/Gait                 Stairs            Wheelchair Mobility    Modified Rankin (Stroke Patients Only)       Balance                                    Cognition Arousal/Alertness: Awake/alert Behavior During Therapy: Flat affect Overall Cognitive Status: No family/caregiver present to determine baseline cognitive functioning                 General Comments: Pt with baseline dementia.  Seeing things in room during session today.    Exercises General Exercises - Lower Extremity Ankle Circles/Pumps: AAROM;Both;10 reps;Supine Heel Slides: AAROM;Right;10 reps;Supine Hip ABduction/ADduction: PROM;Left;5 reps;Supine    General Comments         Pertinent Vitals/Pain Pain Assessment: Faces Faces Pain Scale: Hurts whole lot Pain Location: L hip Pain Descriptors / Indicators: Grimacing;Guarding Pain Intervention(s): Limited activity within patient's tolerance;Premedicated before session;Repositioned;Monitored during session    Home Living                      Prior Function            PT Goals (current goals can now be found in the care plan section) Acute Rehab PT Goals Patient Stated Goal: none stated PT Goal Formulation: Patient unable to participate in goal setting Time For Goal Achievement: 05/14/16 Potential to Achieve Goals: Fair Progress towards PT goals: Not progressing toward goals - comment (Pt more resistant during today's session)    Frequency    Min 2X/week      PT Plan Current plan remains appropriate    Co-evaluation             End of Session Equipment Utilized During Treatment: Left knee immobilizer Activity Tolerance: Patient limited by pain Patient left: in bed;with call bell/phone within reach;with bed alarm set     Time: 4098-1191 PT Time Calculation (min) (ACUTE ONLY): 16 min  Charges:  $  Therapeutic Activity: 8-22 mins                    G Codes:      Paisely Brick LUBECK 05/02/2016, 12:32 PM

## 2016-05-03 DIAGNOSIS — R0902 Hypoxemia: Secondary | ICD-10-CM

## 2016-05-03 LAB — GLUCOSE, CAPILLARY
GLUCOSE-CAPILLARY: 31 mg/dL — AB (ref 65–99)
GLUCOSE-CAPILLARY: 39 mg/dL — AB (ref 65–99)
GLUCOSE-CAPILLARY: 47 mg/dL — AB (ref 65–99)
GLUCOSE-CAPILLARY: 227 mg/dL — AB (ref 65–99)
GLUCOSE-CAPILLARY: 137 mg/dL — AB (ref 65–99)
Glucose-Capillary: 102 mg/dL — ABNORMAL HIGH (ref 65–99)
Glucose-Capillary: 107 mg/dL — ABNORMAL HIGH (ref 65–99)
Glucose-Capillary: 62 mg/dL — ABNORMAL LOW (ref 65–99)
Glucose-Capillary: 198 mg/dL — ABNORMAL HIGH (ref 65–99)
Glucose-Capillary: 256 mg/dL — ABNORMAL HIGH (ref 65–99)

## 2016-05-03 LAB — BASIC METABOLIC PANEL
Anion gap: 11 (ref 5–15)
BUN: 58 mg/dL — AB (ref 6–20)
CHLORIDE: 103 mmol/L (ref 101–111)
CO2: 26 mmol/L (ref 22–32)
CREATININE: 2.25 mg/dL — AB (ref 0.61–1.24)
Calcium: 8.3 mg/dL — ABNORMAL LOW (ref 8.9–10.3)
GFR calc Af Amer: 30 mL/min — ABNORMAL LOW (ref >60)
GFR calc non Af Amer: 26 mL/min — ABNORMAL LOW (ref >60)
GLUCOSE: 129 mg/dL — AB (ref 65–99)
POTASSIUM: 4.2 mmol/L (ref 3.5–5.1)
SODIUM: 140 mmol/L (ref 135–145)

## 2016-05-03 LAB — URINE CULTURE: Culture: 50000 — AB

## 2016-05-03 LAB — CBC
HEMATOCRIT: 29.3 % — AB (ref 39.0–52.0)
Hemoglobin: 9.6 g/dL — ABNORMAL LOW (ref 13.0–17.0)
MCH: 30.3 pg (ref 26.0–34.0)
MCHC: 32.8 g/dL (ref 30.0–36.0)
MCV: 92.4 fL (ref 78.0–100.0)
Platelets: 274 10*3/uL (ref 150–400)
RBC: 3.17 MIL/uL — AB (ref 4.22–5.81)
RDW: 13.7 % (ref 11.5–15.5)
WBC: 9.3 10*3/uL (ref 4.0–10.5)

## 2016-05-03 MED ORDER — PREDNISONE 20 MG PO TABS: 40.0000 mg | ORAL_TABLET | Freq: Every day | ORAL | 0 refills | Status: DC

## 2016-05-03 MED ORDER — CIPROFLOXACIN HCL 250 MG PO TABS: 250.0000 mg | ORAL_TABLET | Freq: Every day | ORAL | 0 refills | Status: DC

## 2016-05-03 MED ORDER — ENOXAPARIN SODIUM 100 MG/ML ~~LOC~~ SOLN: 50.0000 mg | Freq: Every day | SUBCUTANEOUS | 0 refills | Status: DC

## 2016-05-03 MED ORDER — INSULIN ASPART 100 UNIT/ML ~~LOC~~ SOLN
0.0000 [IU] | Freq: Three times a day (TID) | SUBCUTANEOUS | Status: DC
Start: 2016-05-03 — End: 2016-05-04
  Administered 2016-05-03: 3 [IU] via SUBCUTANEOUS
  Administered 2016-05-04: 1 [IU] via SUBCUTANEOUS
  Administered 2016-05-04: 3 [IU] via SUBCUTANEOUS

## 2016-05-03 MED ORDER — DEXTROSE 50 % IV SOLN
INTRAVENOUS | Status: AC
  Administered 2016-05-03: 50 mL
  Filled 2016-05-03: qty 50

## 2016-05-03 MED ORDER — AZITHROMYCIN 250 MG PO TABS
250.0000 mg | ORAL_TABLET | Freq: Every day | ORAL | 0 refills | Status: AC
Start: 2016-05-03 — End: 2016-05-05

## 2016-05-03 MED ORDER — INSULIN DETEMIR 100 UNIT/ML ~~LOC~~ SOLN: 15.0000 [IU] | Freq: Every day | SUBCUTANEOUS | Status: DC

## 2016-05-03 MED ORDER — IPRATROPIUM-ALBUTEROL 0.5-2.5 (3) MG/3ML IN SOLN: 3.0000 mL | Freq: Two times a day (BID) | RESPIRATORY_TRACT | 0 refills | Status: AC

## 2016-05-03 MED ORDER — PREDNISONE 20 MG PO TABS
40.0000 mg | ORAL_TABLET | Freq: Every day | ORAL | Status: DC
  Administered 2016-05-04: 40 mg via ORAL
  Filled 2016-05-03 (×2): qty 2

## 2016-05-03 MED ORDER — GLUCOSE 40 % PO GEL
ORAL | Status: AC
  Administered 2016-05-03: 37.5 g
  Filled 2016-05-03: qty 1

## 2016-05-03 MED ORDER — BISACODYL 10 MG RE SUPP
10.0000 mg | Freq: Once | RECTAL | Status: AC
  Administered 2016-05-03: 10 mg via RECTAL
  Filled 2016-05-03: qty 1

## 2016-05-03 MED ORDER — INSULIN DETEMIR 100 UNIT/ML ~~LOC~~ SOLN: 14.0000 [IU] | Freq: Every day | SUBCUTANEOUS | 11 refills | Status: DC

## 2016-05-03 MED ORDER — INSULIN ASPART 100 UNIT/ML ~~LOC~~ SOLN: 4.0000 [IU] | Freq: Three times a day (TID) | SUBCUTANEOUS | Status: DC

## 2016-05-03 MED ORDER — INSULIN LISPRO 100 UNIT/ML ~~LOC~~ SOLN: 4.0000 [IU] | Freq: Three times a day (TID) | SUBCUTANEOUS | 11 refills | Status: DC

## 2016-05-03 NOTE — Progress Notes (Signed)
Notified on pt CBG of 47. Gave patient orange juice and graham crackers. Rechecked in 15 minutes, CBG was 31. Gave pt 2 more OJ and graham crackers, CBG now 39. Paged MD to advise.

## 2016-05-03 NOTE — Progress Notes (Signed)
Pt compliant with meds and care during this shift. Will continue to monitor.

## 2016-05-03 NOTE — Clinical Social Work Note (Addendum)
Patient can discharge back [facility] today. [facility]. It takes about 4 hours for a determination.  Charlynn CourtSarah Taegen Delker, CSW [phone]  11:36 am CS[facility] called [facility] and confirmed that they received the clinicals faxed over for review. CSW spoke with patient's wife earlier and notified her that patient would be discharging back to [facility] today and we are awaiting insurance authorization.  Charlynn CourtSarah Mitsuko Luera, CSW [phone]  1:12 pm [facility] requesting MD's contact information for peer-to-peer review. MD notified and pager number faxed to insurance company. Per MD, patient's discharge is being delayed due to low blood sugar. SNF notified.  Charlynn CourtSarah Khamari Yousuf, CSW [phone]  2:43 pm MD completed [facility]-to-[facility] MD. The MD agreed that patient should not discharge today due to low blood sugar and will deny insurance authorization for today. Per MD, PT will need to work with patient tomorrow to determine if patient can handle 1-2 hours of therapy at the facility. CSW will submit updated clinicals once patient is re-evaluated.  Charlynn CourtSarah Madelynn Malson, CSW [phone]  4:08 pm CSW received official phone call from [facility] explaining denial.  Charlynn CourtSarah Kaelie Henigan, CSW [phone]

## 2016-05-03 NOTE — Progress Notes (Signed)
Md returned page, orders to give 1 amp of D50. D50 given and pt CBG rechecked: 198. Will continue to monitor.

## 2016-05-03 NOTE — Discharge Summary (Addendum)
Physician Discharge Summary  Patrick Morrison ONG:[id number] DOB: 09/29/1933 DOA: 04/29/2016  PCP: Lynnea FerrierBERT J KLEIN III, MD  Admit date: 04/29/2016 Discharge date: 05/03/2016  Admitted From: SNF Disposition:  SNF  Recommendations for Outpatient Follow-up:  1. Follow up with PCP in 1-2 weeks 2. Please obtain BMP/CBC in one week 3.     Discharge Condition: Stable,.  CODE STATUS: DNR Diet recommendation: Heart Healthy   Brief/Interim Summary: 82 y.o.malewith medical history significant of COPD, diabetes mellitus, hypertension, hyperlipidemia and dementia, recently discharged [facility] on 04/08/2006 for left traumatic hip injury. He was also recently started on Lovenox 2 days prior to this admission, for left lower extremity DVT. Pt presented GM:[place] with shortness of breath and cough without fever or chills. Patient admitted for COPD with acute bronchitis.  Assessment & Plan:  Acute COPD exacerbation with acute hypoxic respiratory failure on admission: -Chest x-ray consistent with COPD. VQ scan pulmonary with no PE -Received  azithromycin and ceftriaxone for 4 days. Will provide 2 days of azithromycin and 3 days of cipro./ Cipro will also cover pseudomonas in urine.  .  -Continue bronchodilators. -Discharge on prednisone for 4 days.   Type 2 diabetes with uncontrolled hyperglycemia: Blood sugar level around 500s likely contributed by the steroid. Patient required higher dose of as needed insulin. Now that IV solumedrol was discontinue, CBG has been decreasing.  Hypoglycemia today, discharge cancelled. Hold insulin. Amp d 50   Recent left lower extremities DVT: Continue Lovenox. Patient has recent left leg fracture. Follow up with ortho for recent fracture.   #Hypertension: Continue current medications. Patient is currently on lisinopril, metoprololl Norvasc. Suboptimal control of blood pressure likely due to steroid use.  Chronic kidney disease is stage III:  Serum creatinine level around baseline. Mild elevation in creatinine level during admission likely contributed by uncontrolled hyperglycemia. Monitor BMP. Avoid nephrotoxins. I will discontinue lisinopril at discharge.   Severe protein calorie malnutrition: Dietary referral. Encourage oral intake.   Discharge Diagnoses:  Principal Problem:   Hypoxia Active Problems:   COPD exacerbation (HCC)   DM2 (diabetes mellitus, type 2) (HCC)   Protein-calorie malnutrition, severe    Discharge Instructions  Discharge Instructions    Diet - low sodium heart healthy    Complete by:  As directed    Increase activity slowly    Complete by:  As directed      Allergies as of 05/03/2016   No Known Allergies     Medication List    STOP taking these medications   lisinopril 10 MG tablet Commonly known as:  PRINIVIL,ZESTRIL   loperamide 2 MG capsule Commonly known as:  IMODIUM     TAKE these medications   acetaminophen 500 MG tablet Commonly known as:  TYLENOL Take 500 mg by mouth every 6 (six) hours as needed for mild pain or fever.   acidophilus Caps capsule Take 1 capsule by mouth daily.   amLODipine 10 MG tablet Commonly known as:  NORVASC Take 1 tablet (10 mg total) by mouth daily.   atorvastatin 20 MG tablet Commonly known as:  LIPITOR Take 20 mg by mouth daily.   azithromycin 250 MG tablet Commonly known as:  ZITHROMAX Take 1 tablet (250 mg total) by mouth daily.   budesonide-formoterol 160-4.5 MCG/ACT inhaler Commonly known as:  SYMBICORT Inhale 2 puffs into the lungs 2 (two) times daily.   ciprofloxacin 250 MG tablet Commonly known as:  CIPRO Take 1 tablet (250 mg total) by mouth daily  with breakfast.   cloNIDine 0.1 mg/24hr patch Commonly known as:  CATAPRES - Dosed in mg/24 hr Place 0.1 mg onto the skin once a week.   docusate sodium 100 MG capsule Commonly known as:  COLACE Take 1 capsule (100 mg total) by mouth 2 (two) times daily.   donepezil 5 MG  tablet Commonly known as:  ARICEPT Take 5 mg by mouth at bedtime.   enoxaparin 100 MG/ML injection Commonly known as:  LOVENOX Inject 0.5 mLs (50 mg total) into the skin at bedtime. What changed:  medication strength  how much to take  when to take this   ferrous sulfate 325 (65 FE) MG tablet Take 1 tablet (325 mg total) by mouth 3 (three) times daily after meals.   fluticasone 50 MCG/ACT nasal spray Commonly known as:  FLONASE Place 2 sprays into both nostrils daily.   ICY HOT LIDOCAINE PLUS MENTHOL 4-1 % Ptch Generic drug:  Lidocaine-Menthol Apply 2 patches topically every morning. To left hip remove after 12 hours   insulin detemir 100 UNIT/ML injection Commonly known as:  LEVEMIR Inject 0.14 mLs (14 Units total) into the skin at bedtime. What changed:  how much to take   insulin lispro 100 UNIT/ML injection Commonly known as:  HUMALOG Inject 0.04 mLs (4 Units total) into the skin 3 (three) times daily before meals. What changed:  how much to take   ipratropium-albuterol 0.5-2.5 (3) MG/3ML Soln Commonly known as:  DUONEB Take 3 mLs by nebulization 2 (two) times daily.   Melatonin 3 MG Tabs Take 1 tablet by mouth at bedtime.   metoprolol 50 MG tablet Commonly known as:  LOPRESSOR Take 1 tablet (50 mg total) by mouth 2 (two) times daily.   predniSONE 20 MG tablet Commonly known as:  DELTASONE Take 2 tablets (40 mg total) by mouth daily with breakfast.   PROAIR HFA 108 (90 Base) MCG/ACT inhaler Generic drug:  albuterol Inhale 2 puffs into the lungs every 6 (six) hours as needed for wheezing or shortness of breath.   traMADol 50 MG tablet Commonly known as:  ULTRAM Take 50 mg by mouth every 6 (six) hours as needed for moderate pain.   triamcinolone cream 0.1 % Commonly known as:  KENALOG Apply 1 application topically 2 (two) times daily.   Vitamin D3 2000 units capsule Take 1 capsule by mouth daily.      Follow-up Information    BERT Nani Ravens,  MD.   Specialty:  Internal Medicine Contact information: [address] [facility] [place] [address] [phone]          No Known Allergies  Consultations:  none   Procedures/Studies: Dg Chest 2 View  Result Date: 04/29/2016 CLINICAL DATA:  Shortness of Breath EXAM: CHEST  2 VIEW COMPARISON:  02/11/2016 FINDINGS: There is hyperinflation of the lungs compatible with COPD. Heart and mediastinal contours are within normal limits. No focal opacities or effusions. No acute bony abnormality. IMPRESSION: COPD.  No active disease. Electronically Signed   By: Charlett Nose M.D.   On: 04/29/2016 11:25   Ct Hip Left Wo Contrast  Result Date: 04/03/2016 CLINICAL DATA:  Left hip prosthesis, dislocation yesterday with reduction, continued pain. EXAM: CT OF THE LEFT HIP WITHOUT CONTRAST TECHNIQUE: Multidetector CT imaging of the left hip was performed according to the standard protocol. Multiplanar CT image reconstructions were also generated. COMPARISON:  01/31/2017 FINDINGS: Bones/Joint/Cartilage No convincing fracture. Faint linear bands of calcification along the anterior cortical  margin of the proximal femur without cortical displacement, this is probably chronic. Spurring along the acetabulum without an acetabular fracture. No lucency around the stem portion of the implant. No lower sacral or coccygeal fracture is seen. Ligaments Suboptimally assessed by CT. Muscles and Tendons Prominent piriformis hematoma measuring about 13.7 by 4.2 cm on image 1/4, this extends with the muscle through the sciatic notch. The hematoma also appears to extend down along the posterior portion of the operator internus and into the quadratus femoris and gluteus maximus with associated edema in these muscles and associated considerable subcutaneous edema in the region. The sciatic nerve is not impinged upon at the sciatic notch but is displaced by hematoma below the level of the sciatic notch.  Nonspecific presacral edema along the lower sacrum. Suspected edema in the gluteus medius potentially with trochanteric bursitis. Soft tissues Extensive subcutaneous edema lateral and posterior to the left hip. IMPRESSION: 1. Considerable hematoma extending within and along the left piriformis, operator internus, quadratus femoris, and gluteus maximus muscles. The piriformis component of the hematoma extends within the muscle through the sciatic notch region, with both intrapelvic and extrapelvic components. Although there is no direct sciatic notch impingement on the sciatic nerve, the sciatic nerve is displaced by hematoma just below the level of the sciatic notch. 2. Edema in the gluteus medius with adjacent trochanteric bursitis. 3. Nonspecific presacral edema. 4. No convincing fracture. There is some calcification along the anterior periosteal margin of the left proximal femur which is probably incidental. Electronically Signed   By: Gaylyn Rong M.D.   On: 04/03/2016 12:06   Nm Pulmonary Perf And Vent  Result Date: 04/29/2016 CLINICAL DATA:  Short of breath.  DVT. EXAM: NUCLEAR MEDICINE VENTILATION - PERFUSION LUNG SCAN TECHNIQUE: Ventilation images were obtained in multiple projections using inhaled aerosol Tc-71m DTPA. Perfusion images were obtained in multiple projections after intravenous injection of Tc-71m MAA. RADIOPHARMACEUTICALS:  Thirty-one mCi Technetium-48m DTPA aerosol inhalation and 4.2 mCi Technetium-56m MAA IV COMPARISON:  Radiograph 04/29/2016 FINDINGS: Ventilation: Extremely heterogeneous and patchy ventilation pattern within LEFT RIGHT lung. Focal decreased ventilation to the RIGHT upper lobe. Perfusion: Decrease regional perfusion to the RIGHT upper lobe matches the ventilation defect. Smaller defect within the lingula is also match. No un matched wedge-shaped peripheral perfusion defects to localize pulmonary emboli. IMPRESSION: Heterogeneous ventilation and profusion pattern with  out matched defects is most consistent COPD pattern. No evidence of acute pulmonary embolism. Electronically Signed   By: Genevive Bi M.D.   On: 04/29/2016 16:52      Subjective: Feeling better, dyspnea improved.   Discharge Exam: Vitals:   05/03/16 0425 05/03/16 1119  BP: (!) 147/68 (!) 190/64  Pulse: (!) 52 78  Resp: 16   Temp: 98.3 F (36.8 C)    Vitals:   05/03/16 0425 05/03/16 1007 05/03/16 1009 05/03/16 1119  BP: (!) 147/68   (!) 190/64  Pulse: (!) 52   78  Resp: 16     Temp: 98.3 F (36.8 C)     TempSrc: Oral     SpO2: 93% 92% 94%   Weight: 53.2 kg (117 lb 4.8 oz)     Height:        General: Pt is alert, awake, not in acute distress Cardiovascular: RRR, S1/S2 +, no rubs, no gallops Respiratory: CTA bilaterally, no wheezing, no rhonchi Abdominal: Soft, NT, ND, bowel sounds + Extremities: no edema, no cyanosis    The results of significant diagnostics from this hospitalization (including imaging, microbiology,  ancillary and laboratory) are listed below for reference.     Microbiology: Recent Results (from the past 240 hour(s))  Culture, Urine     Status: Abnormal   Collection Time: 04/29/16  8:25 PM  Result Value Ref Range Status   Specimen Description URINE, RANDOM  Final   Special Requests NONE  Final   Culture (A)  Final    50,000 COLONIES/mL PSEUDOMONAS AERUGINOSA 20,000 COLONIES/mL ENTEROCOCCUS FAECALIS    Report Status 05/03/2016 FINAL  Final   Organism ID, Bacteria PSEUDOMONAS AERUGINOSA (A)  Final   Organism ID, Bacteria ENTEROCOCCUS FAECALIS (A)  Final      Susceptibility   Enterococcus faecalis - MIC*    AMPICILLIN <=2 SENSITIVE Sensitive     LEVOFLOXACIN >=8 RESISTANT Resistant     NITROFURANTOIN <=16 SENSITIVE Sensitive     VANCOMYCIN 1 SENSITIVE Sensitive     * 20,000 COLONIES/mL ENTEROCOCCUS FAECALIS   Pseudomonas aeruginosa - MIC*    CEFTAZIDIME 4 SENSITIVE Sensitive     CIPROFLOXACIN <=0.25 SENSITIVE Sensitive     GENTAMICIN  <=1 SENSITIVE Sensitive     IMIPENEM 2 SENSITIVE Sensitive     PIP/TAZO 8 SENSITIVE Sensitive     CEFEPIME 2 SENSITIVE Sensitive     * 50,000 COLONIES/mL PSEUDOMONAS AERUGINOSA     Labs: BNP (last 3 results)  Recent Labs  04/29/16 2120  BNP 315.0*   Basic Metabolic Panel:  Recent Labs Lab 04/29/16 1051 04/30/16 0341 05/01/16 0710 05/02/16 0019 05/03/16 0533  NA 140 135 136 131* 140  K 3.5 4.1 3.8 3.9 4.2  CL 103 100* 107 98* 103  CO2  --  23 19* 21* 26  GLUCOSE 170* 201* 270* 534* 129*  BUN 38* 37* 45* 56* 58*  CREATININE 2.10* 2.13* 2.15* 2.40* 2.25*  CALCIUM  --  8.2* 8.3* 7.9* 8.3*   Liver Function Tests: No results for input(s): AST, ALT, ALKPHOS, BILITOT, PROT, ALBUMIN in the last 168 hours. No results for input(s): LIPASE, AMYLASE in the last 168 hours. No results for input(s): AMMONIA in the last 168 hours. CBC:  Recent Labs Lab 04/29/16 1034 04/29/16 1051 04/30/16 0341 05/01/16 0710 05/02/16 0024 05/03/16 0533  WBC 5.2  --  4.2 13.3* 10.4 9.3  NEUTROABS 3.8  --   --   --   --   --   HGB 9.5* 9.5* 10.5* 10.5* 9.0* 9.6*  HCT 29.3* 28.0* 32.0* 29.9* 27.2* 29.3*  MCV 94.2  --  93.6 90.3 92.2 92.4  PLT 268  --  292 279 267 274   Cardiac Enzymes:  Recent Labs Lab 04/29/16 2120 04/30/16 0341 04/30/16 1025  TROPONINI <0.03 <0.03 <0.03   BNP: Invalid input(s): POCBNP CBG:  Recent Labs Lab 05/02/16 1140 05/02/16 1640 05/03/16 0743 05/03/16 0807 05/03/16 0822  GLUCAP 238* 239* 62* 102* 107*   D-Dimer No results for input(s): DDIMER in the last 72 hours. Hgb A1c No results for input(s): HGBA1C in the last 72 hours. Lipid Profile No results for input(s): CHOL, HDL, LDLCALC, TRIG, CHOLHDL, LDLDIRECT in the last 72 hours. Thyroid function studies No results for input(s): TSH, T4TOTAL, T3FREE, THYROIDAB in the last 72 hours.  Invalid input(s): FREET3 Anemia work up No results for input(s): VITAMINB12, FOLATE, FERRITIN, TIBC, IRON,  RETICCTPCT in the last 72 hours. Urinalysis    Component Value Date/Time   COLORURINE YELLOW (A) 04/03/2016 1153   APPEARANCEUR HAZY (A) 04/03/2016 1153   APPEARANCEUR Clear 07/18/2012 2128   LABSPEC 1.013 04/03/2016 1153  LABSPEC 1.013 07/18/2012 2128   PHURINE 5.0 04/03/2016 1153   GLUCOSEU 50 (A) 04/03/2016 1153   GLUCOSEU Negative 07/18/2012 2128   HGBUR NEGATIVE 04/03/2016 1153   BILIRUBINUR NEGATIVE 04/03/2016 1153   BILIRUBINUR Negative 07/18/2012 2128   KETONESUR NEGATIVE 04/03/2016 1153   PROTEINUR >=300 (A) 04/03/2016 1153   NITRITE NEGATIVE 04/03/2016 1153   LEUKOCYTESUR MODERATE (A) 04/03/2016 1153   LEUKOCYTESUR 2+ 07/18/2012 2128   Sepsis Labs Invalid input(s): PROCALCITONIN,  WBC,  LACTICIDVEN Microbiology Recent Results (from the past 240 hour(s))  Culture, Urine     Status: Abnormal   Collection Time: 04/29/16  8:25 PM  Result Value Ref Range Status   Specimen Description URINE, RANDOM  Final   Special Requests NONE  Final   Culture (A)  Final    50,000 COLONIES/mL PSEUDOMONAS AERUGINOSA 20,000 COLONIES/mL ENTEROCOCCUS FAECALIS    Report Status 05/03/2016 FINAL  Final   Organism ID, Bacteria PSEUDOMONAS AERUGINOSA (A)  Final   Organism ID, Bacteria ENTEROCOCCUS FAECALIS (A)  Final      Susceptibility   Enterococcus faecalis - MIC*    AMPICILLIN <=2 SENSITIVE Sensitive     LEVOFLOXACIN >=8 RESISTANT Resistant     NITROFURANTOIN <=16 SENSITIVE Sensitive     VANCOMYCIN 1 SENSITIVE Sensitive     * 20,000 COLONIES/mL ENTEROCOCCUS FAECALIS   Pseudomonas aeruginosa - MIC*    CEFTAZIDIME 4 SENSITIVE Sensitive     CIPROFLOXACIN <=0.25 SENSITIVE Sensitive     GENTAMICIN <=1 SENSITIVE Sensitive     IMIPENEM 2 SENSITIVE Sensitive     PIP/TAZO 8 SENSITIVE Sensitive     CEFEPIME 2 SENSITIVE Sensitive     * 50,000 COLONIES/mL PSEUDOMONAS AERUGINOSA     Time coordinating discharge: Over 30 minutes  SIGNED:   Alba Cory, MD  [facility] 05/03/2016, 11:30 AM Pager [phone]  If 7PM-7AM, please contact night-coverage [email or web address] Password [email or web address]

## 2016-05-03 NOTE — Progress Notes (Signed)
Patient refused all morning medicine.  Ate 1/2 sandwich at lunch.  Elnita MaxwellSalome N Sanae Willetts, RN

## 2016-05-03 NOTE — Care Management Important Message (Signed)
Important Message  Patient Details  Name: Patrick Morrison MRN: [id number] Date of Birth: Sep 24, 1933   Medicare Important Message Given:  Yes    Brin Ruggerio Stefan ChurchBratton 05/03/2016, 12:33 PM

## 2016-05-04 LAB — GLUCOSE, CAPILLARY
GLUCOSE-CAPILLARY: 36 mg/dL — AB (ref 65–99)
GLUCOSE-CAPILLARY: 150 mg/dL — AB (ref 65–99)
Glucose-Capillary: 228 mg/dL — ABNORMAL HIGH (ref 65–99)

## 2016-05-04 MED ORDER — HYDRALAZINE HCL 20 MG/ML IJ SOLN: 10.0000 mg | Freq: Three times a day (TID) | INTRAMUSCULAR | Status: DC | PRN

## 2016-05-04 MED ORDER — INSULIN LISPRO 100 UNIT/ML ~~LOC~~ SOLN: 2.0000 [IU] | Freq: Three times a day (TID) | SUBCUTANEOUS | 11 refills | Status: AC

## 2016-05-04 MED ORDER — INSULIN DETEMIR 100 UNIT/ML ~~LOC~~ SOLN: 5.0000 [IU] | Freq: Every day | SUBCUTANEOUS | 11 refills | Status: DC

## 2016-05-04 NOTE — Progress Notes (Signed)
Orthopedic Tech Progress Note Patient Details:  Patrick Morrison 1933-07-08 [id number]  Ortho Devices Type of Ortho Device: Knee Immobilizer Ortho Device/Splint Location: lle Ortho Device/Splint Interventions: Application   Patrick Morrison 05/04/2016, 3:53 PM

## 2016-05-04 NOTE — Progress Notes (Signed)
Patient discharged to [facility] skilled nursing with all his belongings.  Ambulance here to transport.  Elnita MaxwellSalome N Kenna Kirn, RN

## 2016-05-04 NOTE — Progress Notes (Signed)
Called report to [facility]. Transport at bedside.

## 2016-05-04 NOTE — Progress Notes (Signed)
New knee immobilizer placed on pt by ortho tech.

## 2016-05-04 NOTE — Progress Notes (Signed)
Pt knee immobilizer soiled with stool, called MD to have new order placed. Ortho paged, and will bring new one for patient.

## 2016-05-04 NOTE — Progress Notes (Signed)
Occupational Therapy Treatment Patient Details Name: Patrick Morrison MRN: [id number] DOB: 05-13-1933 Today's Date: 05/04/2016    History of present illness Pt is an 83 y/o male admitted secondary to SOB with hypoxia due to a COPD exacerbation. PMH including but not limited to COPD, DM, HTN and dementia.   OT comments  Pt cooperative and able to perform sit to stand x 1 from bed today. Pt with incontinence requiring assist to clean up and change bedding. Pt with tendency to internally rotate L LE, positioned in bed with L LE in KI and pillow between knees to maintain neutral. Continues to be appropriate for SNF as he is ambulatory at baseline.  Follow Up Recommendations  SNF;Supervision/Assistance - 24 hour    Equipment Recommendations       Recommendations for Other Services      Precautions / Restrictions Precautions Precautions: Fall Precaution Comments: L knee immoblizer donned in bed Restrictions Weight Bearing Restrictions: No       Mobility Bed Mobility Overal bed mobility: Needs Assistance Bed Mobility: Rolling;Supine to Sit;Sit to Supine Rolling: Total assist;+2 for physical assistance   Supine to sit: +2 for physical assistance;Total assist Sit to supine: +2 for physical assistance;Total assist   General bed mobility comments: Requiring assist to grasp rails with rolling, assisted for LEs and trunk  Transfers Overall transfer level: Needs assistance Equipment used: Rolling walker (2 wheeled) Transfers: Sit to/from Stand Sit to Stand: +2 physical assistance;Total assist         General transfer comment: unsafe to attempt transfer    Balance Overall balance assessment: Needs assistance Sitting-balance support: Feet supported;Bilateral upper extremity supported Sitting balance-Leahy Scale: Poor Sitting balance - Comments: appears pt attempting to unload L hip Postural control: Right lateral lean   Standing balance-Leahy Scale: Poor Standing balance  comment: stood x 1, attempted second trial without success, assist to place R foot, to rise and maintain balance                   ADL Overall ADL's : Needs assistance/impaired                 Upper Body Dressing : Maximal assistance;Bed level Upper Body Dressing Details (indicate cue type and reason): to doff/don gown         Toileting- Clothing Manipulation and Hygiene: Total assistance;Bed level Toileting - Clothing Manipulation Details (indicate cue type and reason): pt with bowel and bladder incontinence       General ADL Comments: Pt with wound on buttocks, soiled dressing removed and RN notified. No sacral dressing in supply room, unit to order more.      Vision                     Perception     Praxis      Cognition   Behavior During Therapy: Flat affect Overall Cognitive Status: No family/caregiver present to determine baseline cognitive functioning                  General Comments: pt with baseline dementia, requiring physical and verbal cues to follow commands    Extremity/Trunk Assessment               Exercises     Shoulder Instructions       General Comments      Pertinent Vitals/ Pain       Pain Assessment: Faces Faces Pain Scale: Hurts even more Pain Location: L hip Pain  Descriptors / Indicators: Grimacing;Guarding  Home Living                                          Prior Functioning/Environment              Frequency  Min 2X/week        Progress Toward Goals  OT Goals(current goals can now be found in the care plan section)  Progress towards OT goals: Progressing toward goals  Acute Rehab OT Goals Patient Stated Goal: none stated Time For Goal Achievement: 05/15/16 Potential to Achieve Goals: Fair  Plan Discharge plan remains appropriate    Co-evaluation    PT/OT/SLP Co-Evaluation/Treatment: Yes Reason for Co-Treatment: For patient/therapist safety   OT goals  addressed during session: ADL's and self-care      End of Session Equipment Utilized During Treatment: Gait belt;Rolling walker   Activity Tolerance Patient limited by pain   Patient Left in bed;with call bell/phone within reach;with nursing/sitter in room;with bed alarm set   Nurse Communication  (aware of BM and wound on buttocks)        Time: [id number] OT Time Calculation (min): 43 min  Charges: OT General Charges $OT Visit: 1 Procedure OT Treatments $Self Care/Home Management : 8-22 mins  Evern BioMayberry, Patrick Morrison 05/04/2016, 11:21 AM  [phone]

## 2016-05-04 NOTE — Progress Notes (Signed)
Physical Therapy Treatment Patient Details Name: Thereasa SoloWendell E Makarewicz MRN: [id number] DOB: 1933-12-16 Today's Date: 05/04/2016    History of Present Illness Pt is an 83 y/o male admitted secondary to SOB with hypoxia due to a COPD exacerbation. PMH including but not limited to COPD, DM, HTN and dementia.    PT Comments    Pt cooperative and attempting to assist with mobility. Expect progress will continue to be slow. Continue to recommend return to SNF for further rehab.  Follow Up Recommendations  SNF     Equipment Recommendations  None recommended by PT    Recommendations for Other Services       Precautions / Restrictions Precautions Precautions: Fall Precaution Comments: L knee immoblizer donned in bed Required Braces or Orthoses: Knee Immobilizer - Left Restrictions Weight Bearing Restrictions: No    Mobility  Bed Mobility Overal bed mobility: Needs Assistance Bed Mobility: Rolling;Supine to Sit;Sit to Supine Rolling: Total assist;+2 for physical assistance   Supine to sit: +2 for physical assistance;Total assist Sit to supine: +2 for physical assistance;Total assist   General bed mobility comments: Requiring assist to grasp rails with rolling, assisted for LEs and trunk  Transfers Overall transfer level: Needs assistance Equipment used: Rolling walker (2 wheeled) Transfers: Sit to/from Stand Sit to Stand: +2 physical assistance;Total assist         General transfer comment: Assist to bring hips and trunk up. Pt unable to get LLE fully underneath him with immobilizer on. Pt with flexed posture.   Ambulation/Gait                 Stairs            Wheelchair Mobility    Modified Rankin (Stroke Patients Only)       Balance Overall balance assessment: Needs assistance Sitting-balance support: Feet supported;Bilateral upper extremity supported Sitting balance-Leahy Scale: Poor Sitting balance - Comments: appears pt attempting to unload L  hip Postural control: Right lateral lean Standing balance support: Bilateral upper extremity supported Standing balance-Leahy Scale: Zero Standing balance comment: Stood x 1 for ~ 1 minute with max A to maintain. Unable to stand on second attempt                    Cognition Arousal/Alertness: Awake/alert Behavior During Therapy: Flat affect Overall Cognitive Status: No family/caregiver present to determine baseline cognitive functioning                 General Comments: pt with baseline dementia, requiring physical and verbal cues to follow commands    Exercises      General Comments        Pertinent Vitals/Pain Pain Assessment: Faces Faces Pain Scale: Hurts even more Pain Location: L hip Pain Descriptors / Indicators: Grimacing;Guarding Pain Intervention(s): Limited activity within patient's tolerance    Home Living                      Prior Function            PT Goals (current goals can now be found in the care plan section) Acute Rehab PT Goals Patient Stated Goal: none stated Progress towards PT goals: Progressing toward goals    Frequency    Min 2X/week      PT Plan Current plan remains appropriate    Co-evaluation PT/OT/SLP Co-Evaluation/Treatment: Yes Reason for Co-Treatment: For patient/therapist safety PT goals addressed during session: Mobility/safety with mobility OT goals addressed during session: ADL's and  self-care     End of Session           Time: 9147-8295 PT Time Calculation (min) (ACUTE ONLY): 40 min  Charges:  $Therapeutic Activity: 8-22 mins                    G Codes:      Angelina Ok Maycok 05/22/16, 1:18 PM  [employer] PT [phone]

## 2016-05-04 NOTE — Clinical Social Work Note (Addendum)
Acute rehab will send out a page for patient to be seen by PT/OT today.  Charlynn CourtSarah Gerlad Pelzel, CSW [phone]  1:21 pm [facility]. Awaiting updated PT note so that can be faxed as well.  Charlynn CourtSarah Samaad Hashem, CSW [phone]  1:25 pm Updated PT note available and [facility].  Charlynn CourtSarah Joliet Mallozzi, CSW [phone]  3:21 pm [facility] that they had received the clinicals faxed to them. The case has been assigned to a case manager and they are working on the authorization.  Charlynn CourtSarah Armoni Kludt, CSW [phone]  3:55 pm Insurance authorization obtained: [id number], rug level RVC. SNF notified. Will notify MD.  Charlynn CourtSarah Chelsie Burel, CSW [phone]

## 2016-05-04 NOTE — Clinical Social Work Note (Signed)
CSW facilitated patient discharge including contacting patient family and facility to confirm patient discharge plans. Clinical information faxed to facility and family agreeable with plan. CSW arranged ambulance transport via [facility] to [facility]. RN to call report prior to discharge [phone] Room 1203).  CSW will sign off for now as social work intervention is no longer needed. Please consult us again if new needs arise.  Charlynn CourtSarah Alem Fahl, CSW [phone]

## 2016-05-04 NOTE — Discharge Summary (Signed)
Physician Discharge Summary  Patrick Morrison AVW:[id number] DOB: 1934/03/06 DOA: 04/29/2016  PCP: Lynnea Ferrier, MD  Admit date: 04/29/2016 Discharge date: 05/04/2016  Admitted From: SNF Disposition:  SNF  Recommendations for Outpatient Follow-up:  1. Follow up with PCP in 1-2 weeks 2. Please obtain BMP/CBC in one week 3. Adjust insulin as needed.  4. Needs to work with PT> 5. Follow up with ortho     Discharge Condition: Stable,.  CODE STATUS: DNR Diet recommendation: Heart Healthy   Brief/Interim Summary: 82 y.o.malewith medical history significant of COPD, diabetes mellitus, hypertension, hyperlipidemia and dementia, recently discharged from [facility] on 04/08/2006 for left traumatic hip injury. He was also recently started on Lovenox 2 days prior to this admission, for left lower extremity DVT. Pt presented [facility] ED with shortness of breath and cough without fever or chills. Patient admitted for COPD with acute bronchitis.  Assessment & Plan:  Acute COPD exacerbation with acute hypoxic respiratory failure on admission: -Chest x-ray consistent with COPD. VQ scan pulmonary with no PE -Received  azithromycin and ceftriaxone for 4 days. Will provide 2 days of azithromycin and 3 days of cipro./ Cipro will also cover pseudomonas in urine.  .  -Continue bronchodilators. -Discharge on prednisone for 4 days.   Type 2 diabetes with uncontrolled hyperglycemia: Blood sugar level around 500s likely contributed by the steroid. Patient required higher dose of as needed insulin. Now that IV solumedrol was discontinue, CBG has been decreasing.  Had hypoglycemic episode. Patient not eating well. Discharge on lower dose of levemir, meals coverage if he eats  Recent left lower extremities DVT: Continue Lovenox. Patient has recent left leg fracture. Follow up with ortho for recent fracture.  continue with PT   #Hypertension: Continue current medications. Patient is  currently on lisinopril, metoprololl Norvasc. Suboptimal control of blood pressure likely due to steroid use.  Chronic kidney disease is stage III: Serum creatinine level around baseline. Mild elevation in creatinine level during admission likely contributed by uncontrolled hyperglycemia. Monitor BMP. Avoid nephrotoxins. I will discontinue lisinopril at discharge.   Severe protein calorie malnutrition: Dietary referral. Encourage oral intake.   Discharge Diagnoses:  Principal Problem:   Hypoxia Active Problems:   COPD exacerbation (HCC)   DM2 (diabetes mellitus, type 2) (HCC)   Protein-calorie malnutrition, severe    Discharge Instructions  Discharge Instructions    Diet - low sodium heart healthy    Complete by:  As directed    Diet - low sodium heart healthy    Complete by:  As directed    Increase activity slowly    Complete by:  As directed    Increase activity slowly    Complete by:  As directed      Allergies as of 05/04/2016   No Known Allergies     Medication List    STOP taking these medications   lisinopril 10 MG tablet Commonly known as:  PRINIVIL,ZESTRIL   loperamide 2 MG capsule Commonly known as:  IMODIUM     TAKE these medications   acetaminophen 500 MG tablet Commonly known as:  TYLENOL Take 500 mg by mouth every 6 (six) hours as needed for mild pain or fever.   acidophilus Caps capsule Take 1 capsule by mouth daily.   amLODipine 10 MG tablet Commonly known as:  NORVASC Take 1 tablet (10 mg total) by mouth daily.   atorvastatin 20 MG tablet Commonly known as:  LIPITOR Take 20 mg by mouth daily.  azithromycin 250 MG tablet Commonly known as:  ZITHROMAX Take 1 tablet (250 mg total) by mouth daily.   budesonide-formoterol 160-4.5 MCG/ACT inhaler Commonly known as:  SYMBICORT Inhale 2 puffs into the lungs 2 (two) times daily.   ciprofloxacin 250 MG tablet Commonly known as:  CIPRO Take 1 tablet (250 mg total) by mouth daily with  breakfast.   cloNIDine 0.1 mg/24hr patch Commonly known as:  CATAPRES - Dosed in mg/24 hr Place 0.1 mg onto the skin once a week.   docusate sodium 100 MG capsule Commonly known as:  COLACE Take 1 capsule (100 mg total) by mouth 2 (two) times daily.   donepezil 5 MG tablet Commonly known as:  ARICEPT Take 5 mg by mouth at bedtime.   enoxaparin 100 MG/ML injection Commonly known as:  LOVENOX Inject 0.5 mLs (50 mg total) into the skin at bedtime. What changed:  medication strength  how much to take  when to take this   ferrous sulfate 325 (65 FE) MG tablet Take 1 tablet (325 mg total) by mouth 3 (three) times daily after meals.   fluticasone 50 MCG/ACT nasal spray Commonly known as:  FLONASE Place 2 sprays into both nostrils daily.   ICY HOT LIDOCAINE PLUS MENTHOL 4-1 % Ptch Generic drug:  Lidocaine-Menthol Apply 2 patches topically every morning. To left hip remove after 12 hours   insulin detemir 100 UNIT/ML injection Commonly known as:  LEVEMIR Inject 0.05 mLs (5 Units total) into the skin at bedtime. What changed:  how much to take   insulin lispro 100 UNIT/ML injection Commonly known as:  HUMALOG Inject 0.02 mLs (2 Units total) into the skin 3 (three) times daily before meals. What changed:  how much to take   ipratropium-albuterol 0.5-2.5 (3) MG/3ML Soln Commonly known as:  DUONEB Take 3 mLs by nebulization 2 (two) times daily.   Melatonin 3 MG Tabs Take 1 tablet by mouth at bedtime.   metoprolol 50 MG tablet Commonly known as:  LOPRESSOR Take 1 tablet (50 mg total) by mouth 2 (two) times daily.   predniSONE 20 MG tablet Commonly known as:  DELTASONE Take 2 tablets (40 mg total) by mouth daily with breakfast.   PROAIR HFA 108 (90 Base) MCG/ACT inhaler Generic drug:  albuterol Inhale 2 puffs into the lungs every 6 (six) hours as needed for wheezing or shortness of breath.   traMADol 50 MG tablet Commonly known as:  ULTRAM Take 50 mg by mouth every  6 (six) hours as needed for moderate pain.   triamcinolone cream 0.1 % Commonly known as:  KENALOG Apply 1 application topically 2 (two) times daily.   Vitamin D3 2000 units capsule Take 1 capsule by mouth daily.      Follow-up Information    BERT Nani Ravens, MD.   Specialty:  Internal Medicine Contact information: [address] [facility] [place] [address] [phone]          No Known Allergies  Consultations:  none   Procedures/Studies: Dg Chest 2 View  Result Date: 04/29/2016 CLINICAL DATA:  Shortness of Breath EXAM: CHEST  2 VIEW COMPARISON:  02/11/2016 FINDINGS: There is hyperinflation of the lungs compatible with COPD. Heart and mediastinal contours are within normal limits. No focal opacities or effusions. No acute bony abnormality. IMPRESSION: COPD.  No active disease. Electronically Signed   By: Charlett Nose M.D.   On: 04/29/2016 11:25   Nm Pulmonary Perf And Vent  Result Date: 04/29/2016  CLINICAL DATA:  Short of breath.  DVT. EXAM: NUCLEAR MEDICINE VENTILATION - PERFUSION LUNG SCAN TECHNIQUE: Ventilation images were obtained in multiple projections using inhaled aerosol Tc-88m DTPA. Perfusion images were obtained in multiple projections after intravenous injection of Tc-81m MAA. RADIOPHARMACEUTICALS:  Thirty-one mCi Technetium-47m DTPA aerosol inhalation and 4.2 mCi Technetium-54m MAA IV COMPARISON:  Radiograph 04/29/2016 FINDINGS: Ventilation: Extremely heterogeneous and patchy ventilation pattern within LEFT RIGHT lung. Focal decreased ventilation to the RIGHT upper lobe. Perfusion: Decrease regional perfusion to the RIGHT upper lobe matches the ventilation defect. Smaller defect within the lingula is also match. No un matched wedge-shaped peripheral perfusion defects to localize pulmonary emboli. IMPRESSION: Heterogeneous ventilation and profusion pattern with out matched defects is most consistent COPD pattern. No evidence of acute pulmonary  embolism. Electronically Signed   By: Genevive Bi M.D.   On: 04/29/2016 16:52      Subjective: Feeling better, dyspnea improved.   Discharge Exam: Vitals:   05/04/16 1109 05/04/16 1207  BP: (!) 172/92 (!) 189/81  Pulse: 68 69  Resp:  18  Temp:  98.1 F (36.7 C)   Vitals:   05/04/16 0012 05/04/16 0534 05/04/16 1109 05/04/16 1207  BP: (!) 197/88 (!) 185/81 (!) 172/92 (!) 189/81  Pulse: 68 65 68 69  Resp:    18  Temp:  98 F (36.7 C)  98.1 F (36.7 C)  TempSrc:  Oral  Oral  SpO2:  93%  95%  Weight:  52 kg (114 lb 9.6 oz)    Height:        General: Pt is alert, awake, not in acute distress Cardiovascular: RRR, S1/S2 +, no rubs, no gallops Respiratory: CTA bilaterally, no wheezing, no rhonchi Abdominal: Soft, NT, ND, bowel sounds + Extremities: no edema, no cyanosis    The results of significant diagnostics from this hospitalization (including imaging, microbiology, ancillary and laboratory) are listed below for reference.     Microbiology: Recent Results (from the past 240 hour(s))  Culture, Urine     Status: Abnormal   Collection Time: 04/29/16  8:25 PM  Result Value Ref Range Status   Specimen Description URINE, RANDOM  Final   Special Requests NONE  Final   Culture (A)  Final    50,000 COLONIES/mL PSEUDOMONAS AERUGINOSA 20,000 COLONIES/mL ENTEROCOCCUS FAECALIS    Report Status 05/03/2016 FINAL  Final   Organism ID, Bacteria PSEUDOMONAS AERUGINOSA (A)  Final   Organism ID, Bacteria ENTEROCOCCUS FAECALIS (A)  Final      Susceptibility   Enterococcus faecalis - MIC*    AMPICILLIN <=2 SENSITIVE Sensitive     LEVOFLOXACIN >=8 RESISTANT Resistant     NITROFURANTOIN <=16 SENSITIVE Sensitive     VANCOMYCIN 1 SENSITIVE Sensitive     * 20,000 COLONIES/mL ENTEROCOCCUS FAECALIS   Pseudomonas aeruginosa - MIC*    CEFTAZIDIME 4 SENSITIVE Sensitive     CIPROFLOXACIN <=0.25 SENSITIVE Sensitive     GENTAMICIN <=1 SENSITIVE Sensitive     IMIPENEM 2 SENSITIVE  Sensitive     PIP/TAZO 8 SENSITIVE Sensitive     CEFEPIME 2 SENSITIVE Sensitive     * 50,000 COLONIES/mL PSEUDOMONAS AERUGINOSA     Labs: BNP (last 3 results)  Recent Labs  04/29/16 2120  BNP 315.0*   Basic Metabolic Panel:  Recent Labs Lab 04/29/16 1051 04/30/16 0341 05/01/16 0710 05/02/16 0019 05/03/16 0533  NA 140 135 136 131* 140  K 3.5 4.1 3.8 3.9 4.2  CL 103 100* 107 98* 103  CO2  --  23 19* 21* 26  GLUCOSE 170* 201* 270* 534* 129*  BUN 38* 37* 45* 56* 58*  CREATININE 2.10* 2.13* 2.15* 2.40* 2.25*  CALCIUM  --  8.2* 8.3* 7.9* 8.3*   Liver Function Tests: No results for input(s): AST, ALT, ALKPHOS, BILITOT, PROT, ALBUMIN in the last 168 hours. No results for input(s): LIPASE, AMYLASE in the last 168 hours. No results for input(s): AMMONIA in the last 168 hours. CBC:  Recent Labs Lab 04/29/16 1034 04/29/16 1051 04/30/16 0341 05/01/16 0710 05/02/16 0024 05/03/16 0533  WBC 5.2  --  4.2 13.3* 10.4 9.3  NEUTROABS 3.8  --   --   --   --   --   HGB 9.5* 9.5* 10.5* 10.5* 9.0* 9.6*  HCT 29.3* 28.0* 32.0* 29.9* 27.2* 29.3*  MCV 94.2  --  93.6 90.3 92.2 92.4  PLT 268  --  292 279 267 274   Cardiac Enzymes:  Recent Labs Lab 04/29/16 2120 04/30/16 0341 04/30/16 1025  TROPONINI <0.03 <0.03 <0.03   BNP: Invalid input(s): POCBNP CBG:  Recent Labs Lab 05/03/16 1446 05/03/16 1649 05/03/16 2155 05/04/16 0737 05/04/16 1133  GLUCAP 256* 227* 137* 228* 150*   D-Dimer No results for input(s): DDIMER in the last 72 hours. Hgb A1c No results for input(s): HGBA1C in the last 72 hours. Lipid Profile No results for input(s): CHOL, HDL, LDLCALC, TRIG, CHOLHDL, LDLDIRECT in the last 72 hours. Thyroid function studies No results for input(s): TSH, T4TOTAL, T3FREE, THYROIDAB in the last 72 hours.  Invalid input(s): FREET3 Anemia work up No results for input(s): VITAMINB12, FOLATE, FERRITIN, TIBC, IRON, RETICCTPCT in the last 72 hours. Urinalysis     Component Value Date/Time   COLORURINE YELLOW (A) 04/03/2016 1153   APPEARANCEUR HAZY (A) 04/03/2016 1153   APPEARANCEUR Clear 07/18/2012 2128   LABSPEC 1.013 04/03/2016 1153   LABSPEC 1.013 07/18/2012 2128   PHURINE 5.0 04/03/2016 1153   GLUCOSEU 50 (A) 04/03/2016 1153   GLUCOSEU Negative 07/18/2012 2128   HGBUR NEGATIVE 04/03/2016 1153   BILIRUBINUR NEGATIVE 04/03/2016 1153   BILIRUBINUR Negative 07/18/2012 2128   KETONESUR NEGATIVE 04/03/2016 1153   PROTEINUR >=300 (A) 04/03/2016 1153   NITRITE NEGATIVE 04/03/2016 1153   LEUKOCYTESUR MODERATE (A) 04/03/2016 1153   LEUKOCYTESUR 2+ 07/18/2012 2128   Sepsis Labs Invalid input(s): PROCALCITONIN,  WBC,  LACTICIDVEN Microbiology Recent Results (from the past 240 hour(s))  Culture, Urine     Status: Abnormal   Collection Time: 04/29/16  8:25 PM  Result Value Ref Range Status   Specimen Description URINE, RANDOM  Final   Special Requests NONE  Final   Culture (A)  Final    50,000 COLONIES/mL PSEUDOMONAS AERUGINOSA 20,000 COLONIES/mL ENTEROCOCCUS FAECALIS    Report Status 05/03/2016 FINAL  Final   Organism ID, Bacteria PSEUDOMONAS AERUGINOSA (A)  Final   Organism ID, Bacteria ENTEROCOCCUS FAECALIS (A)  Final      Susceptibility   Enterococcus faecalis - MIC*    AMPICILLIN <=2 SENSITIVE Sensitive     LEVOFLOXACIN >=8 RESISTANT Resistant     NITROFURANTOIN <=16 SENSITIVE Sensitive     VANCOMYCIN 1 SENSITIVE Sensitive     * 20,000 COLONIES/mL ENTEROCOCCUS FAECALIS   Pseudomonas aeruginosa - MIC*    CEFTAZIDIME 4 SENSITIVE Sensitive     CIPROFLOXACIN <=0.25 SENSITIVE Sensitive     GENTAMICIN <=1 SENSITIVE Sensitive     IMIPENEM 2 SENSITIVE Sensitive     PIP/TAZO 8 SENSITIVE Sensitive     CEFEPIME 2 SENSITIVE Sensitive     *  50,000 COLONIES/mL PSEUDOMONAS AERUGINOSA     Time coordinating discharge: Over 30 minutes  SIGNED:   Alba Coryegalado, Brittin Belnap A, MD  [facility] 05/04/2016, 2:21 PM Pager [phone]  If  7PM-7AM, please contact night-coverage [email or web address] Password [email or web address]

## 2016-05-08 ENCOUNTER — Other Ambulatory Visit: Payer: Self-pay | Admitting: Internal Medicine

## 2016-05-09 LAB — BASIC METABOLIC PANEL
BUN: 43 mg/dL — AB (ref 4–21)
Creatinine: 1.8 mg/dL — AB (ref 0.6–1.3)
Glucose: 132 mg/dL
Potassium: 3.1 mmol/L — AB (ref 3.4–5.3)
Sodium: 137 mmol/L (ref 137–147)

## 2016-05-12 ENCOUNTER — Encounter: Payer: Self-pay | Admitting: Internal Medicine

## 2016-05-12 ENCOUNTER — Non-Acute Institutional Stay (SKILLED_NURSING_FACILITY): Payer: Medicare Other | Admitting: Internal Medicine

## 2016-05-12 DIAGNOSIS — Z794 Long term (current) use of insulin: Secondary | ICD-10-CM

## 2016-05-12 DIAGNOSIS — I82412 Acute embolism and thrombosis of left femoral vein: Secondary | ICD-10-CM | POA: Insufficient documentation

## 2016-05-12 DIAGNOSIS — J441 Chronic obstructive pulmonary disease with (acute) exacerbation: Secondary | ICD-10-CM | POA: Diagnosis not present

## 2016-05-12 DIAGNOSIS — S7002XA Contusion of left hip, initial encounter: Secondary | ICD-10-CM | POA: Diagnosis not present

## 2016-05-12 DIAGNOSIS — E1140 Type 2 diabetes mellitus with diabetic neuropathy, unspecified: Secondary | ICD-10-CM | POA: Diagnosis not present

## 2016-05-12 LAB — BASIC METABOLIC PANEL
BUN: 42 mg/dL — AB (ref 4–21)
CREATININE: 2 mg/dL — AB (ref 0.6–1.3)
Glucose: 137 mg/dL
Potassium: 4.2 mmol/L (ref 3.4–5.3)
Sodium: 140 mmol/L (ref 137–147)

## 2016-05-12 NOTE — Progress Notes (Signed)
Location:  [facility] Room Number: 1203-P Place of Service:  SNF [phone]) Provider:  Laren Boom, MD  Patient Care Team: Lynnea Ferrier, MD as PCP - General (Internal Medicine)  Extended Emergency Contact Information Primary Emergency Contact: [facility] Address: [address]          [place], [place] [address] [name] of [place] Home Phone: [phone] Relation: Spouse Secondary Emergency Contact: Jones Skene [place] Home Phone: [phone] Mobile Phone: [phone] Relation: Son  Code Status:  DNR Goals of care: Advanced Directive information Advanced Directives 04/30/2016  Does Patient Have a Medical Advance Directive? Yes  Type of Advance Directive Out of facility DNR (pink MOST or yellow form)  Does patient want to make changes to medical advance directive? No - Patient declined  Copy of Healthcare Power of Attorney in Chart? -  Pre-existing out of facility DNR order (yellow form or pink MOST form) Yellow form placed in chart (order not valid for inpatient use)     Chief Complaint  Patient presents with  . Readmit To SNF    Readmission Visit     HPI:  Pt is a 83 y.o. male seen today for medical management of chronic diseases.   Patient has h/o Vascular dementia, Hypertension, Diabetes, Hyperlipidemia,  Severe COPD, BPH, DVT after Hip surgery, S/P Left hip arthroplasty in 11/17 followed by Displaced Prosthesis required reduction and Hematoma all due to falls.  He was in the facility  when he started to get SOB.He was admitted with COPD exacerbation with respiratory failure. He was treated with Prednisone and Antibiotics. He also had problems with Hyperglycemia due to Prednisone. He also has Left LE DVT on Lovenox as he had Hematoma of his Hip. Patient is back in the facility. He is unable to give me any history. His wife is there and thinks he is not having any issues.He does have chronic  SOB and some left hip pain. His wife wants him to stay in SNF as she is unable to take care of him at home and is at risk of recurrent falls and is very non compliant. He has lost almost 45 lbs in past 6 months.   Past Medical History:  Diagnosis Date  . Abnormal echocardiogram   . Asymptomatic bilateral carotid artery stenosis   . Bilateral knee pain   . BPH (benign prostatic hyperplasia)   . Bruit of right carotid artery   . Chronic rhinitis   . COPD (chronic obstructive pulmonary disease) (HCC)   . Diabetic macular edema (HCC)   . DM (diabetes mellitus) (HCC)   . Dysplasia of prostate   . Edema   . Hyperlipidemia   . Hypertension   . NPDR (nonproliferative diabetic retinopathy) (HCC)   . Osteoarthritis   . Personal history of other malignant neoplasm of skin   . Primary osteoarthritis of both knees   . Pseudophakia of both eyes    Past Surgical History:  Procedure Laterality Date  . ANKLE SURGERY    . COLECTOMY    . COLON SURGERY    . HIP ARTHROPLASTY Left 02/10/2016   Procedure: ARTHROPLASTY BIPOLAR HIP (HEMIARTHROPLASTY);  Surgeon: Christena Flake, MD;  Location: [facility] ORS;  Service: Orthopedics;  Laterality: Left;    No Known Allergies  Allergies as of 05/12/2016   No Known Allergies     Medication List       Accurate as of 05/12/16 11:54 AM.  Always use your most recent med list.          acetaminophen 500 MG tablet Commonly known as:  TYLENOL Take 500 mg by mouth every 6 (six) hours as needed for mild pain or fever.   acidophilus Caps capsule Take 1 capsule by mouth daily.   amLODipine 10 MG tablet Commonly known as:  NORVASC Take 1 tablet (10 mg total) by mouth daily.   atorvastatin 20 MG tablet Commonly known as:  LIPITOR Take 20 mg by mouth daily.   budesonide-formoterol 160-4.5 MCG/ACT inhaler Commonly known as:  SYMBICORT Inhale 2 puffs into the lungs 2 (two) times daily.   cloNIDine 0.1 mg/24hr patch Commonly known as:  CATAPRES - Dosed in  mg/24 hr Place 0.1 mg onto the skin once a week.   DECUBI-VITE PO Take 1 capsule by mouth daily.   docusate sodium 100 MG capsule Commonly known as:  COLACE Take 1 capsule (100 mg total) by mouth 2 (two) times daily.   donepezil 5 MG tablet Commonly known as:  ARICEPT Take 5 mg by mouth at bedtime.   enoxaparin 100 MG/ML injection Commonly known as:  LOVENOX Inject 0.5 mLs (50 mg total) into the skin at bedtime.   feeding supplement (PRO-STAT SUGAR FREE 64) Liqd Take 30 mLs by mouth daily. Stop date 06/09/16   ferrous sulfate 325 (65 FE) MG tablet Take 1 tablet (325 mg total) by mouth 3 (three) times daily after meals.   fluticasone 50 MCG/ACT nasal spray Commonly known as:  FLONASE Place 2 sprays into both nostrils daily.   ICY HOT LIDOCAINE PLUS MENTHOL 4-1 % Ptch Generic drug:  Lidocaine-Menthol Apply 2 patches topically every morning. To left hip remove after 12 hours   insulin detemir 100 UNIT/ML injection Commonly known as:  LEVEMIR Inject 0.05 mLs (5 Units total) into the skin at bedtime.   insulin lispro 100 UNIT/ML injection Commonly known as:  HUMALOG Inject 0.02 mLs (2 Units total) into the skin 3 (three) times daily before meals.   ipratropium-albuterol 0.5-2.5 (3) MG/3ML Soln Commonly known as:  DUONEB Take 3 mLs by nebulization 2 (two) times daily.   loperamide 2 MG capsule Commonly known as:  IMODIUM Take 2 mg by mouth daily as needed for diarrhea or loose stools.   Melatonin 3 MG Tabs Take 1 tablet by mouth at bedtime.   metoprolol 50 MG tablet Commonly known as:  LOPRESSOR Take 1 tablet (50 mg total) by mouth 2 (two) times daily.   predniSONE 20 MG tablet Commonly known as:  DELTASONE Take 2 tablets (40 mg total) by mouth daily with breakfast.   PROAIR HFA 108 (90 Base) MCG/ACT inhaler Generic drug:  albuterol Inhale 2 puffs into the lungs every 6 (six) hours as needed for wheezing or shortness of breath.   traMADol 50 MG tablet Commonly  known as:  ULTRAM Take 50 mg by mouth 3 (three) times daily. Hold if sleepy or confused   triamcinolone cream 0.1 % Commonly known as:  KENALOG Apply 1 application topically 2 (two) times daily.   UNABLE TO FIND Med Name: Med pass 90 mL by mouth 2 times daily. Stop date 06/05/16   Vitamin D3 2000 units capsule Take 1 capsule by mouth daily.       Review of Systems  Reason unable to perform ROS: Patietn not reliable due to his dementia.    Immunization History  Administered Date(s) Administered  . PPD Test 04/08/2016, 04/26/2016, 04/29/2016   Pertinent  Health Maintenance Due  Topic Date Due  . FOOT EXAM  11/30/1943  . OPHTHALMOLOGY EXAM  11/30/1943  . URINE MICROALBUMIN  11/30/1943  . PNA vac Low Risk Adult (1 of 2 - PCV13) 11/30/2000  . INFLUENZA VACCINE  10/20/2015  . HEMOGLOBIN A1C  10/27/2016   Fall Risk  06/15/2015 05/18/2015  Falls in the past year? Yes Yes  Number falls in past yr: - 2 or more  Injury with Fall? - Yes  Risk Factor Category  - High Fall Risk  Risk for fall due to : - History of fall(s)  Follow up - Education provided;Falls prevention discussed   Functional Status Survey:    Vitals:   05/12/16 1057  BP: (!) 142/80  Pulse: 64  Resp: 20  Temp: (!) 96.8 F (36 C)  TempSrc: Oral  SpO2: 97%  Weight: 114 lb 9.6 oz (52 kg)  Height: 5\' 8"  (1.727 m)   Body mass index is 17.42 kg/m. Physical Exam  Constitutional: He appears well-developed. He appears cachectic.  HENT:  Head: Normocephalic.  Eyes: Pupils are equal, round, and reactive to light.  Neck: Neck supple.  Cardiovascular: Normal rate, regular rhythm and normal heart sounds.   No murmur heard. Pulmonary/Chest: Effort normal.  Very little air movement b/l. No wheezing.  Abdominal: Soft. Bowel sounds are normal. He exhibits no distension. There is no tenderness. There is no rebound.  Musculoskeletal:  Mild edema B/L  Lymphadenopathy:    He has no cervical adenopathy.    Neurological: He is alert.  Not oriented to time or place  Skin: Skin is warm and dry.  Psychiatric: He has a normal mood and affect. His behavior is normal.    Labs reviewed:  Recent Labs  05/01/16 0710 05/02/16 0019 05/03/16 0533 05/09/16  NA 136 131* 140 137  K 3.8 3.9 4.2 3.1*  CL 107 98* 103  --   CO2 19* 21* 26  --   GLUCOSE 270* 534* 129*  --   BUN 45* 56* 58* 43*  CREATININE 2.15* 2.40* 2.25* 1.8*  CALCIUM 8.3* 7.9* 8.3*  --     Recent Labs  02/09/16 1250 04/02/16 1749  AST 22 19  ALT 14* 11*  ALKPHOS 70 79  BILITOT 0.9 0.3  PROT 6.2* 6.1*  ALBUMIN 3.2* 2.8*    Recent Labs  04/02/16 1749 04/03/16 1153  04/29/16 1034  05/01/16 0710 05/02/16 0024 05/03/16 0533  WBC 5.7 7.3  < > 5.2  < > 13.3* 10.4 9.3  NEUTROABS 4.1 5.4  --  3.8  --   --   --   --   HGB 9.9* 7.7*  < > 9.5*  < > 10.5* 9.0* 9.6*  HCT 29.0* 22.7*  < > 29.3*  < > 29.9* 27.2* 29.3*  MCV 97.0 97.7  < > 94.2  < > 90.3 92.2 92.4  PLT 227 194  < > 268  < > 279 267 274  < > = values in this interval not displayed. No results found for: TSH Lab Results  Component Value Date   HGBA1C 7.5 (H) 04/29/2016   No results found for: CHOL, HDL, LDLCALC, LDLDIRECT, TRIG, CHOLHDL  Significant Diagnostic Results in last 30 days:  Dg Chest 2 View  Result Date: 04/29/2016 CLINICAL DATA:  Shortness of Breath EXAM: CHEST  2 VIEW COMPARISON:  02/11/2016 FINDINGS: There is hyperinflation of the lungs compatible with COPD. Heart and mediastinal contours are within normal limits. No focal opacities or effusions.  No acute bony abnormality. IMPRESSION: COPD.  No active disease. Electronically Signed   By: Charlett Nose M.D.   On: 04/29/2016 11:25   Nm Pulmonary Perf And Vent  Result Date: 04/29/2016 CLINICAL DATA:  Short of breath.  DVT. EXAM: NUCLEAR MEDICINE VENTILATION - PERFUSION LUNG SCAN TECHNIQUE: Ventilation images were obtained in multiple projections using inhaled aerosol Tc-31m DTPA. Perfusion images  were obtained in multiple projections after intravenous injection of Tc-25m MAA. RADIOPHARMACEUTICALS:  Thirty-one mCi Technetium-27m DTPA aerosol inhalation and 4.2 mCi Technetium-26m MAA IV COMPARISON:  Radiograph 04/29/2016 FINDINGS: Ventilation: Extremely heterogeneous and patchy ventilation pattern within LEFT RIGHT lung. Focal decreased ventilation to the RIGHT upper lobe. Perfusion: Decrease regional perfusion to the RIGHT upper lobe matches the ventilation defect. Smaller defect within the lingula is also match. No un matched wedge-shaped peripheral perfusion defects to localize pulmonary emboli. IMPRESSION: Heterogeneous ventilation and profusion pattern with out matched defects is most consistent COPD pattern. No evidence of acute pulmonary embolism. Electronically Signed   By: Genevive Bi M.D.   On: 04/29/2016 16:52    Assessment/Plan  COPD exacerbation (HCC) Patient is on Prednisone which was never stopped. Will taper his prednisone. Continue Nebs. Oxygen supplement If pox less then 90%  Type 2 diabetes mellitus  BS right now in facility are very sporadic b/w 150- 300. Will continue same dose as tapering the prednisone. Continue accu check. Sliding scale for higher sugars.  Hip hematoma,  He is in Knee immobilizer. Patient pain is controlled on ultram. He needs follow up with Ortho  Acute deep vein thrombosis (DVT) of femoral vein of left lower extremity  On lovenox. Will have pharmacy dose it. Cannot transition to oral anticoagulants till he has appointment with ortho  Hypertension He is on clonidine, Metoprolol and Norvasc. Failure to thrive Patient is getting more difficult to manage due to his non compliance and has lost weight. His prognosis is poor. Will also start him on antidepressant to see if it helps his mood. He is already on Aricept. Patient would need Long term care. D/W wife.  Family/ staff Communication:   Labs/tests ordered:

## 2016-05-16 ENCOUNTER — Encounter: Payer: Self-pay | Admitting: Family

## 2016-05-26 LAB — BASIC METABOLIC PANEL
BUN: 41 mg/dL — AB (ref 4–21)
CREATININE: 2.4 mg/dL — AB (ref 0.6–1.3)
Glucose: 20 mg/dL
Potassium: 3.4 mmol/L (ref 3.4–5.3)
SODIUM: 145 mmol/L (ref 137–147)

## 2016-06-03 ENCOUNTER — Encounter: Payer: Self-pay | Admitting: Family

## 2016-06-03 ENCOUNTER — Non-Acute Institutional Stay (SKILLED_NURSING_FACILITY): Payer: Medicare Other | Admitting: Family

## 2016-06-03 DIAGNOSIS — E1100 Type 2 diabetes mellitus with hyperosmolarity without nonketotic hyperglycemic-hyperosmolar coma (NKHHC): Secondary | ICD-10-CM | POA: Diagnosis not present

## 2016-06-03 DIAGNOSIS — R634 Abnormal weight loss: Secondary | ICD-10-CM | POA: Diagnosis not present

## 2016-06-03 DIAGNOSIS — R630 Anorexia: Secondary | ICD-10-CM | POA: Diagnosis not present

## 2016-06-03 MED ORDER — MIRTAZAPINE 7.5 MG PO TABS: 7.5000 mg | ORAL_TABLET | Freq: Every day | ORAL | Status: DC

## 2016-06-03 NOTE — Progress Notes (Signed)
Location:  [facility] Room Number: 505 B Place of Service:  SNF (31) Provider: Dinah Ngetich FNP-C   [name], MD  Patient Care Team: Lynnea FerrierBert J Klein III, MD as PCP - General (Internal Medicine)  Extended Emergency Contact Information [facility] Contact: [name] Address: [address]          [place], [place] [address] [place] Home Phone: [phone] Relation: Spouse Secondary Emergency Contact: [name],[name]  [place] Home Phone: [phone] Mobile Phone: [phone] Relation: Son  Code Status:  DNR  Goals of care: Advanced Directive information Advanced Directives 06/03/2016  Does Patient Have a Medical Advance Directive? Yes  Type of Advance Directive Out of facility DNR (pink MOST or yellow form)  Does patient want to make changes to medical advance directive? -  Copy of Healthcare Power of Attorney in Chart? -  Pre-existing out of facility DNR order (yellow form or pink MOST form) Yellow form placed in chart (order not valid for inpatient use)     Chief Complaint  Patient presents with  . Medical Management of Chronic Issues    Routine Visit    HPI:  Pt is a 83 y.o. male seen today for an acute visit for evaluation of elevated blood pressure. He has a significant medical history of HTN, Hyperlipidemia, Type 2 DM, OA among other conditions.He is seen in his room today. He states left hip pain under control with current pain regimen.He continues to follow up with in house PMR specialist. He also continue to work with therapy though therapy staff reports patient has generalized weakness unable to participate. He has lost 15 pounds weight loss over one month. He was seen 06/02/2016 by facility Registered Dietician Med pass 240 cc daily ordered and patient to continue on Prostat unitl 07/10/2016. He has a stage 2 sacral ulcer managed by wound care Nurse.His CBG's ranging in the 70's -300's.  Levemir recently discontinued due to multiple episodes of hypoglycemia.He states has no appetite.   Past Medical History:  Diagnosis Date  . Abnormal echocardiogram   . Asymptomatic bilateral carotid artery stenosis   . Bilateral knee pain   . BPH (benign prostatic hyperplasia)   . Bruit of right carotid artery   . Chronic rhinitis   . COPD (chronic obstructive pulmonary disease) (HCC)   . Diabetic macular edema (HCC)   . DM (diabetes mellitus) (HCC)   . Dysplasia of prostate   . Edema   . Hyperlipidemia   . Hypertension   . NPDR (nonproliferative diabetic retinopathy) (HCC)   . Osteoarthritis   . Personal history of other malignant neoplasm of skin   . Primary osteoarthritis of both knees   . Pseudophakia of both eyes    Past Surgical History:  Procedure Laterality Date  . ANKLE SURGERY    . COLECTOMY    . COLON SURGERY    . HIP ARTHROPLASTY Left 02/10/2016   Procedure: ARTHROPLASTY BIPOLAR HIP (HEMIARTHROPLASTY);  Surgeon: Christena FlakeJohn J Poggi, MD;  Location: [facility] ORS;  Service: Orthopedics;  Laterality: Left;    No Known Allergies  Allergies as of 06/03/2016   No Known Allergies     Medication List       Accurate as of 06/03/16  4:31 PM. Always use your most recent med list.          acetaminophen 500 MG tablet Commonly known as:  TYLENOL Take 500 mg by mouth every 6 (six) hours as  needed for mild pain or fever.   amLODipine 10 MG tablet Commonly known as:  NORVASC Take 1 tablet (10 mg total) by mouth daily.   atorvastatin 20 MG tablet Commonly known as:  LIPITOR Take 20 mg by mouth daily.   budesonide-formoterol 160-4.5 MCG/ACT inhaler Commonly known as:  SYMBICORT Inhale 2 puffs into the lungs 2 (two) times daily.   CALCIUM CITRATE + D PO Take 1 tablet by mouth 2 (two) times daily.   citalopram 20 MG tablet Commonly known as:  CELEXA Take 20 mg by mouth daily.   cloNIDine 0.1 mg/24hr patch Commonly known as:  CATAPRES - Dosed in mg/24 hr Place 0.1 mg  onto the skin once a week.   DECUBI-VITE PO Take 1 capsule by mouth daily.   divalproex 125 MG capsule Commonly known as:  DEPAKOTE SPRINKLE Take 125 mg by mouth 2 (two) times daily.   docusate sodium 100 MG capsule Commonly known as:  COLACE Take 1 capsule (100 mg total) by mouth 2 (two) times daily.   donepezil 5 MG tablet Commonly known as:  ARICEPT Take 5 mg by mouth at bedtime.   enoxaparin 30 MG/0.3ML injection Commonly known as:  LOVENOX Inject 30 mg into the skin every 12 (twelve) hours.   feeding supplement (PRO-STAT SUGAR FREE 64) Liqd Take 30 mLs by mouth daily. Stop date 06/09/16   ferrous sulfate 325 (65 FE) MG tablet Take 1 tablet (325 mg total) by mouth 3 (three) times daily after meals.   fluticasone 50 MCG/ACT nasal spray Commonly known as:  FLONASE Place 2 sprays into both nostrils daily.   ICY HOT LIDOCAINE PLUS MENTHOL 4-1 % Ptch Generic drug:  Lidocaine-Menthol Apply 2 patches topically every morning. To left hip remove after 12 hours   insulin aspart 100 UNIT/ML injection Commonly known as:  novoLOG Inject 2-8 Units into the skin 3 (three) times daily before meals.   insulin lispro 100 UNIT/ML injection Commonly known as:  HUMALOG Inject 0.02 mLs (2 Units total) into the skin 3 (three) times daily before meals.   ipratropium-albuterol 0.5-2.5 (3) MG/3ML Soln Commonly known as:  DUONEB Take 3 mLs by nebulization 2 (two) times daily.   loperamide 2 MG capsule Commonly known as:  IMODIUM Take 2 mg by mouth daily as needed for diarrhea or loose stools.   Melatonin 3 MG Tabs Take 1 tablet by mouth at bedtime.   metoprolol 50 MG tablet Commonly known as:  LOPRESSOR Take 1 tablet (50 mg total) by mouth 2 (two) times daily.   PROAIR HFA 108 (90 Base) MCG/ACT inhaler Generic drug:  albuterol Inhale 2 puffs into the lungs every 6 (six) hours as needed for wheezing or shortness of breath.   traMADol 50 MG tablet Commonly known as:  ULTRAM Take  100 mg by mouth every 8 (eight) hours.   traMADol 50 MG tablet Commonly known as:  ULTRAM Take 50 mg by mouth every 6 (six) hours as needed.   triamcinolone cream 0.1 % Commonly known as:  KENALOG Apply 1 application topically 2 (two) times daily.   Vitamin D3 2000 units capsule Take 1 capsule by mouth daily.       Review of Systems  Constitutional: Positive for appetite change. Negative for activity change, chills, fatigue and fever.  HENT: Negative for congestion, rhinorrhea, sinus pain, sinus pressure, sneezing and sore throat.   Eyes: Negative.   Respiratory: Negative for cough, chest tightness, shortness of breath and wheezing.   Cardiovascular: Negative  for chest pain, palpitations and leg swelling.  Gastrointestinal: Negative for abdominal distention, abdominal pain, constipation, diarrhea, nausea and vomiting.  Endocrine: Negative for cold intolerance, heat intolerance, polydipsia, polyphagia and polyuria.  Genitourinary: Negative for dysuria, flank pain, frequency and urgency.  Musculoskeletal: Positive for gait problem.       Left hip pain under control with current meds.   Skin: Negative for color change, pallor and rash.       Sacral ulcer managed by wound Nurse   Neurological: Negative for dizziness, seizures, syncope, light-headedness and headaches.  Hematological: Does not bruise/bleed easily.  Psychiatric/Behavioral: Negative for agitation, confusion, hallucinations and sleep disturbance. The patient is not nervous/anxious.     Immunization History  Administered Date(s) Administered  . PPD Test 04/08/2016, 04/26/2016, 04/29/2016   Pertinent  Health Maintenance Due  Topic Date Due  . FOOT EXAM  11/30/1943  . OPHTHALMOLOGY EXAM  11/30/1943  . URINE MICROALBUMIN  11/30/1943  . PNA vac Low Risk Adult (1 of 2 - PCV13) 11/30/2000  . INFLUENZA VACCINE  10/20/2015  . HEMOGLOBIN A1C  10/27/2016   Fall Risk  06/15/2015 05/18/2015  Falls in the past year? Yes Yes    Number falls in past yr: - 2 or more  Injury with Fall? - Yes  Risk Factor Category  - High Fall Risk  Risk for fall due to : - History of fall(s)  Follow up - Education provided;Falls prevention discussed    Vitals:   06/03/16 1000  BP: 117/67  Pulse: 74  Resp: 18  Temp: 98.2 F (36.8 C)  SpO2: 96%  Weight: 98 lb 8 oz (44.7 kg)  Height: 5\' 6"  (1.676 m)   Body mass index is 15.9 kg/m. Physical Exam  Constitutional:  Thin frail elderly in no acute distress  HENT:  Head: Normocephalic.  Mouth/Throat: Oropharynx is clear and moist. No oropharyngeal exudate.  Eyes: Conjunctivae and EOM are normal. Pupils are equal, round, and reactive to light. Right eye exhibits no discharge. Left eye exhibits no discharge. No scleral icterus.  Neck: Normal range of motion. No JVD present. No thyromegaly present.  Cardiovascular: Normal rate, regular rhythm, normal heart sounds and intact distal pulses.  Exam reveals no gallop and no friction rub.   No murmur heard. Pulmonary/Chest: Effort normal and breath sounds normal. No respiratory distress. He has no wheezes. He has no rales.  Abdominal: Soft. Bowel sounds are normal. He exhibits no distension. There is no tenderness. There is no rebound and no guarding.  Genitourinary:  Genitourinary Comments: Incontinent   Musculoskeletal: He exhibits no edema or tenderness.  Moves x 4 extremities. unsteady gait.generalized weakness.   Lymphadenopathy:    He has no cervical adenopathy.  Neurological: He is alert.  Skin: Skin is warm and dry. No rash noted. No erythema. No pallor.  Sacral stage 2 ulcer; wound bed pink in color without any signs of infections.Surrounding skin tissue without any signs of infections.   Psychiatric: He has a normal mood and affect.    Labs reviewed:  Recent Labs  05/01/16 0710 05/02/16 0019 05/03/16 0533 05/09/16 05/12/16 05/26/16  NA 136 131* 140 137 140 145  K 3.8 3.9 4.2 3.1* 4.2 3.4  CL 107 98* 103  --   --    --   CO2 19* 21* 26  --   --   --   GLUCOSE 270* 534* 129*  --   --   --   BUN 45* 56* 58* 43* 42* 41*  CREATININE 2.15* 2.40* 2.25* 1.8* 2.0* 2.4*  CALCIUM 8.3* 7.9* 8.3*  --   --   --     Recent Labs  02/09/16 1250 04/02/16 1749  AST 22 19  ALT 14* 11*  ALKPHOS 70 79  BILITOT 0.9 0.3  PROT 6.2* 6.1*  ALBUMIN 3.2* 2.8*    Recent Labs  04/02/16 1749 04/03/16 1153  04/29/16 1034  05/01/16 0710 05/02/16 0024 05/03/16 0533  WBC 5.7 7.3  < > 5.2  < > 13.3* 10.4 9.3  NEUTROABS 4.1 5.4  --  3.8  --   --   --   --   HGB 9.9* 7.7*  < > 9.5*  < > 10.5* 9.0* 9.6*  HCT 29.0* 22.7*  < > 29.3*  < > 29.9* 27.2* 29.3*  MCV 97.0 97.7  < > 94.2  < > 90.3 92.2 92.4  PLT 227 194  < > 268  < > 279 267 274  < > = values in this interval not displayed.  Assessment/Plan   Poor appetite  Reports no appetite. Will start Remeron 7.5 mg Tablet one by mouth at bedtime. Continue to monitor.Facility staff to feed with all meals. Check TSH level 06/06/2016.   Type 2 DM  CBG's ranging in the 70's-300's.Levemir recently discontinued due to hypoglycemia episodes. Will continue on Humalog per SSI for now due to his poor appetite. Check Hgb A1C 06/06/2016.continue to monitor.    Weight loss  Has had a 15 pounds weight loss over one month. Poor appetite reported.  Facility staff to assist with feeding for all meals. Seen by Registered Dietician 06/02/2016 Med pass 240 cc daily ordered. Continue Prostat unitl 07/10/2016. Will check TSH level 06/06/2016    Family/ staff Communication: Reviewed plan of care with patient, facility Nurse and supervisor.   Labs/tests ordered: TSH level and Hgb A1C 06/06/2016

## 2016-06-08 LAB — BASIC METABOLIC PANEL
BUN: 71 mg/dL — AB (ref 4–21)
GLUCOSE: 91 mg/dL
SODIUM: 144 mmol/L (ref 137–147)

## 2016-06-09 ENCOUNTER — Non-Acute Institutional Stay (SKILLED_NURSING_FACILITY): Payer: Medicare Other | Admitting: Family

## 2016-06-09 DIAGNOSIS — R5381 Other malaise: Secondary | ICD-10-CM | POA: Diagnosis not present

## 2016-06-09 DIAGNOSIS — R630 Anorexia: Secondary | ICD-10-CM | POA: Diagnosis not present

## 2016-06-09 NOTE — Progress Notes (Signed)
Location:  [facility] Room Number: [id number]  Place of Service:  SNF (31) Provider: Aileana Hodder FNP-C   [name], MD  Patient Care Team: Lynnea FerrierBert J Klein III, MD as PCP - General (Internal Medicine)  Extended Emergency Contact Information [facility] Emergency Contact: [name] Address: [address]          [place], [place] [address] [place] Home Phone: [phone] Relation: Spouse Secondary Emergency Contact: [name],[name]  [place] Home Phone: [phone] Mobile Phone: [phone] Relation: Son  Code Status:  DNR  Goals of care: Advanced Directive information Advanced Directives 06/03/2016  Does Patient Have a Medical Advance Directive? Yes  Type of Advance Directive Out of facility DNR (pink MOST or yellow form)  Does patient want to make changes to medical advance directive? -  Copy of Healthcare Power of Attorney in Chart? -  Pre-existing out of facility DNR order (yellow form or pink MOST form) Yellow form placed in chart (order not valid for inpatient use)     Chief Complaint  Patient presents with  . Acute Visit    Poor oral intake     HPI:  Pt is a 83 y.o. male seen today for an acute visit for evaluation of elevated blood pressure. He has a significant medical history of HTN, Hyperlipidemia, Type 2 DM, OA among other conditions.He is seen in his room today. He states left hip pain under control with current pain regimen.He continues to follow up with in house PMR specialist. He also continue to work with therapy though therapy staff reports patient has generalized weakness unable to participate. He has lost 15 pounds weight loss over one month. He was seen 06/02/2016 by facility Registered Dietician Med pass 240 cc daily ordered and patient to continue on Prostat unitl 07/10/2016. He has a stage 2 sacral ulcer managed by wound care Nurse.His CBG's ranging in the 70's -300's. Levemir recently  discontinued due to multiple episodes of hypoglycemia.He states has no appetite.   Past Medical History:  Diagnosis Date  . Abnormal echocardiogram   . Asymptomatic bilateral carotid artery stenosis   . Bilateral knee pain   . BPH (benign prostatic hyperplasia)   . Bruit of right carotid artery   . Chronic rhinitis   . COPD (chronic obstructive pulmonary disease) (HCC)   . Diabetic macular edema (HCC)   . DM (diabetes mellitus) (HCC)   . Dysplasia of prostate   . Edema   . Hyperlipidemia   . Hypertension   . NPDR (nonproliferative diabetic retinopathy) (HCC)   . Osteoarthritis   . Personal history of other malignant neoplasm of skin   . Primary osteoarthritis of both knees   . Pseudophakia of both eyes    Past Surgical History:  Procedure Laterality Date  . ANKLE SURGERY    . COLECTOMY    . COLON SURGERY    . HIP ARTHROPLASTY Left 02/10/2016   Procedure: ARTHROPLASTY BIPOLAR HIP (HEMIARTHROPLASTY);  Surgeon: Christena FlakeJohn J Poggi, MD;  Location: [facility] ORS;  Service: Orthopedics;  Laterality: Left;    No Known Allergies  Allergies as of 06/09/2016   No Known Allergies     Medication List       Accurate as of 06/09/16  6:06 PM. Always use your most recent med list.          acetaminophen 500 MG tablet Commonly known as:  TYLENOL Take 500 mg by mouth every 6 (six) hours as needed  for mild pain or fever.   amLODipine 10 MG tablet Commonly known as:  NORVASC Take 1 tablet (10 mg total) by mouth daily.   atorvastatin 20 MG tablet Commonly known as:  LIPITOR Take 20 mg by mouth daily.   budesonide-formoterol 160-4.5 MCG/ACT inhaler Commonly known as:  SYMBICORT Inhale 2 puffs into the lungs 2 (two) times daily.   CALCIUM CITRATE + D PO Take 1 tablet by mouth 2 (two) times daily.   citalopram 20 MG tablet Commonly known as:  CELEXA Take 20 mg by mouth daily.   cloNIDine 0.1 mg/24hr patch Commonly known as:  CATAPRES - Dosed in mg/24 hr Place 0.1 mg onto the skin  once a week.   DECUBI-VITE PO Take 1 capsule by mouth daily.   divalproex 125 MG capsule Commonly known as:  DEPAKOTE SPRINKLE Take 125 mg by mouth 2 (two) times daily.   docusate sodium 100 MG capsule Commonly known as:  COLACE Take 1 capsule (100 mg total) by mouth 2 (two) times daily.   donepezil 5 MG tablet Commonly known as:  ARICEPT Take 5 mg by mouth at bedtime.   enoxaparin 30 MG/0.3ML injection Commonly known as:  LOVENOX Inject 30 mg into the skin every 12 (twelve) hours.   feeding supplement (PRO-STAT SUGAR FREE 64) Liqd Take 30 mLs by mouth daily. Stop date 06/09/16   ferrous sulfate 325 (65 FE) MG tablet Take 1 tablet (325 mg total) by mouth 3 (three) times daily after meals.   fluticasone 50 MCG/ACT nasal spray Commonly known as:  FLONASE Place 2 sprays into both nostrils daily.   ICY HOT LIDOCAINE PLUS MENTHOL 4-1 % Ptch Generic drug:  Lidocaine-Menthol Apply 2 patches topically every morning. To left hip remove after 12 hours   insulin lispro 100 UNIT/ML injection Commonly known as:  HUMALOG Inject 0.02 mLs (2 Units total) into the skin 3 (three) times daily before meals.   ipratropium-albuterol 0.5-2.5 (3) MG/3ML Soln Commonly known as:  DUONEB Take 3 mLs by nebulization 2 (two) times daily.   loperamide 2 MG capsule Commonly known as:  IMODIUM Take 2 mg by mouth daily as needed for diarrhea or loose stools.   Melatonin 3 MG Tabs Take 1 tablet by mouth at bedtime.   metoprolol 50 MG tablet Commonly known as:  LOPRESSOR Take 1 tablet (50 mg total) by mouth 2 (two) times daily.   mirtazapine 7.5 MG tablet Commonly known as:  REMERON Take 1 tablet (7.5 mg total) by mouth at bedtime.   PROAIR HFA 108 (90 Base) MCG/ACT inhaler Generic drug:  albuterol Inhale 2 puffs into the lungs every 6 (six) hours as needed for wheezing or shortness of breath.   traMADol 50 MG tablet Commonly known as:  ULTRAM Take 100 mg by mouth every 8 (eight) hours.     traMADol 50 MG tablet Commonly known as:  ULTRAM Take 50 mg by mouth every 6 (six) hours as needed.   triamcinolone cream 0.1 % Commonly known as:  KENALOG Apply 1 application topically 2 (two) times daily.   Vitamin D3 2000 units capsule Take 1 capsule by mouth daily.       Review of Systems  Constitutional: Positive for appetite change. Negative for activity change, chills, fatigue and fever.  HENT: Negative for congestion, rhinorrhea, sinus pain, sinus pressure, sneezing and sore throat.   Eyes: Negative.   Respiratory: Negative for cough, chest tightness, shortness of breath and wheezing.   Cardiovascular: Negative for chest pain,  palpitations and leg swelling.  Gastrointestinal: Negative for abdominal distention, abdominal pain, constipation, diarrhea, nausea and vomiting.  Genitourinary: Negative for dysuria, flank pain, frequency and urgency.  Musculoskeletal: Positive for gait problem.       Left hip pain   Skin: Negative for color change, pallor and rash.       Sacral ulcer managed by wound Nurse   Neurological: Negative for dizziness, seizures, syncope, light-headedness and headaches.  Hematological: Does not bruise/bleed easily.  Psychiatric/Behavioral: Negative for agitation, confusion, hallucinations and sleep disturbance. The patient is not nervous/anxious.     Immunization History  Administered Date(s) Administered  . PPD Test 04/08/2016, 04/26/2016, 04/29/2016   Pertinent  Health Maintenance Due  Topic Date Due  . FOOT EXAM  11/30/1943  . OPHTHALMOLOGY EXAM  11/30/1943  . URINE MICROALBUMIN  11/30/1943  . PNA vac Low Risk Adult (1 of 2 - PCV13) 11/30/2000  . INFLUENZA VACCINE  10/20/2015  . HEMOGLOBIN A1C  10/27/2016   Fall Risk  06/15/2015 05/18/2015  Falls in the past year? Yes Yes  Number falls in past yr: - 2 or more  Injury with Fall? - Yes  Risk Factor Category  - High Fall Risk  Risk for fall due to : - History of fall(s)  Follow up - Education  provided;Falls prevention discussed    Vitals:   06/09/16 1015  BP: (!) 101/59  Pulse: 65  Resp: 18  Temp: 97.4 F (36.3 C)  SpO2: 98%  Weight: 113 lb (51.3 kg)  Height: 5\' 6"  (1.676 m)   Body mass index is 18.24 kg/m. Physical Exam  Constitutional:  Thin frail elderly in no acute distress  HENT:  Head: Normocephalic.  Mouth/Throat: Oropharynx is clear and moist. No oropharyngeal exudate.  Eyes: Conjunctivae and EOM are normal. Pupils are equal, round, and reactive to light. Right eye exhibits no discharge. Left eye exhibits no discharge. No scleral icterus.  Neck: Normal range of motion. No JVD present. No thyromegaly present.  Cardiovascular: Normal rate, regular rhythm, normal heart sounds and intact distal pulses.  Exam reveals no gallop and no friction rub.   No murmur heard. Pulmonary/Chest: Effort normal and breath sounds normal. No respiratory distress. He has no wheezes. He has no rales.  Abdominal: Soft. Bowel sounds are normal. He exhibits no distension. There is no tenderness. There is no rebound and no guarding.  Genitourinary:  Genitourinary Comments: Incontinent   Musculoskeletal: He exhibits no edema or tenderness.  Moves x 4 extremities.generalized weakness.   Lymphadenopathy:    He has no cervical adenopathy.  Neurological: He is alert.  Skin: Skin is warm and dry. No rash noted. No erythema. No pallor.  Sacral stage 2 ulcer; wound bed pink in color without any signs of infections.Surrounding skin tissue without any signs of infections.   Psychiatric: He has a normal mood and affect.    Labs reviewed:  Recent Labs  05/01/16 0710 05/02/16 0019 05/03/16 0533 05/09/16 05/12/16 05/26/16  NA 136 131* 140 137 140 145  K 3.8 3.9 4.2 3.1* 4.2 3.4  CL 107 98* 103  --   --   --   CO2 19* 21* 26  --   --   --   GLUCOSE 270* 534* 129*  --   --   --   BUN 45* 56* 58* 43* 42* 41*  CREATININE 2.15* 2.40* 2.25* 1.8* 2.0* 2.4*  CALCIUM 8.3* 7.9* 8.3*  --   --    --  Recent Labs  02/09/16 1250 04/02/16 1749  AST 22 19  ALT 14* 11*  ALKPHOS 70 79  BILITOT 0.9 0.3  PROT 6.2* 6.1*  ALBUMIN 3.2* 2.8*    Recent Labs  04/02/16 1749 04/03/16 1153  04/29/16 1034  05/01/16 0710 05/02/16 0024 05/03/16 0533  WBC 5.7 7.3  < > 5.2  < > 13.3* 10.4 9.3  NEUTROABS 4.1 5.4  --  3.8  --   --   --   --   HGB 9.9* 7.7*  < > 9.5*  < > 10.5* 9.0* 9.6*  HCT 29.0* 22.7*  < > 29.3*  < > 29.9* 27.2* 29.3*  MCV 97.0 97.7  < > 94.2  < > 90.3 92.2 92.4  PLT 227 194  < > 268  < > 279 267 274  < > = values in this interval not displayed.  Assessment/Plan  Physical deconditioning  Has work with PT/OT continues to require max assistance.With decreased oral intake and failure to thrive will consult Palliative care.    Poor appetite  Reports no appetite. Increase Remeron to 15 mg Tablet one by mouth at bedtime. Continue to monitor.Facility staff to feed with all meals.continue to follow up with Registered Dietician.     Family/ staff Communication: Reviewed plan of care with patient, facility Nurse and supervisor.   Labs/tests ordered: None

## 2016-06-13 LAB — BASIC METABOLIC PANEL
BUN: 70 mg/dL — AB (ref 4–21)
CREATININE: 3.7 mg/dL — AB (ref 0.6–1.3)
Glucose: 332 mg/dL
POTASSIUM: 4.9 mmol/L (ref 3.4–5.3)
Sodium: 150 mmol/L — AB (ref 137–147)

## 2016-06-13 LAB — CBC AND DIFFERENTIAL
HCT: 38 % — AB (ref 41–53)
Hemoglobin: 12.2 g/dL — AB (ref 13.5–17.5)
PLATELETS: 320 10*3/uL (ref 150–399)
WBC: 10.1 10^3/mL

## 2016-06-14 ENCOUNTER — Non-Acute Institutional Stay (SKILLED_NURSING_FACILITY): Payer: Medicare Other | Admitting: Family

## 2016-06-14 ENCOUNTER — Encounter: Payer: Self-pay | Admitting: Family

## 2016-06-14 DIAGNOSIS — E1100 Type 2 diabetes mellitus with hyperosmolarity without nonketotic hyperglycemic-hyperosmolar coma (NKHHC): Secondary | ICD-10-CM | POA: Diagnosis not present

## 2016-06-14 DIAGNOSIS — E870 Hyperosmolality and hypernatremia: Secondary | ICD-10-CM

## 2016-06-14 DIAGNOSIS — R531 Weakness: Secondary | ICD-10-CM

## 2016-06-14 DIAGNOSIS — R638 Other symptoms and signs concerning food and fluid intake: Secondary | ICD-10-CM

## 2016-06-14 DIAGNOSIS — E860 Dehydration: Secondary | ICD-10-CM

## 2016-06-14 NOTE — Progress Notes (Signed)
Location:  [facility] Room Number: [phone] Place of Service:  SNF (31) Provider: Dinah Ngetich FNP-C  BERT Nani Ravens, MD  Patient Care Team: Lynnea Ferrier, MD as PCP - General (Internal Medicine)  Extended Emergency Contact Information Primary Emergency Contact: [facility] Address: [address]          [place], [place] [address] [name] Home Phone: [phone] Relation: Spouse Secondary Emergency Contact: Jones Skene [place] Home Phone: [phone] Mobile Phone: [phone] Relation: Son  Code Status:  DNR  Goals of care: Advanced Directive information Advanced Directives 06/14/2016  Does Patient Have a Medical Advance Directive? Yes  Type of Advance Directive Out of facility DNR (pink MOST or yellow form)  Does patient want to make changes to medical advance directive? No - Patient declined  Copy of Healthcare Power of Attorney in Chart? -  Pre-existing out of facility DNR order (yellow form or pink MOST form) Yellow form placed in chart (order not valid for inpatient use)     Chief Complaint  Patient presents with  . Abnormal Lab    HPI:  Pt is a 83 y.o. male seen today at [facility] for an acute visit for evaluation of abnormal lab results.He has a significant medical history of Type 2 DM, COPD, Left DVT among other conditions. He is seen in his room today lying bed.Facility Nurse reports poor oral intake. No fever,chills or shortness of breath reported.Non-productive cough reported. Recent Portable CXR results negative for acute abnormalities but showed COPD. His recent lab results showed Hgb 12.2, HCT 38.1,Na 150, Glucose 332, BUN 70, CR 3.73 ( 06/13/2016).He was seen by PMR special 06/13/2016 Tramadol discontinued due to increased sedation.      Past Medical History:  Diagnosis Date  . Abnormal echocardiogram   . Asymptomatic bilateral carotid artery stenosis   .  Bilateral knee pain   . BPH (benign prostatic hyperplasia)   . Bruit of right carotid artery   . Chronic rhinitis   . COPD (chronic obstructive pulmonary disease) (HCC)   . Diabetic macular edema (HCC)   . DM (diabetes mellitus) (HCC)   . Dysplasia of prostate   . Edema   . Hyperlipidemia   . Hypertension   . NPDR (nonproliferative diabetic retinopathy) (HCC)   . Osteoarthritis   . Personal history of other malignant neoplasm of skin   . Primary osteoarthritis of both knees   . Pseudophakia of both eyes    Past Surgical History:  Procedure Laterality Date  . ANKLE SURGERY    . COLECTOMY    . COLON SURGERY    . HIP ARTHROPLASTY Left 02/10/2016   Procedure: ARTHROPLASTY BIPOLAR HIP (HEMIARTHROPLASTY);  Surgeon: Christena Flake, MD;  Location: [facility] ORS;  Service: Orthopedics;  Laterality: Left;    No Known Allergies  Allergies as of 06/14/2016   No Known Allergies     Medication List       Accurate as of 06/14/16 12:24 PM. Always use your most recent med list.          acetaminophen 500 MG tablet Commonly known as:  TYLENOL Take 500 mg by mouth every 6 (six) hours as needed for mild pain or fever.   amLODipine 10 MG tablet Commonly known as:  NORVASC Take 1 tablet (10 mg total) by mouth daily.   atorvastatin 20 MG tablet Commonly known as:  LIPITOR Take 20 mg by mouth daily.  budesonide-formoterol 160-4.5 MCG/ACT inhaler Commonly known as:  SYMBICORT Inhale 2 puffs into the lungs 2 (two) times daily.   CALCIUM CITRATE + D PO Take 1 tablet by mouth 2 (two) times daily.   citalopram 20 MG tablet Commonly known as:  CELEXA Take 20 mg by mouth daily.   cloNIDine 0.1 mg/24hr patch Commonly known as:  CATAPRES - Dosed in mg/24 hr Place 0.1 mg onto the skin once a week.   DECUBI-VITE PO Take 1 capsule by mouth daily.   divalproex 125 MG capsule Commonly known as:  DEPAKOTE SPRINKLE Take 125 mg by mouth 2 (two) times daily.   docusate sodium 100 MG  capsule Commonly known as:  COLACE Take 1 capsule (100 mg total) by mouth 2 (two) times daily.   donepezil 5 MG tablet Commonly known as:  ARICEPT Take 5 mg by mouth at bedtime.   enoxaparin 30 MG/0.3ML injection Commonly known as:  LOVENOX Inject 30 mg into the skin every 12 (twelve) hours.   feeding supplement (PRO-STAT SUGAR FREE 64) Liqd Take 30 mLs by mouth daily. Stop date 06/09/16   ferrous sulfate 325 (65 FE) MG tablet Take 1 tablet (325 mg total) by mouth 3 (three) times daily after meals.   fluticasone 50 MCG/ACT nasal spray Commonly known as:  FLONASE Place 2 sprays into both nostrils daily.   ICY HOT LIDOCAINE PLUS MENTHOL 4-1 % Ptch Generic drug:  Lidocaine-Menthol Apply 2 patches topically every morning. To left hip remove after 12 hours   insulin lispro 100 UNIT/ML injection Commonly known as:  HUMALOG Inject 0.02 mLs (2 Units total) into the skin 3 (three) times daily before meals.   ipratropium-albuterol 0.5-2.5 (3) MG/3ML Soln Commonly known as:  DUONEB Take 3 mLs by nebulization 2 (two) times daily.   loperamide 2 MG capsule Commonly known as:  IMODIUM Take 2 mg by mouth daily as needed for diarrhea or loose stools.   Melatonin 3 MG Tabs Take 1 tablet by mouth at bedtime.   metoprolol 50 MG tablet Commonly known as:  LOPRESSOR Take 1 tablet (50 mg total) by mouth 2 (two) times daily.   mirtazapine 15 MG tablet Commonly known as:  REMERON Take 15 mg by mouth at bedtime.   PROAIR HFA 108 (90 Base) MCG/ACT inhaler Generic drug:  albuterol Inhale 2 puffs into the lungs every 6 (six) hours as needed for wheezing or shortness of breath.   triamcinolone cream 0.1 % Commonly known as:  KENALOG Apply 1 application topically 2 (two) times daily.   Vitamin D3 2000 units capsule Take 1 capsule by mouth daily.       Review of Systems  Constitutional: Positive for appetite change. Negative for activity change, chills, fatigue and fever.  HENT:  Negative for congestion, rhinorrhea, sinus pain, sinus pressure, sneezing and sore throat.   Eyes: Negative.   Respiratory: Positive for cough. Negative for chest tightness, shortness of breath and wheezing.   Cardiovascular: Negative for chest pain, palpitations and leg swelling.  Gastrointestinal: Negative for abdominal distention, abdominal pain, constipation, diarrhea, nausea and vomiting.  Genitourinary: Negative for dysuria, flank pain, frequency and urgency.  Musculoskeletal: Positive for gait problem.       Left hip pain   Skin: Negative for color change, pallor and rash.       Sacral ulcer  Neurological: Negative for dizziness, seizures, syncope, light-headedness and headaches.  Psychiatric/Behavioral: Negative for agitation, confusion, hallucinations and sleep disturbance. The patient is not nervous/anxious.  Immunization History  Administered Date(s) Administered  . PPD Test 04/08/2016, 04/26/2016, 04/29/2016   Pertinent  Health Maintenance Due  Topic Date Due  . FOOT EXAM  11/30/1943  . OPHTHALMOLOGY EXAM  11/30/1943  . URINE MICROALBUMIN  11/30/1943  . PNA vac Low Risk Adult (1 of 2 - PCV13) 11/30/2000  . INFLUENZA VACCINE  10/20/2015  . HEMOGLOBIN A1C  10/27/2016   Fall Risk  06/15/2015 05/18/2015  Falls in the past year? Yes Yes  Number falls in past yr: - 2 or more  Injury with Fall? - Yes  Risk Factor Category  - High Fall Risk  Risk for fall due to : - History of fall(s)  Follow up - Education provided;Falls prevention discussed    Vitals:   06/14/16 1201  BP: 115/66  Pulse: 78  Resp: 18  Temp: 97.2 F (36.2 C)  TempSrc: Oral  SpO2: 97%  Weight: 113 lb (51.3 kg)  Height: 5\' 6"  (1.676 m)   Body mass index is 18.24 kg/m. Physical Exam  Constitutional:  Thin frail elderly grimaces due to left hip pain.   HENT:  Head: Normocephalic.  Mouth/Throat: Oropharynx is clear and moist. No oropharyngeal exudate.  Eyes: Conjunctivae and EOM are normal.  Pupils are equal, round, and reactive to light. Right eye exhibits no discharge. Left eye exhibits no discharge. No scleral icterus.  Neck: Normal range of motion. No JVD present. No thyromegaly present.  Cardiovascular: Normal rate, regular rhythm, normal heart sounds and intact distal pulses.  Exam reveals no gallop and no friction rub.   No murmur heard. Pulmonary/Chest: Effort normal and breath sounds normal. No respiratory distress. He has no wheezes. He has no rales.  Abdominal: Soft. Bowel sounds are normal. He exhibits no distension. There is no tenderness. There is no rebound and no guarding.  Genitourinary:  Genitourinary Comments: Incontinent   Musculoskeletal: He exhibits no edema or tenderness.  Moves x 4 extremities.generalized weakness.   Lymphadenopathy:    He has no cervical adenopathy.  Neurological: He is alert.  Skin: Skin is warm and dry. No rash noted. No erythema. No pallor.  Sacral stage 2 ulcer;wound bed pink in color without any signs of infections.no worsening signs since prior visit.Surrounding skin tissue without any signs of infections.   Psychiatric: He has a normal mood and affect.    Labs reviewed:  Recent Labs  05/01/16 0710 05/02/16 0019 05/03/16 0533  05/12/16 05/26/16 06/08/16 06/13/16  NA 136 131* 140  < > 140 145 144 150*  K 3.8 3.9 4.2  < > 4.2 3.4  --  4.9  CL 107 98* 103  --   --   --   --   --   CO2 19* 21* 26  --   --   --   --   --   GLUCOSE 270* 534* 129*  --   --   --   --   --   BUN 45* 56* 58*  < > 42* 41* 71* 70*  CREATININE 2.15* 2.40* 2.25*  < > 2.0* 2.4*  --  3.7*  CALCIUM 8.3* 7.9* 8.3*  --   --   --   --   --   < > = values in this interval not displayed.  Recent Labs  02/09/16 1250 04/02/16 1749  AST 22 19  ALT 14* 11*  ALKPHOS 70 79  BILITOT 0.9 0.3  PROT 6.2* 6.1*  ALBUMIN 3.2* 2.8*    Recent Labs  04/02/16 1749 04/03/16 1153  04/29/16 1034  05/01/16 0710 05/02/16 0024 05/03/16 0533 06/13/16  WBC 5.7 7.3   < > 5.2  < > 13.3* 10.4 9.3 10.1  NEUTROABS 4.1 5.4  --  3.8  --   --   --   --   --   HGB 9.9* 7.7*  < > 9.5*  < > 10.5* 9.0* 9.6* 12.2*  HCT 29.0* 22.7*  < > 29.3*  < > 29.9* 27.2* 29.3* 38*  MCV 97.0 97.7  < > 94.2  < > 90.3 92.2 92.4  --   PLT 227 194  < > 268  < > 279 267 274 320  < > = values in this interval not displayed. No results found for: TSH Lab Results  Component Value Date   HGBA1C 7.5 (H) 04/29/2016   Assessment/Plan 1. Generalized weakness Progressive decline in condition. Palliative care consulted. Will wean off Remeron could be contributing to weakness and sedation. Decreased Remeron to 7.5 mg Tablet at bedtime X one week then discontinue.   2. Dehydration with hypernatremia Na 150, Glucose 332, BUN 70, CR 3.73 ( 06/13/2016).Possible due to poor oral intake. Will start IVF D5W @ 70cc/HR X 11 Liter bolus. Obtain BMP 06/15/2016 and 06/16/2016   3. Hypernatremia Na 150 ( 06/13/2016).Possible due to dehydration. IVF bolus as above. Recheck BMP  06/15/2016 and 06/16/2016.    4. Decreased oral intake Will start IVF as above. Facility staff continue to assist with all meals.  5. Type 2 DM  CBG ranging in the 200's-300's. Continue Humalog per SSI.start Levemir 100 units/ml inject 5 units SQ at bedtime. Continue to check CBG's ACHS.    Family/ staff Communication: Reviewed plan of care with patient and facility Nurse supervisor  Labs/tests ordered: BMP 06/15/2016 and 06/16/2016   Caesar Bookmaninah C Ngetich, NP

## 2016-06-15 ENCOUNTER — Non-Acute Institutional Stay (SKILLED_NURSING_FACILITY): Payer: Medicare Other | Admitting: Family

## 2016-06-15 ENCOUNTER — Encounter: Payer: Self-pay | Admitting: Family

## 2016-06-15 DIAGNOSIS — S72002S Fracture of unspecified part of neck of left femur, sequela: Secondary | ICD-10-CM

## 2016-06-15 DIAGNOSIS — J449 Chronic obstructive pulmonary disease, unspecified: Secondary | ICD-10-CM | POA: Diagnosis not present

## 2016-06-15 DIAGNOSIS — E43 Unspecified severe protein-calorie malnutrition: Secondary | ICD-10-CM

## 2016-06-15 DIAGNOSIS — R2681 Unsteadiness on feet: Secondary | ICD-10-CM | POA: Diagnosis not present

## 2016-06-15 DIAGNOSIS — E1100 Type 2 diabetes mellitus with hyperosmolarity without nonketotic hyperglycemic-hyperosmolar coma (NKHHC): Secondary | ICD-10-CM

## 2016-06-15 NOTE — Progress Notes (Signed)
Location:  [facility] Room Number: 505-B Place of Service:  SNF (31)  Provider: Richarda Blade FNP-C   PCP: Lynnea Ferrier, MD Patient Care Team: Lynnea Ferrier, MD as PCP - General (Internal Medicine)  Extended Emergency Contact Information Primary Emergency Contact: [name] Address: [address]          [place], [place] [address] [name] of [place] Home Phone: [phone] Relation: Spouse Secondary Emergency Contact: Jones Skene [place] of [place] Home Phone: [phone] Mobile Phone: [phone] Relation: Son  Code Status: DNR   Goals of care:  Advanced Directive information Advanced Directives 06/14/2016  Does Patient Have a Medical Advance Directive? Yes  Type of Advance Directive Out of facility DNR (pink MOST or yellow form)  Does patient want to make changes to medical advance directive? No - Patient declined  Copy of Healthcare Power of Attorney in Chart? -  Pre-existing out of facility DNR order (yellow form or pink MOST form) Yellow form placed in chart (order not valid for inpatient use)     No Known Allergies  Chief Complaint  Patient presents with  . Discharge Note    Discharge from [facility]     HPI:  83 y.o. male seen today at [facility] for discharge to [facility].He was here for short term rehabilitation for post hospital admission from 04/29/2016-05/04/2016 for shortness of breath and cough.He was treated for COPD with acute bronchitis.He was treated with Prednisone and Antibiotics. He also had Hyperglycemia due to Prednisone.Of note he was here in Rehab post hospital discharge from [facility] on 04/08/2006 for left traumatic hip injury.He has a medical history of HTN, Hyperlipidemia, Type 2 DM,COPD,OA,BPH, vascular Dementia, DVT among other conditions.He is seen in his room today with wife and daughter in law at  bedside.His recent lab results Na 150, BUN 70, CR 3.73 ( 06/13/2016). He received D5W x 1 Liter due to poor oral intake. Repeat lab results drawn this morning pending. His oxygen saturation on room air was 88 % oxygen 2 liters via [place] applied with much improved.Duoneb treatment also given. Oxygen rechecked was 96-97 %. 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 He denies acute issues.Patient's family request transfer to [facility] SNF closer to home and family.   He has worked with PT/OT but has had progressive physical deconditioning. He has had decreased oral intake. Remeron was started but will wean off due to sedation. He was also seen by PMR provider and his tramadol was  discontinued due to sedation.He is currently on scheduled Tylenol for pain.Palliative care recommended and patient's wife agrees but patient's son would like to wait until patient is transferred to [facility]. He will be discharged to SNF to continue with PT/OT to continue with ROM, Exercise, Gait stability and muscle strengthening. He does not require any DME. Transfer arrangement will be arranged by facility social worker prior to discharge. Prescription medication will be written x 1 month then patient to follow up with PCP in 1-2 weeks.Facility staff report no new concerns.   Past Medical History:  Diagnosis Date  . Abnormal echocardiogram   . Asymptomatic bilateral carotid artery stenosis   . Bilateral  knee pain   . BPH (benign prostatic hyperplasia)   . Bruit of right carotid artery   . Chronic rhinitis   . COPD (chronic obstructive pulmonary disease) (HCC)   . Diabetic macular edema (HCC)   . DM (diabetes mellitus) (HCC)   . Dysplasia of prostate   . Edema   . Hyperlipidemia     . Hypertension   . NPDR (nonproliferative diabetic retinopathy) (HCC)   . Osteoarthritis   . Personal history of other malignant neoplasm of skin   . Primary osteoarthritis of both knees   . Pseudophakia of both eyes     Past Surgical History:  Procedure Laterality Date  . ANKLE SURGERY    . COLECTOMY    . COLON SURGERY    . HIP ARTHROPLASTY Left 02/10/2016   Procedure: ARTHROPLASTY BIPOLAR HIP (HEMIARTHROPLASTY);  Surgeon: Christena Flake, MD;  Location: [facility] ORS;  Service: Orthopedics;  Laterality: Left;      reports that he quit smoking about 12 years ago. His smoking use included Cigarettes. He has a 60.00 pack-year smoking history. He has never used smokeless tobacco. He reports that he does not drink alcohol or use drugs. Social History   Social History  . Marital status: Married    Spouse name: N/A  . Number of children: N/A  . Years of education: N/A   Occupational History  . Not on file.   Social History Main Topics  . Smoking status: Former Smoker    Packs/day: 1.00    Years: 60.00    Types: Cigarettes    Quit date: 11/24/2003  . Smokeless tobacco: Never Used  . Alcohol use No     Comment: occasional beer  . Drug use: No  . Sexual activity: Not on file   Other Topics Concern  . Not on file   Social History Narrative  . No narrative on file   Functional Status Survey:    No Known Allergies  Pertinent  Health Maintenance Due  Topic Date Due  . FOOT EXAM  11/30/1943  . OPHTHALMOLOGY EXAM  11/30/1943  . URINE MICROALBUMIN  [birth year]/30/1943  . PNA vac Low Risk Adult (1 of 2 - PCV13) 11/30/2000  . INFLUENZA VACCINE  10/20/2015  . HEMOGLOBIN A1C  10/27/2016    Medications: Allergies as of 06/15/2016   No Known Allergies     Medication List       Accurate as of 06/15/16  1:12 PM. Always use your most recent med list.          acetaminophen 500 MG tablet Commonly known as:  TYLENOL Take 500 mg by mouth every 6 (six) hours as needed for mild pain or  fever.   amLODipine 10 MG tablet Commonly known as:  NORVASC Take 1 tablet (10 mg total) by mouth daily.   atorvastatin 20 MG tablet Commonly known as:  LIPITOR Take 20 mg by mouth daily.   budesonide-formoterol 160-4.5 MCG/ACT inhaler Commonly known as:  SYMBICORT Inhale 2 puffs into the lungs 2 (two) times daily.   CALCIUM CITRATE + D PO Take 1 tablet by mouth 2 (two) times daily.   citalopram 20 MG tablet Commonly known as:  CELEXA Take 20 mg by mouth daily.   cloNIDine 0.1 mg/24hr patch Commonly known as:  CATAPRES - Dosed in mg/24 hr Place 0.1 mg onto the skin once a week.   DECUBI-VITE PO Take 1 capsule by mouth daily.   divalproex 125 MG capsule Commonly known as:  DEPAKOTE  SPRINKLE Take 125 mg by mouth 2 (two) times daily.   docusate sodium 100 MG capsule Commonly known as:  COLACE Take 1 capsule (100 mg total) by mouth 2 (two) times daily.   donepezil 5 MG tablet Commonly known as:  ARICEPT Take 5 mg by mouth at bedtime.   enoxaparin 30 MG/0.3ML injection Commonly known as:  LOVENOX Inject 30 mg into the skin every 12 (twelve) hours.   feeding supplement (PRO-STAT SUGAR FREE 64) Liqd Take 30 mLs by mouth daily. Stop date 06/09/16   ferrous sulfate 325 (65 FE) MG tablet Take 1 tablet (325 mg total) by mouth 3 (three) times daily after meals.   fluticasone 50 MCG/ACT nasal spray Commonly known as:  FLONASE Place 2 sprays into both nostrils daily.   ICY HOT LIDOCAINE PLUS MENTHOL 4-1 % Ptch Generic drug:  Lidocaine-Menthol Apply 2 patches topically every morning. To left hip remove after 12 hours   insulin lispro 100 UNIT/ML injection Commonly known as:  HUMALOG Inject 0.02 mLs (2 Units total) into the skin 3 (three) times daily before meals.   ipratropium-albuterol 0.5-2.5 (3) MG/3ML Soln Commonly known as:  DUONEB Take 3 mLs by nebulization 2 (two) times daily.   LEVEMIR 100 UNIT/ML injection Generic drug:  insulin detemir Inject 5 Units into  the skin at bedtime.   loperamide 2 MG capsule Commonly known as:  IMODIUM Take 2 mg by mouth daily as needed for diarrhea or loose stools.   Melatonin 3 MG Tabs Take 1 tablet by mouth at bedtime.   metoprolol 50 MG tablet Commonly known as:  LOPRESSOR Take 1 tablet (50 mg total) by mouth 2 (two) times daily.   mirtazapine 15 MG tablet Commonly known as:  REMERON Take 15 mg by mouth at bedtime.   PROAIR HFA 108 (90 Base) MCG/ACT inhaler Generic drug:  albuterol Inhale 2 puffs into the lungs every 6 (six) hours as needed for wheezing or shortness of breath.   triamcinolone cream 0.1 % Commonly known as:  KENALOG Apply 1 application topically 2 (two) times daily.   Vitamin D3 2000 units capsule Take 1 capsule by mouth daily.       Review of Systems  Constitutional: Negative for activity change, chills, fatigue and fever.  HENT: Negative for congestion, rhinorrhea, sinus pain, sinus pressure, sneezing and sore throat.   Eyes: Negative.   Respiratory: Positive for cough. Negative for chest tightness, shortness of breath and wheezing.   Cardiovascular: Negative for chest pain, palpitations and leg swelling.  Gastrointestinal: Negative for abdominal distention, abdominal pain, constipation, diarrhea, nausea and vomiting.  Endocrine: Negative for cold intolerance, heat intolerance, polydipsia, polyphagia and polyuria.  Genitourinary: Negative for dysuria, flank pain, frequency and urgency.  Musculoskeletal: Positive for gait problem.       Left hip pain   Skin: Negative for color change, pallor and rash.       Sacral ulcer managed by wound care Nurse   Neurological: Negative for dizziness, seizures, syncope, light-headedness and headaches.  Hematological: Does not bruise/bleed easily.  Psychiatric/Behavioral: Negative for agitation, confusion, hallucinations and sleep disturbance. The patient is not nervous/anxious.     Vitals:   06/15/16 0936  BP: 115/66  Pulse: 78    Resp: 20  Temp: 97.2 F (36.2 C)  TempSrc: Oral  SpO2: 93%  Weight: 113 lb (51.3 kg)  Height: 5\' 6"  (1.676 m)   Body mass index is 18.24 kg/m. Physical Exam  Constitutional:  Thin frail elderly in no acute  distress    HENT:  Head: Normocephalic.  Mouth/Throat: Oropharynx is clear and moist. No oropharyngeal exudate.  Eyes: Conjunctivae and EOM are normal. Pupils are equal, round, and reactive to light. Right eye exhibits no discharge. Left eye exhibits no discharge. No scleral icterus.  Neck: Normal range of motion. No JVD present. No thyromegaly present.  Cardiovascular: Normal rate, regular rhythm, normal heart sounds and intact distal pulses.  Exam reveals no gallop and no friction rub.   No murmur heard. Pulmonary/Chest: Effort normal and breath sounds normal. No respiratory distress. He has no wheezes. He has no rales.  Oxygen 2 Liters [place]   Abdominal: Soft. Bowel sounds are normal. He exhibits no distension. There is no tenderness. There is no rebound and no guarding.  LBM 06/15/2016  Genitourinary:  Genitourinary Comments: Incontinent   Musculoskeletal: He exhibits no edema or tenderness.  Moves x 4 extremities.generalized weakness.   Lymphadenopathy:    He has no cervical adenopathy.  Neurological: He is alert.  Skin: Skin is warm and dry. No rash noted. No erythema. No pallor.  Sacral stage 2 ulcer X 2 wound bed pink in color without any signs of infections.Surrounding skin tissue without any signs of infections.   Psychiatric: He has a normal mood and affect.    Labs reviewed: Basic Metabolic Panel:  Recent Labs  16/12/9600/11/18 0710 05/02/16 0019 05/03/16 0533  05/12/16 05/26/16 06/08/16 06/13/16  NA 136 131* 140  < > 140 145 144 150*  K 3.8 3.9 4.2  < > 4.2 3.4  --  4.9  CL 107 98* 103  --   --   --   --   --   CO2 19* 21* 26  --   --   --   --   --   GLUCOSE 270* 534* 129*  --   --   --   --   --   BUN 45* 56* 58*  < > 42* 41* 71* 70*  CREATININE 2.15* 2.40*  2.25*  < > 2.0* 2.4*  --  3.7*  CALCIUM 8.3* 7.9* 8.3*  --   --   --   --   --   < > = values in this interval not displayed. Liver Function Tests:  Recent Labs  02/09/16 1250 04/02/16 1749  AST 22 19  ALT 14* 11*  ALKPHOS 70 79  BILITOT 0.9 0.3  PROT 6.2* 6.1*  ALBUMIN 3.2* 2.8*   CBC:  Recent Labs  04/02/16 1749 04/03/16 1153  04/29/16 1034  05/01/16 0710 05/02/16 0024 05/03/16 0533 06/13/16  WBC 5.7 7.3  < > 5.2  < > 13.3* 10.4 9.3 10.1  NEUTROABS 4.1 5.4  --  3.8  --   --   --   --   --   HGB 9.9* 7.7*  < > 9.5*  < > 10.5* 9.0* 9.6* 12.2*  HCT 29.0* 22.7*  < > 29.3*  < > 29.9* 27.2* 29.3* 38*  MCV 97.0 97.7  < > 94.2  < > 90.3 92.2 92.4  --   PLT 227 194  < > 268  < > 279 267 274 320  < > = values in this interval not displayed. Cardiac Enzymes:  Recent Labs  04/02/16 1749 04/29/16 2120 04/30/16 0341 04/30/16 1025  CKTOTAL 102  --   --   --   TROPONINI <0.03 <0.03 <0.03 <0.03    Recent Labs  05/03/16 2155 05/04/16 0737 05/04/16 1133  GLUCAP 137* 228*  150*    Assessment/Plan:   1. Unsteady gait  Has worked with PT/ OT but has had progressive generalized weakness.He requires max assistance. Will discharge to [facility] SNF per family request to continue with PT/OT with ROM, Exercise, Gait stability and muscle strengthening. No DME required. Fall and safety precautions.   2. COPD without exacerbation Has required oxygen 2 Liters via [place]. Recent CXR negative for acute abnormalities. Continue on DuoNeb,symbicort and Albuterol. CBC in 1-2 weeks with PCP.    3. Type 2 diabetes mellitus  CBG ranging in 150's-200's. Had hypoglycemia episodes with his poor oral intake. Levemir was discontinued but restarted due to CBG's ranging in the 200's-300's. Continue Levemir 5 units SQ at bedtime and Humalog 2 units SQ three times with meals for CBG's > 150. Recommended increasing levemir or Humalog if tolerating oral intake well.   4. Protein-calorie malnutrition,  severe Has improved with protein supplements. CMP in 1-2 weeks with PCP.   5. Closed left hip fracture, sequela Status post short term rehabilitation post hospital admission for hip injury. S/p left hip arthroplasty with prosthetic dislocation.Tramadol recently discontinued due to sedation. Continue on Tylenol 500 mg Tablet three times daily. Re-evaluate pain then consider low dose tramadol if needed. Currently on Levonox injection  for left Common femoral thrombus started 05/01/2016.   Patient is being discharged with the following home health services:    - Discharged to [facility].    Patient is being discharged with the following durable medical equipment:   - None required.   Patient has been advised to f/u with their PCP in 1-2 weeks to for a transitions of care visit.Social services at their facility was responsible for arranging this appointment.  Pt was provided with adequate prescriptions of noncontrolled medications to reach the scheduled appointment.For controlled substances, a limited supply was provided as appropriate for the individual patient. If the pt normally receives these medications from a pain clinic or has a contract with another physician, these medications should be received from that clinic or physician only).    Future labs/tests needed:  CBC, CMP in 1-2 weeks PCP

## 2016-06-16 DIAGNOSIS — I82402 Acute embolism and thrombosis of unspecified deep veins of left lower extremity: Secondary | ICD-10-CM

## 2016-06-16 DIAGNOSIS — J9611 Chronic respiratory failure with hypoxia: Secondary | ICD-10-CM

## 2016-06-16 DIAGNOSIS — N184 Chronic kidney disease, stage 4 (severe): Secondary | ICD-10-CM

## 2016-06-16 DIAGNOSIS — J439 Emphysema, unspecified: Secondary | ICD-10-CM

## 2016-06-16 DIAGNOSIS — G301 Alzheimer's disease with late onset: Secondary | ICD-10-CM | POA: Diagnosis not present

## 2016-06-16 DIAGNOSIS — E1121 Type 2 diabetes mellitus with diabetic nephropathy: Secondary | ICD-10-CM

## 2016-06-16 DIAGNOSIS — F39 Unspecified mood [affective] disorder: Secondary | ICD-10-CM

## 2016-06-16 DIAGNOSIS — N179 Acute kidney failure, unspecified: Secondary | ICD-10-CM

## 2016-06-16 DIAGNOSIS — I6529 Occlusion and stenosis of unspecified carotid artery: Secondary | ICD-10-CM

## 2016-06-20 NOTE — Progress Notes (Signed)
This encounter was created in error - please disregard.

## 2016-06-27 ENCOUNTER — Telehealth: Payer: Self-pay | Admitting: Family Medicine

## 2016-06-27 ENCOUNTER — Telehealth: Payer: Self-pay

## 2016-06-27 DIAGNOSIS — R739 Hyperglycemia, unspecified: Secondary | ICD-10-CM | POA: Diagnosis not present

## 2016-06-27 NOTE — Telephone Encounter (Signed)
PLEASE NOTE: All timestamps contained within this report are represented as [place] Standard Time. CONFIDENTIALTY NOTICE: This fax transmission is intended only for the addressee. It contains information that is legally privileged, confidential or otherwise protected from use or disclosure. If you are not the intended recipient, you are strictly prohibited from reviewing, disclosing, copying using or disseminating any of this information or taking any action in reliance on or regarding this information. If you have received this fax in error, please notify us immediately by telephone so that we can arrange for its return to [place]. Phone: [phone], Toll-Free: [phone], Fax: [phone] Page: 1 of 1 Call Id: [id number] [facility] Night - Client Nonclinical Telephone Record [facility] Call Center Client [facility] Night - Client Client Site [facility] [place] - Night Physician Tillman Abide - MD Contact Type Call Who Is Calling Physician / Provider / Hospital Call Type Provider Call [facility] Page Now Reason for Call Request to speak to Physician Initial Comment Additional Comment Caller states pt's blood sugar 539 Patient Name Patrick Morrison Patient DOB [birth year] Requesting Provider Sherol Dade, RN Physician Number [phone] Facility Name [facility] Phone DateTime Result/Outcome Message Type Notes Tana Conch - MD [phone] 06/26/2016 11:51:10 AM Called On Call Provider - Reached Doctor Paged Tana Conch - MD 06/26/2016 11:51:17 AM Spoke with On Call - General Message Result Call Closed By: Rudi Rummage Transaction Date/Time: 06/26/2016 11:34:29 AM (ET)

## 2016-06-27 NOTE — Telephone Encounter (Signed)
PLEASE NOTE: All timestamps contained within this report are represented as [place] Standard Time. CONFIDENTIALTY NOTICE: This fax transmission is intended only for the addressee. It contains information that is legally privileged, confidential or otherwise protected from use or disclosure. If you are not the intended recipient, you are strictly prohibited from reviewing, disclosing, copying using or disseminating any of this information or taking any action in reliance on or regarding this information. If you have received this fax in error, please notify us immediately by telephone so that we can arrange for its return to [place]. Phone: [phone], Toll-Free: [phone], Fax: [phone] Page: 1 of 1 Call Id: [id number] [facility] Night - Client TELEPHONE ADVICE RECORD [facility] Medical Call Center Patient Name: Patrick Morrison Gender: Male DOB: July 09, 1933 Age: 83 Y 6 M 26 D Return Phone Number: [phone] (Primary), [phone] (Secondary) City/State/Zip:  Client [facility] Night - Client Client Site [facility] [place] - Night Physician Tillman Abide - MD Who Is Calling Patient / Member / Family / Caregiver Call Type Triage / Clinical Caller Name Judeth Cornfield Relationship To Patient Care Giver Return Phone Number [phone] (Primary) Chief Complaint Puncture Wound / Stab Wound Reason for Call Symptomatic / Request for Health Information Initial Comment Loura Halt w/[facility] [phone]) states she suffered a needle stick and is requesting stat blood drawr orders. Nurse Assessment Nurse: Katrina Stack RN, Dahlia Client Date/Time (Eastern Time): 06/25/2016 4:07:59 PM Confirm and document reason for call. If symptomatic, describe symptoms. ---Caller states she is a nurse at the SNF the patient is at. The nurse stuck herself after she gave the patient a shot of Insulin. The nurse stated she has order to draw labs for  HIV and Hep B on patient but wants a verbal order to run the labs stat since it is the weekend. Will call on call. Please document clinical information provided and list any resource used. ---Spoke with on call and he gave verbal order for labs on patient to be run stat. Informed the nurse the labs can be run stat. Guidelines Guideline Title Affirmed Question Disp. Time Lamount Cohen Time) Disposition Final User 06/25/2016 4:15:50 PM Clinical Call Yes Katrina Stack, RN, [facility] DoctorName Phone DateTime Action Result/Outcome Notes Tana Conch - MD [phone] 06/25/2016 4:12:50 PM Doctor Paged Jeanene Erb On Call Provider - Carylon Perches - MD 06/25/2016 4:13:35 PM Message Result Spoke with On Call - General spoke with Dr. Durene Cal and gave him report. He gave verbal order for stat labs for HIV and Hep B. Will inform the nurse.

## 2016-06-27 NOTE — Telephone Encounter (Signed)
Noted That would be routine and appropriate

## 2016-06-27 NOTE — Telephone Encounter (Signed)
[facility] weekend call  Patient at [facility]. Called Sunday 06/26/16 afternoon with sugar near 550. Prior sliding scale was up to 8 with CBG up to 400, call MD above that. Had been discontinued recently with levemir 10 units daily still on board per nursing.   I instructed 12 units humalog with 4 hour repeat check and resume prior sliding scale that recently had been d/ced q4 hours while awake and to call Dr. Alphonsus Sias tomorrow AM for further instructions.   Nursing wanted to consider transfer to hospital at time of initial call- agreed if he remained above 400 with above instructions could proceed to ED

## 2016-06-27 NOTE — Telephone Encounter (Signed)
Nothing about what this message was. Will check at [facility] this morning

## 2016-07-04 ENCOUNTER — Telehealth: Payer: Self-pay

## 2016-07-04 DIAGNOSIS — G031 Chronic meningitis: Secondary | ICD-10-CM | POA: Diagnosis not present

## 2016-07-04 NOTE — Telephone Encounter (Signed)
PLEASE NOTE: All timestamps contained within this report are represented as [place] Standard Time. CONFIDENTIALTY NOTICE: This fax transmission is intended only for the addressee. It contains information that is legally privileged, confidential or otherwise protected from use or disclosure. If you are not the intended recipient, you are strictly prohibited from reviewing, disclosing, copying using or disseminating any of this information or taking any action in reliance on or regarding this information. If you have received this fax in error, please notify us immediately by telephone so that we can arrange for its return to [place]. Phone: [phone], Toll-Free: [phone], Fax: [phone] Page: 1 of 1 Call Id: [id number] [place] [facility] Night - Client Nonclinical Telephone Record [facility] Medical Call Center Client [place] Night - Client Client Site [facility] [place] [facility] - Night Physician Tillman Abide - MD Contact Type Call Who Is Calling Physician / Provider / Hospital Call Type Provider Call [facility] Page Now Reason for Call Request for Orders Initial Comment Brennan Bailey [phone]) Hospice patient with status change, requesting orders. Additional Comment Patient Name Patrick Morrison Patient DOB February 27, 1934 Requesting Provider Morrie Sheldon Physician Number [phone] Facility Name [facility]. Paging DoctorName Phone DateTime Result/Outcome Message Type Notes Lezlie Octave - MD [phone] 07/02/2016 10:20:47 PM Paged On Call Back to Call Center Doctor Paged Call [facility] [phone] Re: Hospice patient w/status change, requesting orders. Lezlie Octave - MD 07/02/2016 10:22:26 PM Spoke with On Call - General Message Result Tranferred Dr. Beverely Low to [place]. Call Closed By: Richardean Chimera Transaction Date/Time: 07/02/2016 10:12:59 PM (ET)

## 2016-07-04 NOTE — Telephone Encounter (Signed)
Seen today with wife and son Actively dying Hospice involved

## 2016-07-19 DEATH — deceased

## 2016-12-28 IMAGING — CR DG PELVIS 1-2V
1 series · 2 of 2 positions shown · non-contrast
Comparison: None.

CLINICAL DATA: Low back and bilateral lower extremity pain.

EXAM:
PELVIS - 1-2 VIEW

[Series 1: dg pelvis 1-2 views · 0.14mm/px · 2 of 2 slices shown]
[im 1/2]
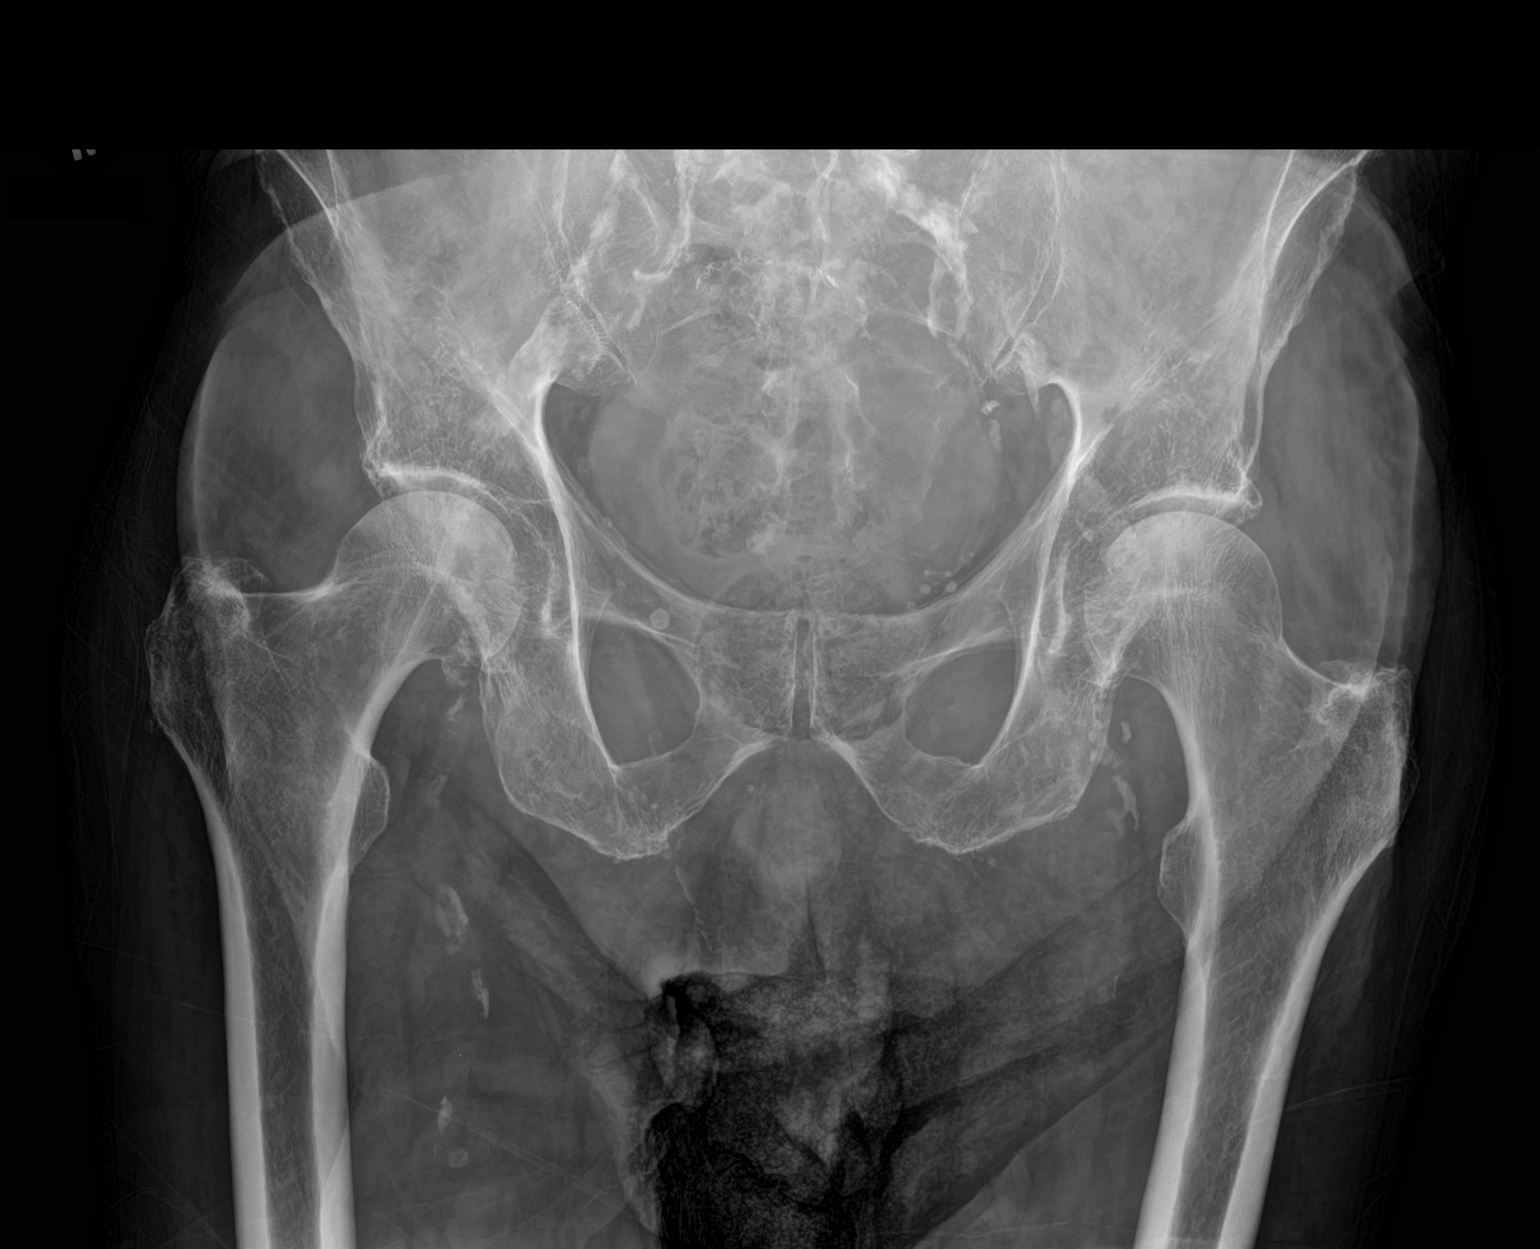
[im 2/2]
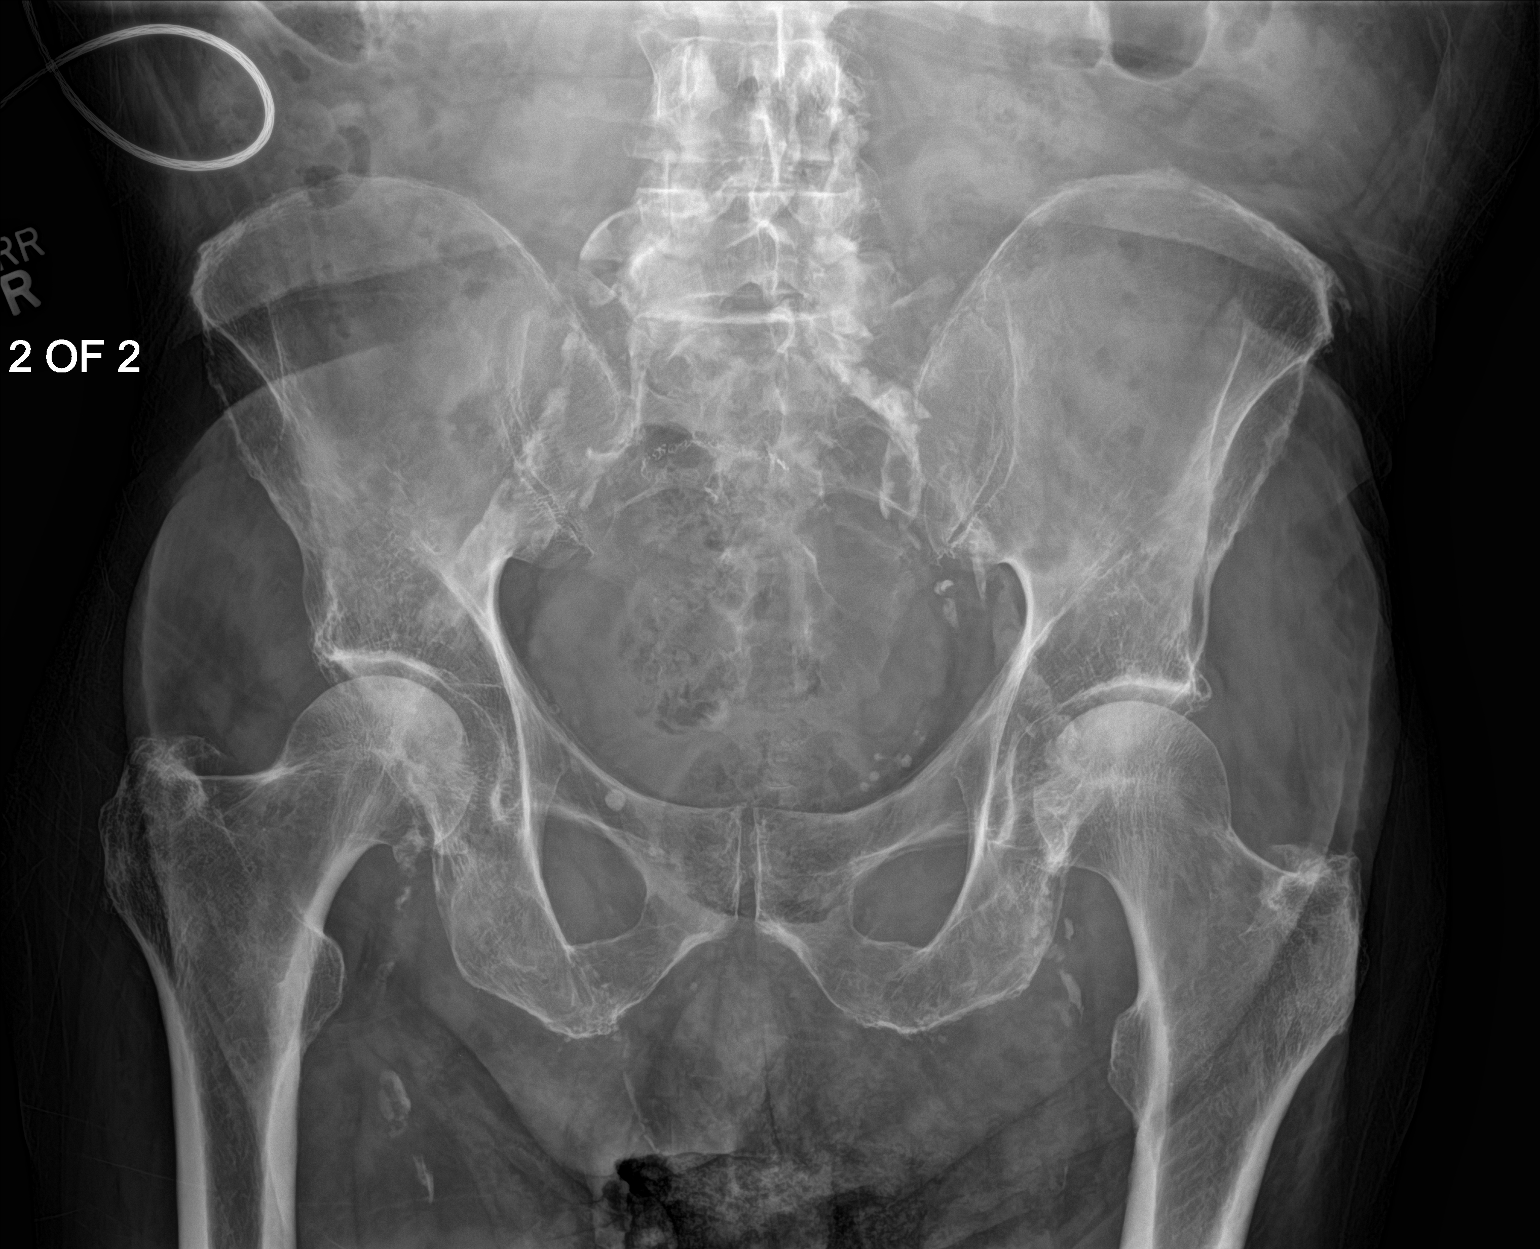

[2 of 2 positions shown; findings below may reference images not displayed]

FINDINGS: There is no evidence of pelvic fracture or diastasis. No pelvic bone
lesions are seen. Vascular calcifications are noted.
IMPRESSION: No bony abnormality is noted in the pelvis.

## 2016-12-28 IMAGING — CT CT HEAD W/O CM
3 series · 16 of 30 positions shown, 17 images · non-contrast
Comparison: None.

CLINICAL DATA: Initial evaluation for frequent falls.

EXAM:
CT HEAD WITHOUT CONTRAST
TECHNIQUE: Contiguous axial images were obtained from the base of the skull
through the vertex without intravenous contrast.

[Series 2: head bone · axial · 0.43mm/px · z∈[-198,-58]mm · 8 of 85 slices shown]
[im 10/85  bone]
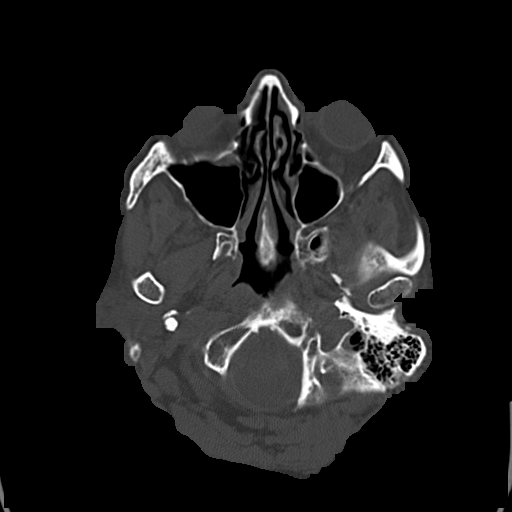
[im 20/85  bone]
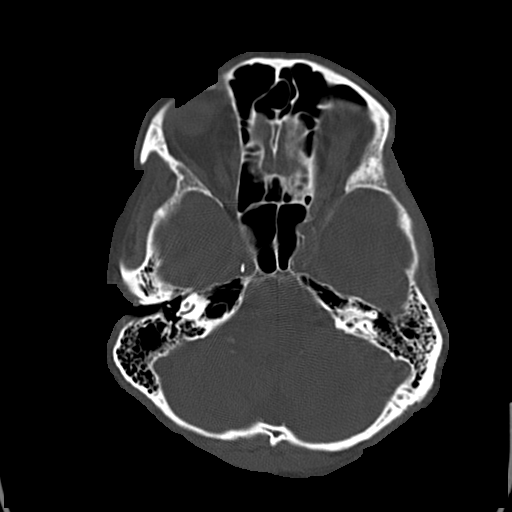
[im 30/85  bone]
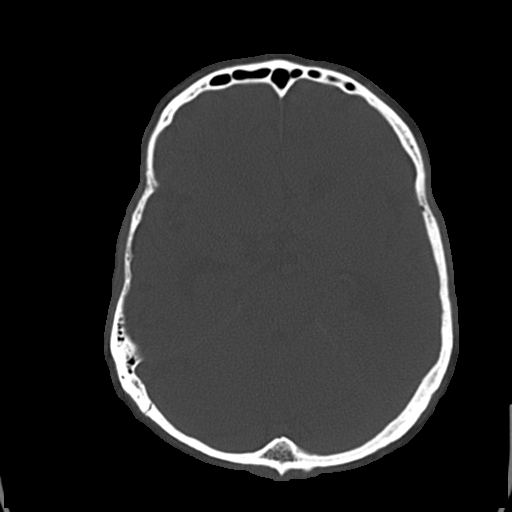
[im 40/85  bone]
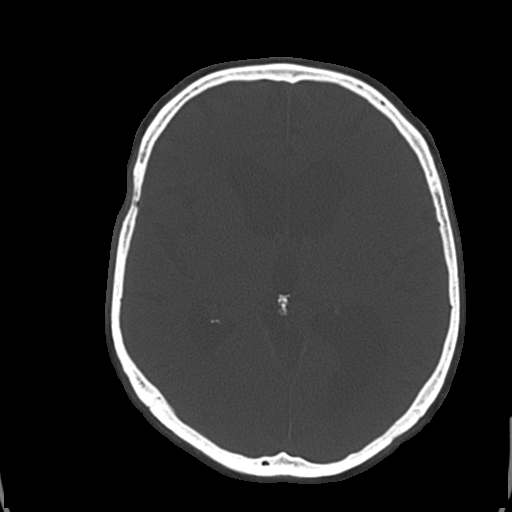
[im 50/85  bone]
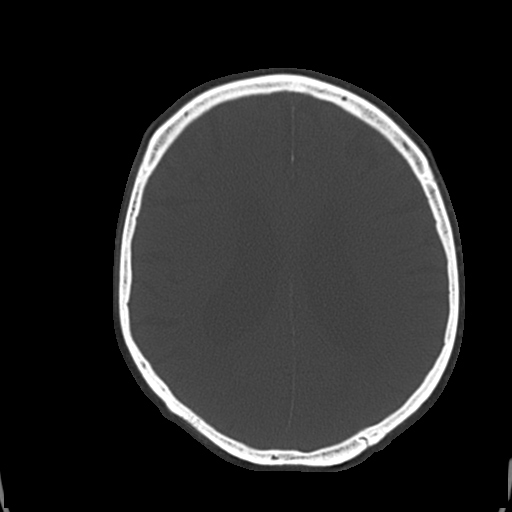
[im 60/85  bone]
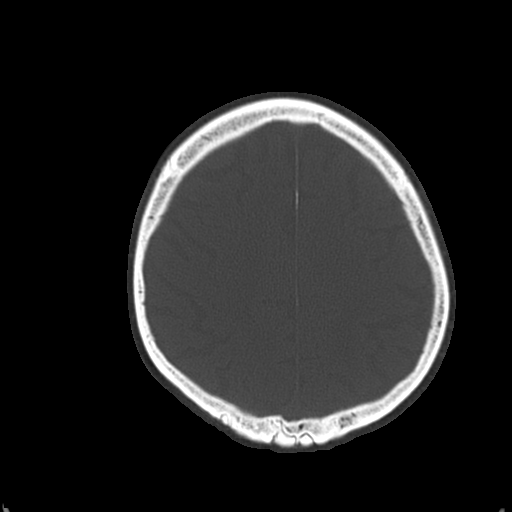
[im 70/85  bone]
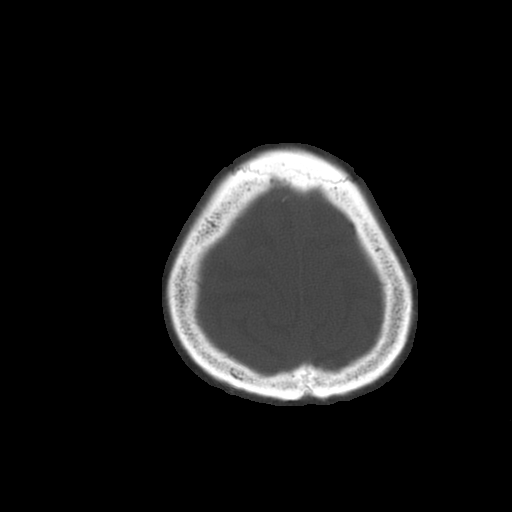
[im 80/85  bone]
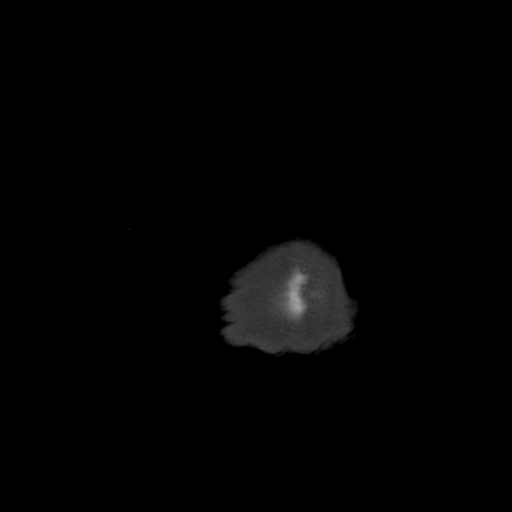

[Series 3: head wo · axial · 0.43mm/px · z∈[-177,-87]mm · 4 of 31 slices shown]
[im 7/31  brain]
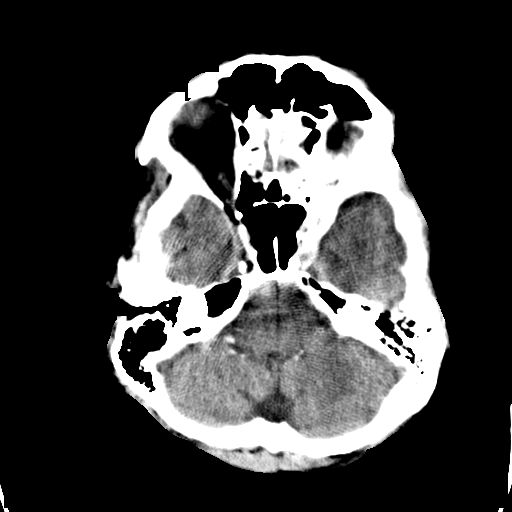
[im 13/31  brain]
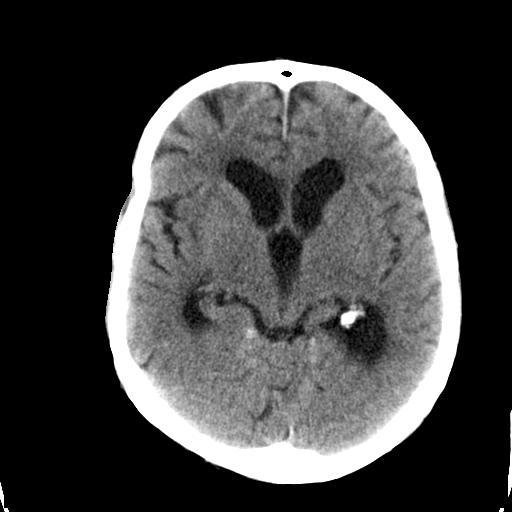
[im 19/31  brain]
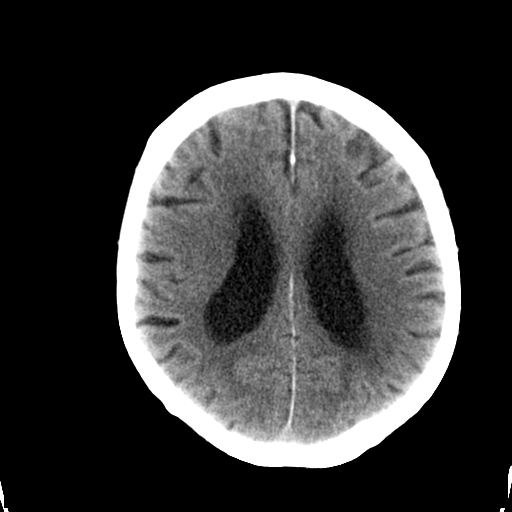
[im 25/31  brain]
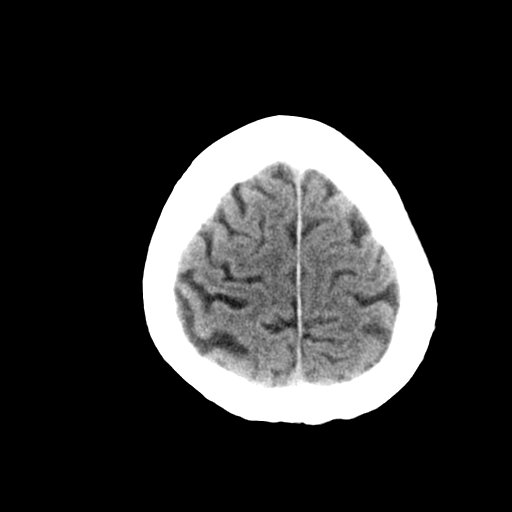

[Series 4: head wo recon · axial · 0.43mm/px · z∈[-156,-72]mm · 4 of 29 slices shown, 5 images]
[im 6/29  brain]
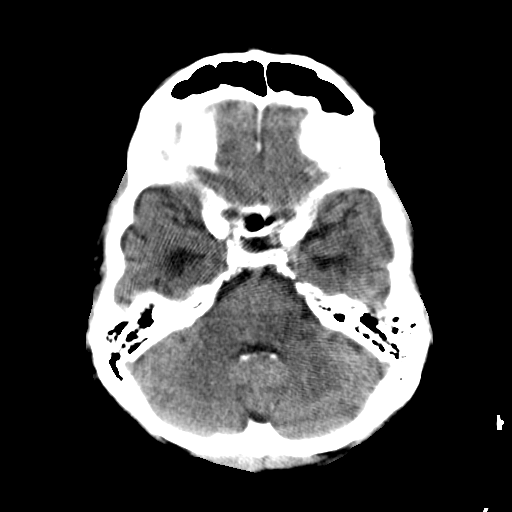
[im 6/29  bone]
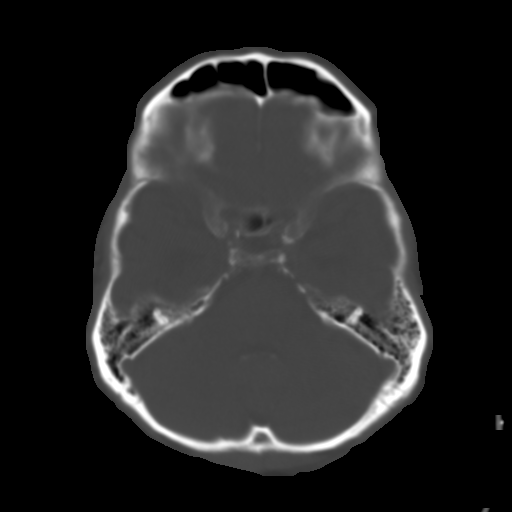
[im 12/29  brain]
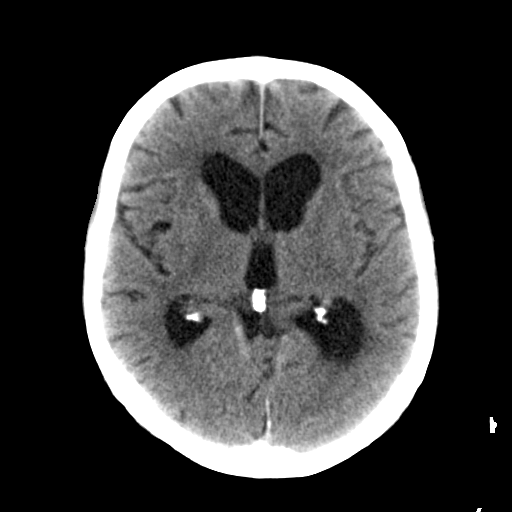
[im 17/29  brain]
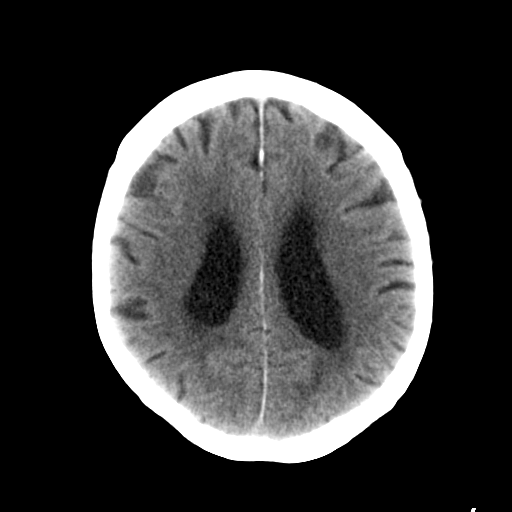
[im 23/29  brain]
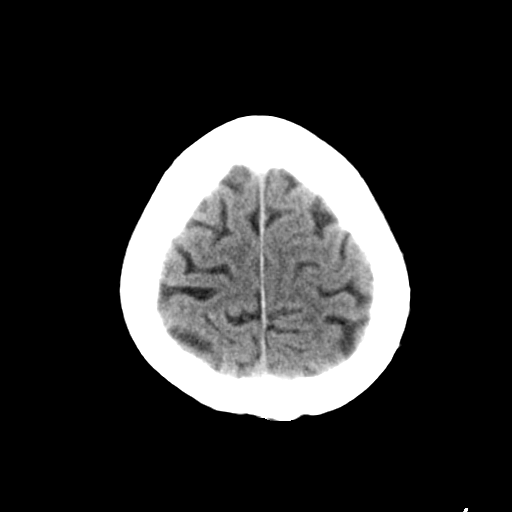

[16 of 30 positions shown; findings below may reference images not displayed]

FINDINGS: Study degraded by motion artifact.

Diffuse prominence of the CSF containing spaces compatible with
generalized age-related cerebral atrophy. Patchy hypodensity within
the periventricular and deep white matter most consistent with
chronic small vessel ischemic changes.

Scalp soft tissues within normal limits. No acute abnormality about
the orbits.

No extra-axial fluid collection. No acute intracranial hemorrhage.
No evidence for large vessel territory infarct. No mass lesion or
midline shift. Ventricular prominence related global parenchymal
volume loss without hydrocephalus.

Paranasal sinuses are well pneumatized.  No mastoid effusion.

Calvarium intact.
IMPRESSION: 1. No acute intracranial process.
2. Age-related cerebral atrophy with mild to moderate chronic small
vessel ischemic disease.

## 2017-12-30 IMAGING — DX DG HIP (WITH OR WITHOUT PELVIS) 2-3V*L*
1 series · 1 of 1 positions shown · non-contrast
Comparison: Earlier the same day.

CLINICAL DATA: Status post left hip reduction.

EXAM:
DG HIP (WITH OR WITHOUT PELVIS) 2-3V LEFT

[hip ap]
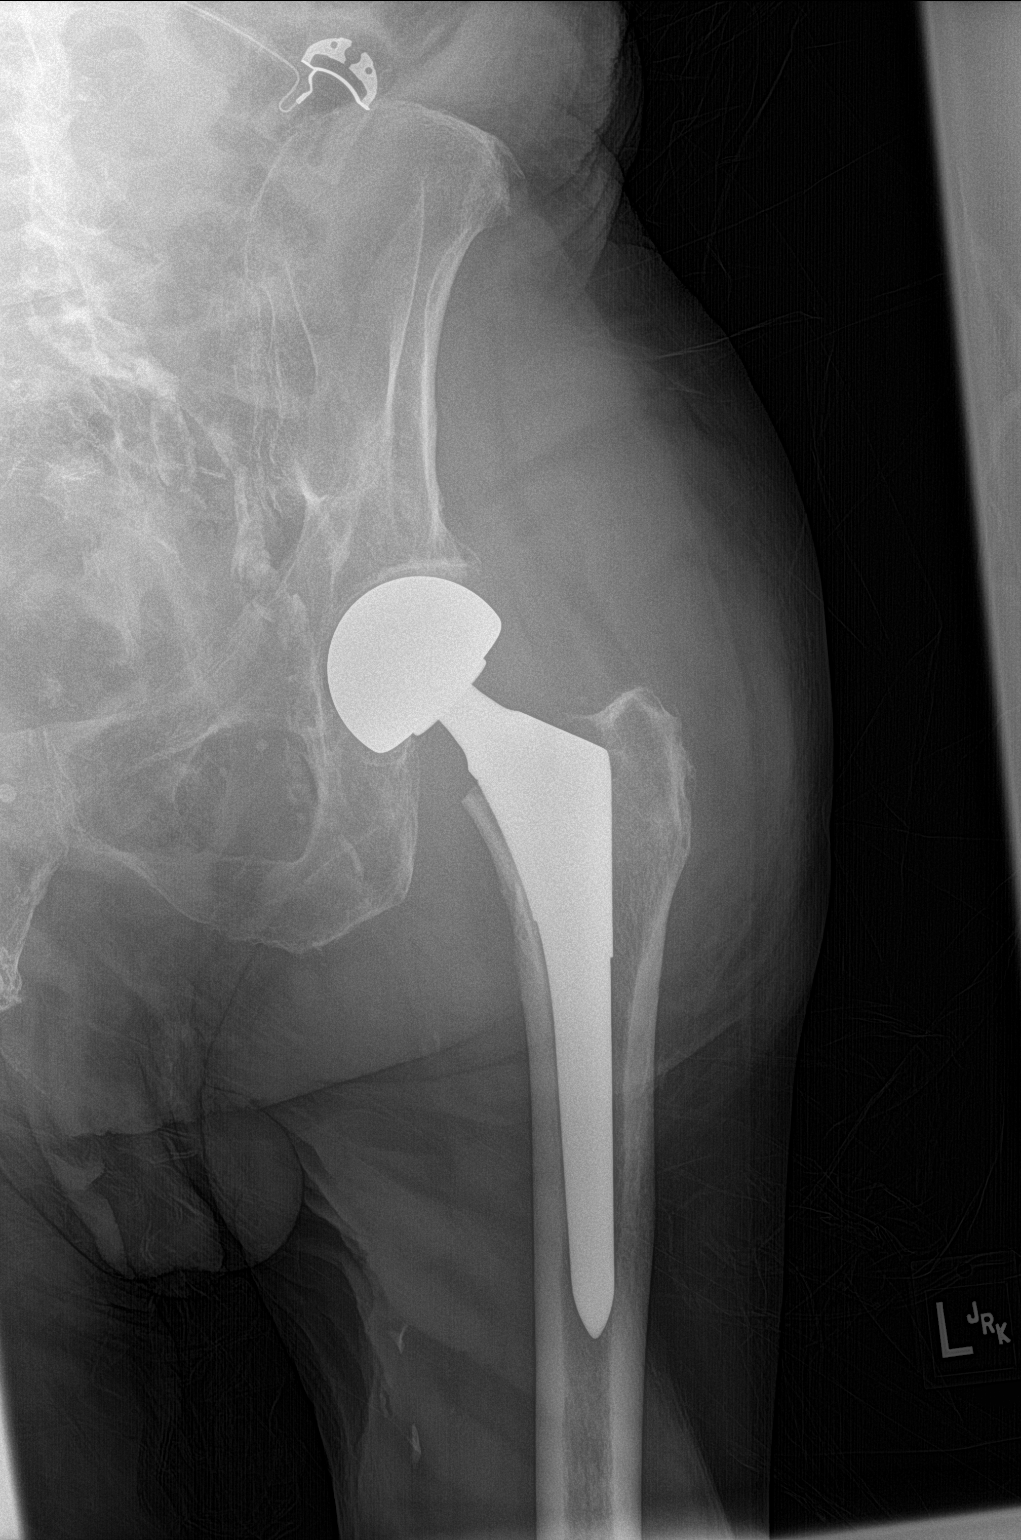

[1 of 1 positions shown; findings below may reference images not displayed]

FINDINGS: A single frontal view of the left hip shows interval reduction of
the femoral component dislocation seen previously. No evidence for
periprosthetic fracture. Bones are diffusely demineralized.
IMPRESSION: Left hip dislocation seen previously has been reduced in the
interval.
